# Patient Record
Sex: Female | Born: 1940
Health system: Southern US, Community
[De-identification: ages and names within clinical notes are randomized; demographics above are authoritative.]

## PROBLEM LIST (undated history)

## (undated) DIAGNOSIS — E785 Hyperlipidemia, unspecified: Secondary | ICD-10-CM

## (undated) DIAGNOSIS — Z8619 Personal history of other infectious and parasitic diseases: Secondary | ICD-10-CM

## (undated) DIAGNOSIS — M8580 Other specified disorders of bone density and structure, unspecified site: Secondary | ICD-10-CM

## (undated) DIAGNOSIS — D649 Anemia, unspecified: Secondary | ICD-10-CM

## (undated) DIAGNOSIS — M1990 Unspecified osteoarthritis, unspecified site: Secondary | ICD-10-CM

## (undated) DIAGNOSIS — C801 Malignant (primary) neoplasm, unspecified: Secondary | ICD-10-CM

## (undated) DIAGNOSIS — I10 Essential (primary) hypertension: Secondary | ICD-10-CM

## (undated) DIAGNOSIS — F32A Depression, unspecified: Secondary | ICD-10-CM

## (undated) DIAGNOSIS — F329 Major depressive disorder, single episode, unspecified: Secondary | ICD-10-CM

## (undated) DIAGNOSIS — Z972 Presence of dental prosthetic device (complete) (partial): Secondary | ICD-10-CM

## (undated) DIAGNOSIS — Z8541 Personal history of malignant neoplasm of cervix uteri: Secondary | ICD-10-CM

## (undated) HISTORY — PX: OTHER SURGICAL HISTORY: SHX169

## (undated) HISTORY — DX: Other specified disorders of bone density and structure, unspecified site: M85.80

## (undated) HISTORY — PX: ROTATOR CUFF REPAIR: SHX139

## (undated) HISTORY — PX: COLONOSCOPY: SHX174

## (undated) HISTORY — PX: REPLACEMENT TOTAL KNEE: SUR1224

## (undated) HISTORY — DX: Essential (primary) hypertension: I10

## (undated) HISTORY — DX: Unspecified osteoarthritis, unspecified site: M19.90

## (undated) HISTORY — PX: OOPHORECTOMY: SHX86

## (undated) HISTORY — DX: Personal history of malignant neoplasm of cervix uteri: Z85.41

## (undated) HISTORY — DX: Major depressive disorder, single episode, unspecified: F32.9

## (undated) HISTORY — DX: Personal history of other infectious and parasitic diseases: Z86.19

## (undated) HISTORY — DX: Hyperlipidemia, unspecified: E78.5

## (undated) HISTORY — DX: Depression, unspecified: F32.A

---

## 1975-07-04 HISTORY — PX: APPENDECTOMY: SHX54

## 1975-07-04 HISTORY — PX: ABDOMINAL HYSTERECTOMY: SHX81

## 2001-03-05 ENCOUNTER — Other Ambulatory Visit: Admission: RE | Admit: 2001-03-05 | Discharge: 2001-03-05 | Payer: Self-pay | Admitting: Family Medicine

## 2005-09-27 ENCOUNTER — Ambulatory Visit: Payer: Self-pay | Admitting: Family Medicine

## 2006-10-01 ENCOUNTER — Ambulatory Visit: Payer: Self-pay | Admitting: Family Medicine

## 2007-05-21 ENCOUNTER — Ambulatory Visit: Payer: Self-pay | Admitting: Rheumatology

## 2007-10-29 ENCOUNTER — Ambulatory Visit: Payer: Self-pay | Admitting: Family Medicine

## 2008-10-29 DIAGNOSIS — E663 Overweight: Secondary | ICD-10-CM | POA: Insufficient documentation

## 2008-11-24 ENCOUNTER — Ambulatory Visit: Payer: Self-pay | Admitting: Family Medicine

## 2008-11-24 DIAGNOSIS — M810 Age-related osteoporosis without current pathological fracture: Secondary | ICD-10-CM | POA: Insufficient documentation

## 2008-12-01 ENCOUNTER — Ambulatory Visit: Payer: Self-pay | Admitting: Family Medicine

## 2009-12-15 ENCOUNTER — Ambulatory Visit: Payer: Self-pay | Admitting: Family Medicine

## 2011-01-24 ENCOUNTER — Ambulatory Visit: Payer: Self-pay | Admitting: Family Medicine

## 2011-01-24 LAB — HM DEXA SCAN: HM DEXA SCAN: NORMAL

## 2012-01-29 ENCOUNTER — Ambulatory Visit: Payer: Self-pay | Admitting: Family Medicine

## 2013-02-04 ENCOUNTER — Ambulatory Visit: Payer: Self-pay | Admitting: Family Medicine

## 2013-11-06 DIAGNOSIS — Z9889 Other specified postprocedural states: Secondary | ICD-10-CM | POA: Insufficient documentation

## 2013-11-06 DIAGNOSIS — M1990 Unspecified osteoarthritis, unspecified site: Secondary | ICD-10-CM | POA: Insufficient documentation

## 2014-03-12 LAB — LIPID PANEL
CHOLESTEROL: 188 mg/dL (ref 0–200)
HDL: 57 mg/dL (ref 35–70)
LDL CALC: 102 mg/dL
TRIGLYCERIDES: 143 mg/dL (ref 40–160)

## 2014-03-12 LAB — BASIC METABOLIC PANEL
BUN: 8 mg/dL (ref 4–21)
Creatinine: 0.8 mg/dL (ref 0.5–1.1)
Glucose: 95 mg/dL
Potassium: 4.2 mmol/L (ref 3.4–5.3)
Sodium: 145 mmol/L (ref 137–147)

## 2014-04-06 ENCOUNTER — Ambulatory Visit: Payer: Self-pay | Admitting: Unknown Physician Specialty

## 2014-04-06 LAB — HM COLONOSCOPY

## 2014-04-14 ENCOUNTER — Ambulatory Visit: Payer: Self-pay | Admitting: Family Medicine

## 2014-04-14 LAB — HM MAMMOGRAPHY

## 2014-04-28 ENCOUNTER — Ambulatory Visit: Payer: Self-pay | Admitting: Family Medicine

## 2014-08-26 DIAGNOSIS — J069 Acute upper respiratory infection, unspecified: Secondary | ICD-10-CM | POA: Diagnosis not present

## 2014-11-05 DIAGNOSIS — M150 Primary generalized (osteo)arthritis: Secondary | ICD-10-CM | POA: Diagnosis not present

## 2014-11-05 DIAGNOSIS — G8929 Other chronic pain: Secondary | ICD-10-CM | POA: Diagnosis not present

## 2014-11-05 DIAGNOSIS — M25561 Pain in right knee: Secondary | ICD-10-CM | POA: Diagnosis not present

## 2014-12-17 DIAGNOSIS — L0201 Cutaneous abscess of face: Secondary | ICD-10-CM | POA: Diagnosis not present

## 2014-12-23 DIAGNOSIS — M25562 Pain in left knee: Secondary | ICD-10-CM | POA: Diagnosis not present

## 2014-12-23 DIAGNOSIS — M150 Primary generalized (osteo)arthritis: Secondary | ICD-10-CM | POA: Diagnosis not present

## 2014-12-23 DIAGNOSIS — G8929 Other chronic pain: Secondary | ICD-10-CM | POA: Diagnosis not present

## 2014-12-29 DIAGNOSIS — F32A Depression, unspecified: Secondary | ICD-10-CM | POA: Insufficient documentation

## 2014-12-29 DIAGNOSIS — Z8541 Personal history of malignant neoplasm of cervix uteri: Secondary | ICD-10-CM | POA: Insufficient documentation

## 2014-12-29 DIAGNOSIS — R5383 Other fatigue: Secondary | ICD-10-CM | POA: Insufficient documentation

## 2014-12-29 DIAGNOSIS — F329 Major depressive disorder, single episode, unspecified: Secondary | ICD-10-CM | POA: Insufficient documentation

## 2014-12-29 DIAGNOSIS — E785 Hyperlipidemia, unspecified: Secondary | ICD-10-CM | POA: Insufficient documentation

## 2014-12-29 DIAGNOSIS — N611 Abscess of the breast and nipple: Secondary | ICD-10-CM | POA: Insufficient documentation

## 2014-12-29 HISTORY — DX: Personal history of malignant neoplasm of cervix uteri: Z85.41

## 2014-12-30 ENCOUNTER — Encounter: Payer: Self-pay | Admitting: Family Medicine

## 2014-12-30 ENCOUNTER — Ambulatory Visit (INDEPENDENT_AMBULATORY_CARE_PROVIDER_SITE_OTHER): Payer: Medicare Other | Admitting: Family Medicine

## 2014-12-30 VITALS — BP 146/80 | HR 82 | Temp 98.4°F | Resp 16 | Wt 217.0 lb

## 2014-12-30 DIAGNOSIS — F419 Anxiety disorder, unspecified: Secondary | ICD-10-CM

## 2014-12-30 DIAGNOSIS — G4700 Insomnia, unspecified: Secondary | ICD-10-CM | POA: Diagnosis not present

## 2014-12-30 DIAGNOSIS — F329 Major depressive disorder, single episode, unspecified: Secondary | ICD-10-CM | POA: Diagnosis not present

## 2014-12-30 DIAGNOSIS — F32A Depression, unspecified: Secondary | ICD-10-CM

## 2014-12-30 MED ORDER — TRAZODONE HCL 100 MG PO TABS: ORAL_TABLET | ORAL | Status: DC

## 2014-12-30 MED ORDER — SERTRALINE HCL 50 MG PO TABS: ORAL_TABLET | ORAL | Status: DC

## 2014-12-30 NOTE — Progress Notes (Signed)
Patient: Lynn Mccoy Female    DOB: 28-Jan-1941   74 y.o.   MRN: [id number] Visit Date: 12/30/2014  Today's Provider: [name], MD   Chief Complaint  Patient presents with  . Depression  . Anxiety   Subjective:    HPI  Depression, Follow-up  She  was last seen for this 4 months ago. Changes made at last visit include none.   She reports poor compliance with treatment. She is not having side effects. She stopped taking her Prozac several months. Helped with mood in the past, but stopped a few months ago because she said it was not working for her.  Current symptoms include: depressed mood, fatigue and insomnia She feels she is Unchanged since last visit.  She reports that she does not want to leave the house at all and will stay at home and do nothing and that is not like her. She reports that she has been this way since her sister died in 09-Jul-2023. Pt states " I just don't handle death well". She would like to try something different.   ------------------------------------------------------------------------   Insomnia She states she falls asleep readily at night, but wakes after just an hour or two an is unable to get back to sleep. Is not kept up by pain, shortness of breath, or heart racing, but can't seem to relax and clear her mind.     Previous Medications   ASPIRIN 81 MG CHEWABLE TABLET    Chew 1 tablet by mouth daily.   CALCIUM CARBONATE 1500 (600 CA) MG TABS    Take 1 tablet by mouth daily.   CHOLECALCIFEROL (VITAMIN D) 1000 UNITS TABLET    Take 1 tablet by mouth daily.   CYANOCOBALAMIN (VITAMIN B 12) 100 MCG LOZG    Take 1 tablet by mouth daily.   FLUTICASONE (FLONASE) 50 MCG/ACT NASAL SPRAY    Place 2 sprays into both nostrils daily.   LORATADINE (CLARITIN) 10 MG TABLET    Take 1 tablet by mouth daily as needed.    Review of Systems  Constitutional: Positive for fatigue.  HENT: Negative.   Eyes: Negative.   Respiratory: Negative.     Cardiovascular: Negative.   Gastrointestinal: Negative.   Endocrine: Negative.   Genitourinary: Negative.   Musculoskeletal: Positive for arthralgias.  Skin: Negative.   Allergic/Immunologic: Negative.   Neurological: Negative.   Hematological: Negative.   Psychiatric/Behavioral: Positive for sleep disturbance and dysphoric mood. The patient is nervous/anxious.        History  Substance Use Topics  . Smoking status: Never Smoker   . Smokeless tobacco: Not on file  . Alcohol Use: 0.6 oz/week    1 Standard drinks or equivalent per week     Comment: occasional use   Objective:   BP 146/80 mmHg  Pulse 82  Temp(Src) 98.4 F (36.9 C) (Oral)  Resp 16  Wt 217 lb (98.431 kg)  Physical Exam   General Appearance:    Alert, cooperative, no distress  Eyes:    PERRL, conjunctiva/corneas clear, EOM's intact       Lungs:     Clear to auscultation bilaterally, respirations unlabored  Heart:    Regular rate and rhythm  Neurologic:   Awake, alert, oriented x 3. No apparent focal neurological           defect.           Assessment & Plan:     1. Depression Inadequate response to  fluoxetine. Failed lexapro in the past.  - sertraline (ZOLOFT) 50 MG tablet; 1/2 tablet every day for 6 days, then increase to 1 tablet daily  Dispense: 30 tablet; Refill: 1  2. Insomnia  - traZODone (DESYREL) 100 MG tablet; 1/2 or 1 tablet 1 hour before bedtime  Dispense: 30 tablet; Refill: 1  3. Acute anxiety  - sertraline (ZOLOFT) 50 MG tablet; 1/2 tablet every day for 6 days, then increase to 1 tablet daily  Dispense: 30 tablet; Refill: 1   Follow up: Return in about 1 month (around 01/29/2015).      [name], MD  [facility]

## 2015-01-22 ENCOUNTER — Encounter: Payer: Self-pay | Admitting: Family Medicine

## 2015-01-22 ENCOUNTER — Ambulatory Visit (INDEPENDENT_AMBULATORY_CARE_PROVIDER_SITE_OTHER): Payer: Medicare Other | Admitting: Family Medicine

## 2015-01-22 VITALS — BP 130/70 | HR 68 | Temp 97.9°F | Resp 18 | Wt 210.0 lb

## 2015-01-22 DIAGNOSIS — F329 Major depressive disorder, single episode, unspecified: Secondary | ICD-10-CM | POA: Diagnosis not present

## 2015-01-22 DIAGNOSIS — J309 Allergic rhinitis, unspecified: Secondary | ICD-10-CM | POA: Insufficient documentation

## 2015-01-22 DIAGNOSIS — G4700 Insomnia, unspecified: Secondary | ICD-10-CM | POA: Diagnosis not present

## 2015-01-22 DIAGNOSIS — F32A Depression, unspecified: Secondary | ICD-10-CM

## 2015-01-22 DIAGNOSIS — F419 Anxiety disorder, unspecified: Secondary | ICD-10-CM

## 2015-01-22 MED ORDER — MONTELUKAST SODIUM 10 MG PO TABS: 10.0000 mg | ORAL_TABLET | Freq: Every day | ORAL | Status: DC

## 2015-01-22 MED ORDER — TRAZODONE HCL 100 MG PO TABS: ORAL_TABLET | ORAL | Status: DC

## 2015-01-22 MED ORDER — SERTRALINE HCL 50 MG PO TABS: 50.0000 mg | ORAL_TABLET | Freq: Every day | ORAL | Status: DC

## 2015-01-22 NOTE — Progress Notes (Signed)
Patient: Lynn Mccoy Female    DOB: 17-Jun-1941   74 y.o.   MRN: [id number] Visit Date: 01/22/2015  Today's Provider: [name], MD   Chief Complaint  Patient presents with  . Depression    follow up  . Anxiety    follow up  . Insomnia    follow up   Subjective:    Anxiety The problem has been gradually improving. Symptoms include dry mouth, insomnia, malaise, nervous/anxious behavior and restlessness (fidgity). Patient reports no chest pain, compulsions, confusion, decreased concentration, dizziness, excessive worry, feeling of choking, hyperventilation, irritability, nausea, palpitations, panic, shortness of breath or suicidal ideas. Depressed mood: improved since last visit. The quality of sleep is good.      Depression Follow up She was prescribed sertraline at her last visit a month ago for depression and anxiety. She states her mood has been much better, is less anxious. She is still somewhat fatigued during the day, but improved.    Insomnia  She presents today for evaluation of insomnia. Onset was 1 months ago. Insomnia is getting better.  She does not have difficulty FALLING asleep. She does not have difficulty STAYING asleep. She does not wake frequently to urinate. She does not have urge to move legs when resting. She is not having pain when trying to sleep  She is having anxiety. She is having a lot of stress in her life. . She is having depression.   She is not taking OTC sleeping aid. . She is taking medications to help sleep. She is not drinking alcohol to help sleep. She is not using illicit drugs. Patient was started on Trazodone during the last office visit to help with Insomnia. Patient reports good compliance with treatment, good tolerance and good symptom control. Patient has been taking 1/2 tablet of Trazodone at bedtime.  --------------------------------------------------------------------   Allergies She states she  recently went on a cruise and ever since she has stayed stuffed up with watery eyes and nasal congestion. She has been using Flonase which did not control symptoms. She started taking generic of Claritin D which has helped, but she has been feeling 'fidgety.'     No Known Allergies Previous Medications   ASPIRIN 81 MG CHEWABLE TABLET    Chew 1 tablet by mouth daily.   CALCIUM CARBONATE 1500 (600 CA) MG TABS    Take 1 tablet by mouth daily.   CHOLECALCIFEROL (VITAMIN D) 1000 UNITS TABLET    Take 1 tablet by mouth daily.   CYANOCOBALAMIN (VITAMIN B 12) 100 MCG LOZG    Take 1 tablet by mouth daily.   FLUTICASONE (FLONASE) 50 MCG/ACT NASAL SPRAY    Place 2 sprays into both nostrils daily.   LORATADINE (CLARITIN) 10 MG TABLET    Take 1 tablet by mouth daily as needed.   SERTRALINE (ZOLOFT) 50 MG TABLET    1/2 tablet every day for 6 days, then increase to 1 tablet daily   TRAZODONE (DESYREL) 100 MG TABLET    1/2 or 1 tablet 1 hour before bedtime    Review of Systems  Constitutional: Positive for fatigue. Negative for fever, chills and irritability.  HENT: Positive for congestion, postnasal drip and sneezing. Negative for ear pain, sinus pressure, sore throat and trouble swallowing.   Respiratory: Negative for cough, chest tightness and shortness of breath.   Cardiovascular: Negative for chest pain and palpitations.  Gastrointestinal: Negative for nausea.  Neurological: Negative for dizziness.  Psychiatric/Behavioral: Negative for  suicidal ideas, confusion and decreased concentration. The patient is nervous/anxious and has insomnia.     History  Substance Use Topics  . Smoking status: Never Smoker   . Smokeless tobacco: Not on file  . Alcohol Use: 0.6 oz/week    1 Standard drinks or equivalent per week     Comment: occasional use   Objective:   BP 130/70 mmHg  Pulse 68  Temp(Src) 97.9 F (36.6 C) (Oral)  Resp 18  Wt 210 lb (95.255 kg)  Physical Exam  General Appearance:    Alert,  cooperative, no distress  HENT:   bilateral TM normal without fluid or infection, sinuses nontender and post nasal drip noted  Eyes:    PERRL, conjunctiva/corneas clear, EOM's intact       Lungs:     Clear to auscultation bilaterally, respirations unlabored  Heart:    Regular rate and rhythm  Neurologic:   Awake, alert, oriented x 3. No apparent focal neurological           defect.           Assessment & Plan:      1. Depression Much better since starting sertraline last month. Continue current medications.   - sertraline (ZOLOFT) 50 MG tablet; Take 1 tablet (50 mg total) by mouth daily.  Dispense: 30 tablet; Refill: 5  2. Insomnia Trazodone working well - traZODone (DESYREL) 100 MG tablet; 1/2 or 1 tablet 1 hour before bedtime  Dispense: 30 tablet; Refill: 3  3. Allergic rhinitis, unspecified allergic rhinitis type Counseled that OTC oral decongestants can cause anxiety, nervousness, and may cause BP and heart problems. Will try generic singulair instead.   4. Acute anxiety Doing well on sertraline.  - sertraline (ZOLOFT) 50 MG tablet; Take 1 tablet (50 mg total) by mouth daily.  Dispense: 30 tablet; Refill: 5  Return in about 4 months (around 05/25/2015) for Yearly Physical.       [name], MD  [facility]

## 2015-03-24 ENCOUNTER — Ambulatory Visit (INDEPENDENT_AMBULATORY_CARE_PROVIDER_SITE_OTHER): Payer: Medicare Other | Admitting: Family Medicine

## 2015-03-24 ENCOUNTER — Other Ambulatory Visit: Payer: Self-pay | Admitting: Family Medicine

## 2015-03-24 ENCOUNTER — Encounter: Payer: Self-pay | Admitting: Family Medicine

## 2015-03-24 VITALS — BP 140/62 | HR 76 | Temp 98.2°F | Resp 16 | Ht 65.5 in | Wt 211.0 lb

## 2015-03-24 DIAGNOSIS — Z0000 Encounter for general adult medical examination without abnormal findings: Secondary | ICD-10-CM

## 2015-03-24 DIAGNOSIS — Z13220 Encounter for screening for lipoid disorders: Secondary | ICD-10-CM

## 2015-03-24 DIAGNOSIS — F32A Depression, unspecified: Secondary | ICD-10-CM

## 2015-03-24 DIAGNOSIS — F329 Major depressive disorder, single episode, unspecified: Secondary | ICD-10-CM | POA: Diagnosis not present

## 2015-03-24 DIAGNOSIS — Z1231 Encounter for screening mammogram for malignant neoplasm of breast: Secondary | ICD-10-CM

## 2015-03-24 NOTE — Patient Instructions (Signed)
   Please contact your eyecare professional to schedule a routine eye exam  

## 2015-03-24 NOTE — Progress Notes (Signed)
Patient: Lynn Mccoy, Female    DOB: Nov 19, 1940, 74 y.o.   MRN: [id number] Visit Date: 03/24/2015  Today's Lynn Mccoy: Lynn Huh, MD   Chief Complaint  Patient presents with  . Annual Exam  . Hyperlipidemia    follow up  . Depression    follow up  . Insomnia    follow up   Subjective:    Annual wellness visit Lynn Mccoy is a 74 y.o. female. She feels fairly well. She reports exercising daily. She reports she is sleeping fairly well.  -----------------------------------------------------------  Lipid/Cholesterol, Follow-up:   Last seen for this1 years ago.  Management changes since that visit include  none. . Last Lipid Panel:    Component Value Date/Time   CHOL 188 03/12/2014   TRIG 143 03/12/2014   HDL 57 03/12/2014   LDLCALC 102 03/12/2014    Risk factors for vascular disease include hypercholesterolemia  She reports good compliance with treatment. She is not having side effects.  Current symptoms include none and have been stable. Weight trend: stable Prior visit with dietician: no Current diet: in general, an "unhealthy" diet Current exercise: daily  Wt Readings from Last 3 Encounters:  01/22/15 210 lb (95.255 kg)  12/30/14 217 lb (98.431 kg)  08/26/14 214 lb (97.07 kg)    ------------------------------------------------------------------- Osteopenia Follow up: Last office visit was 1 year ago. During that visit labs were ordered and were normal. Bone Density test was also ordered. Patient currently takes Vitamin D 1,000 units daily. Patient stopped taking Calcium because she read that it could cause Alzheimer's Disease.   Depression follow up: Last office visit was 2 months ago and no changes were made. Patient reports good compliance with treatment, good tolerance and good symptom control.   Review of Systems  Constitutional: Negative for fever, chills and fatigue.  HENT: Negative for congestion, ear pain, rhinorrhea,  sneezing and sore throat.   Eyes: Negative.  Negative for pain and redness.  Respiratory: Negative for cough, shortness of breath and wheezing.   Cardiovascular: Positive for leg swelling (left leg). Negative for chest pain.  Gastrointestinal: Negative for nausea, abdominal pain, diarrhea, constipation and blood in stool.  Endocrine: Negative for polydipsia and polyphagia.  Genitourinary: Negative.  Negative for dysuria, hematuria, flank pain, vaginal bleeding, vaginal discharge and pelvic pain.  Musculoskeletal: Positive for arthralgias. Negative for back pain, joint swelling and gait problem.  Skin: Negative for rash.  Neurological: Negative.  Negative for dizziness, tremors, seizures, weakness, light-headedness, numbness and headaches.  Hematological: Negative for adenopathy.  Psychiatric/Behavioral: Negative.  Negative for behavioral problems, confusion and dysphoric mood. The patient is not nervous/anxious and is not hyperactive.     Social History   Social History  . Marital Status: Widowed    Spouse Name: N/A  . Number of Children: 3  . Years of Education: 12   Occupational History  . Retired     former Haematologist   Social History Main Topics  . Smoking status: Never Smoker   . Smokeless tobacco: Not on file  . Alcohol Use: 0.6 oz/week    1 Standard drinks or equivalent per week     Comment: occasional use  . Drug Use: No  . Sexual Activity: Not on file   Other Topics Concern  . Not on file   Social History Narrative    Patient Active Problem List   Diagnosis Date Noted  . Allergic rhinitis 01/22/2015  . Depression 12/29/2014  . Fatigue 12/29/2014  .  Hyperlipidemia 12/29/2014  . Insomnia 12/06/2009  . Adjustment disorder with depressed mood 01/13/2009  . Osteopenia 11/24/2008  . Diverticulosis of colon without hemorrhage 10/29/2008  . Obesity 10/29/2008  . Osteoarthrosis 10/29/2008    Past Surgical History  Procedure Laterality Date  . Abdominal  hysterectomy  1977    due to cervical cancer stage 4  . Appendectomy  1977    Her family history includes Alzheimer's disease in her sister; COPD in her sister; Dementia in her sister; Heart attack in her brother; Leukemia in her mother; Lung cancer in her father; Rheum arthritis in her sister.    Previous Medications   ASPIRIN 81 MG CHEWABLE TABLET    Chew 1 tablet by mouth daily.   CALCIUM CARBONATE 1500 (600 CA) MG TABS    Take 1 tablet by mouth daily.   CHOLECALCIFEROL (VITAMIN D) 1000 UNITS TABLET    Take 1 tablet by mouth daily.   CYANOCOBALAMIN (VITAMIN B 12) 100 MCG LOZG    Take 1 tablet by mouth daily.   FLUTICASONE (FLONASE) 50 MCG/ACT NASAL SPRAY    Place 2 sprays into both nostrils daily.   LORATADINE (CLARITIN) 10 MG TABLET    Take 1 tablet by mouth daily as needed.   MONTELUKAST (SINGULAIR) 10 MG TABLET    Take 1 tablet (10 mg total) by mouth at bedtime.   SERTRALINE (ZOLOFT) 50 MG TABLET    Take 1 tablet (50 mg total) by mouth daily.   TRAZODONE (DESYREL) 100 MG TABLET    1/2 or 1 tablet 1 hour before bedtime    Patient Care Team: Birdie Sons, MD as PCP - General (Family Medicine) Leanor Kail, MD (Unknown Physician Specialty)     Objective:   Vitals: BP 140/62 mmHg  Pulse 76  Temp(Src) 98.2 F (36.8 C) (Oral)  Resp 16  Ht 5' 5.5" (1.664 m)  Wt 211 lb (95.709 kg)  BMI 34.57 kg/m2  Physical Exam   General Appearance:    Alert, cooperative, no distress, appears stated age  Head:    Normocephalic, without obvious abnormality, atraumatic  Eyes:    PERRL, conjunctiva/corneas clear, EOM's intact, fundi    benign, both eyes  Ears:    Normal TM's and external ear canals, both ears  Nose:   Nares normal, septum midline, mucosa normal, no drainage    or sinus tenderness  Throat:   Lips, mucosa, and tongue normal; teeth and gums normal  Neck:   Supple, symmetrical, trachea midline, no adenopathy;    thyroid:  no enlargement/tenderness/nodules; no carotid    bruit or JVD  Back:     Symmetric, no curvature, ROM normal, no CVA tenderness  Lungs:     Clear to auscultation bilaterally, respirations unlabored  Chest Wall:    No tenderness or deformity   Heart:    Regular rate and rhythm, S1 and S2 normal, no murmur, rub   or gallop  Breast Exam:    normal appearance, no masses or tenderness, deferred  Abdomen:     Soft, non-tender, bowel sounds active all four quadrants,    no masses, no organomegaly  Pelvic:    deferred  Extremities:   Extremities normal, atraumatic, no cyanosis or edema  Pulses:   2+ and symmetric all extremities  Skin:   Skin color, texture, turgor normal, no rashes or lesions  Lymph nodes:   Cervical, supraclavicular, and axillary nodes normal  Neurologic:   CNII-XII intact, normal strength, sensation and reflexes  throughout    Activities of Daily Living In your present state of health, do you have any difficulty performing the following activities: 03/24/2015  Hearing? N  Vision? N  Difficulty concentrating or making decisions? N  Walking or climbing stairs? Y  Dressing or bathing? N  Doing errands, shopping? N    Fall Risk Assessment Fall Risk  03/24/2015  Falls in the past year? No     Depression Screen PHQ 2/9 Scores 03/24/2015  PHQ - 2 Score 0  PHQ- 9 Score 3    Cognitive Testing - 6-CIT  Correct? Score   What year is it? yes 0 0 or 4  What month is it? yes 0 0 or 3  Memorize:    Pia Mau,  41,  [place],      What time is it? (within 1 hour) yes 0 0 or 3  Count backwards from 20 yes 0 0, 2, or 4  Name the months of the year yes 0 0, 2, or 4  Repeat name & address above no 8 0, 2, 4, 6, 8, or 10       TOTAL SCORE  8/28   Interpretation:  Abnormal- Mild Cognitive Impairment (Consider Referral)  Normal (0-7) Abnormal (8-28)     Audit-C Alcohol Use Screening  Question Answer Points  How often do you have alcoholic drink? 1 times monthly 1  How many drinks do you typically consume  in a day? 0 0  How oftey will you drink 6 or more in a total? never 0  Total Score:  0   A score of 3 or more in women, and 4 or more in men indicates increased risk for alcohol abuse, EXCEPT if all of the points are from question 1.   Assessment & Plan:     Annual exam  Reviewed patient's Family Medical History Reviewed and updated list of patient's medical providers Assessment of cognitive impairment was done Assessed patient's functional ability Established a written schedule for health screening [place] Completed and Reviewed  Exercise Activities and Dietary recommendations Goals    None      Immunization History  Administered Date(s) Administered  . Pneumococcal Conjugate-13 03/12/2014  . Pneumococcal Polysaccharide-23 03/22/2005  . Td 01/19/1997, 11/23/2008  . Zoster 10/28/2007    Health Maintenance  Topic Date Due  . INFLUENZA VACCINE  10/02/2015 (Originally 02/01/2015)  . TETANUS/TDAP  11/24/2018  . COLONOSCOPY  04/06/2024  . DEXA SCAN  Completed  . ZOSTAVAX  Completed  . PNA vac Low Risk Adult  Completed      Discussed health benefits of physical activity, and encouraged her to engage in regular exercise appropriate for her age and condition.    ------------------------------------------------------------------------------------------------------------  1. Annual physical exam Generally doing well. May have mild cognitive impairment. Will monitor periodically.   She declined flu vaccine  2. Lipid screening  - Lipid Panel With LDL/HDL Ratio  3. Depression Doing well on sertraline.  - Basic Metabolic Panel (7)

## 2015-03-25 LAB — LIPID PANEL WITH LDL/HDL RATIO
CHOLESTEROL TOTAL: 169 mg/dL (ref 100–199)
HDL: 56 mg/dL (ref >39)
LDL CALC: 94 mg/dL (ref 0–99)
LDl/HDL Ratio: 1.7 ratio units (ref 0.0–3.2)
Triglycerides: 95 mg/dL (ref 0–149)
VLDL Cholesterol Cal: 19 mg/dL (ref 5–40)

## 2015-03-25 LAB — BASIC METABOLIC PANEL (7)
BUN/Creatinine Ratio: 20 (ref 11–26)
BUN: 14 mg/dL (ref 8–27)
CHLORIDE: 102 mmol/L (ref 97–108)
CO2: 25 mmol/L (ref 18–29)
Creatinine, Ser: 0.71 mg/dL (ref 0.57–1.00)
GFR calc Af Amer: 96 mL/min/{1.73_m2} (ref >59)
GFR, EST NON AFRICAN AMERICAN: 84 mL/min/{1.73_m2} (ref >59)
Glucose: 90 mg/dL (ref 65–99)
Potassium: 4.4 mmol/L (ref 3.5–5.2)
Sodium: 143 mmol/L (ref 134–144)

## 2015-03-26 ENCOUNTER — Other Ambulatory Visit: Payer: Self-pay | Admitting: Family Medicine

## 2015-04-16 ENCOUNTER — Other Ambulatory Visit: Payer: Self-pay | Admitting: Family Medicine

## 2015-04-16 ENCOUNTER — Ambulatory Visit
Admission: RE | Admit: 2015-04-16 | Discharge: 2015-04-16 | Disposition: A | Discharge status: Home / Self Care | Payer: Medicare Other | Source: Ambulatory Visit | Attending: Family Medicine | Admitting: Family Medicine

## 2015-04-16 DIAGNOSIS — Z1231 Encounter for screening mammogram for malignant neoplasm of breast: Secondary | ICD-10-CM | POA: Insufficient documentation

## 2015-04-16 DIAGNOSIS — Z1239 Encounter for other screening for malignant neoplasm of breast: Secondary | ICD-10-CM

## 2015-04-16 HISTORY — DX: Malignant (primary) neoplasm, unspecified: C80.1

## 2015-04-29 ENCOUNTER — Ambulatory Visit (INDEPENDENT_AMBULATORY_CARE_PROVIDER_SITE_OTHER): Payer: Medicare Other | Admitting: Physician Assistant

## 2015-04-29 ENCOUNTER — Encounter: Payer: Self-pay | Admitting: Physician Assistant

## 2015-04-29 VITALS — BP 130/72 | HR 80 | Temp 98.4°F | Resp 16 | Wt 210.8 lb

## 2015-04-29 DIAGNOSIS — L02439 Carbuncle of limb, unspecified: Secondary | ICD-10-CM | POA: Diagnosis not present

## 2015-04-29 MED ORDER — CEPHALEXIN 500 MG PO CAPS: 500.0000 mg | ORAL_CAPSULE | Freq: Two times a day (BID) | ORAL | Status: DC

## 2015-04-29 NOTE — Patient Instructions (Signed)

## 2015-04-29 NOTE — Progress Notes (Signed)
Patient: Lynn Mccoy Female    DOB: 15-Feb-1941   74 y.o.   MRN: [id number] Visit Date: 04/29/2015  Today's Provider: Mar Daring, PA-C   Chief Complaint  Patient presents with  . Knot under arm   Subjective:    HPI  Patient Lynn Mccoy is a 74 year old here concern about a knot under the left arm. Patient noticed on Monday. Patient report that is hurting, hot to touch. No fever, no drainage from the cyst, no cold symptoms. Per patient noticed a knot onn her left breast on Tuesday of last week, the next day patient was in the shower and noticed pus coming out from the cyst on her breast.  He was evaluated and told that the area on the left breast was an infected hair follicle. It has been healing and doing well. She did not go on any antibiotics for it.    No Known Allergies Previous Medications   ASPIRIN 81 MG CHEWABLE TABLET    Chew 1 tablet by mouth daily.   CALCIUM CARBONATE 1500 (600 CA) MG TABS    Take 1 tablet by mouth daily.   CHOLECALCIFEROL (VITAMIN D) 1000 UNITS TABLET    Take 1 tablet by mouth daily.   CYANOCOBALAMIN (VITAMIN B 12) 100 MCG LOZG    Take 1 tablet by mouth daily.   FLUTICASONE (FLONASE) 50 MCG/ACT NASAL SPRAY    Place 2 sprays into both nostrils daily.   LORATADINE (CLARITIN) 10 MG TABLET    Take 1 tablet by mouth daily as needed.   MONTELUKAST (SINGULAIR) 10 MG TABLET    TAKE ONE TABLET BY MOUTH EVERY NIGHT AT BEDTIME   SERTRALINE (ZOLOFT) 50 MG TABLET    Take 1 tablet (50 mg total) by mouth daily.   TRAZODONE (DESYREL) 100 MG TABLET    1/2 or 1 tablet 1 hour before bedtime    Review of Systems  Constitutional: Negative for fever and chills.  HENT: Negative for congestion, postnasal drip and sinus pressure.   Respiratory: Negative for cough, chest tightness and wheezing.   Cardiovascular: Negative for chest pain, palpitations and leg swelling.  Gastrointestinal: Negative for nausea and vomiting.  Musculoskeletal: Negative  for myalgias, joint swelling, neck pain and neck stiffness.  Skin: Positive for color change (erythematous at site of L axilla bump).  Neurological: Negative for dizziness, light-headedness and headaches.  Hematological: Negative.     Social History  Substance Use Topics  . Smoking status: Never Smoker   . Smokeless tobacco: Not on file  . Alcohol Use: 0.6 oz/week    1 Standard drinks or equivalent per week     Comment: occasional use   Objective:   BP 130/72 mmHg  Pulse 80  Temp(Src) 98.4 F (36.9 C) (Oral)  Resp 16  Wt 210 lb 12.8 oz (95.618 kg)  Physical Exam  Constitutional: She appears well-developed and well-nourished. No distress.  Cardiovascular: Normal rate, regular rhythm and normal heart sounds.  Exam reveals no gallop and no friction rub.   No murmur heard. Pulmonary/Chest: Effort normal and breath sounds normal. No respiratory distress. She has no wheezes. She has no rales.    Abdominal: Soft. Bowel sounds are normal. She exhibits no distension. There is no tenderness.  Skin: Skin is warm and dry. She is not diaphoretic. There is erythema (left axilla).  Vitals reviewed.       Assessment & Plan:     1. Carbuncle of  axillary fold Advised her that she may put warm compresses over the area to help with discomfort. I will prescribe Keflex as below. She is to call the office if the area does improve by the time she is completing the antibiotic. If no improvement may require I&D. - cephALEXin (KEFLEX) 500 MG capsule; Take 1 capsule (500 mg total) by mouth 2 (two) times daily.  Dispense: 20 capsule; Refill: 0       Mar Daring, PA-C  [facility]

## 2015-08-10 ENCOUNTER — Other Ambulatory Visit: Payer: Self-pay | Admitting: Family Medicine

## 2015-10-21 ENCOUNTER — Other Ambulatory Visit: Payer: Self-pay | Admitting: Family Medicine

## 2015-11-01 ENCOUNTER — Ambulatory Visit (INDEPENDENT_AMBULATORY_CARE_PROVIDER_SITE_OTHER): Payer: Medicare Other | Admitting: Family Medicine

## 2015-11-01 ENCOUNTER — Encounter: Payer: Self-pay | Admitting: Family Medicine

## 2015-11-01 VITALS — BP 140/70 | HR 82 | Temp 98.3°F | Resp 16 | Ht 65.5 in | Wt 213.0 lb

## 2015-11-01 DIAGNOSIS — J309 Allergic rhinitis, unspecified: Secondary | ICD-10-CM | POA: Diagnosis not present

## 2015-11-01 DIAGNOSIS — H1013 Acute atopic conjunctivitis, bilateral: Secondary | ICD-10-CM | POA: Diagnosis not present

## 2015-11-01 MED ORDER — AZELASTINE HCL 0.05 % OP SOLN: 1.0000 [drp] | Freq: Two times a day (BID) | OPHTHALMIC | Status: DC | PRN

## 2015-11-01 MED ORDER — FLUTICASONE PROPIONATE 50 MCG/ACT NA SUSP: 2.0000 | Freq: Every day | NASAL | Status: DC

## 2015-11-01 NOTE — Progress Notes (Signed)
Patient: Lynn Mccoy Female    DOB: 04-Jun-1941   75 y.o.   MRN: [id number] Visit Date: 11/01/2015  Today's Provider: [name], MD   Chief Complaint  Patient presents with  . Eye Drainage   Subjective:     Sinus pressure and runny eyes since Saturday. symptoms include sinus pressure, runny nose and runny eyes. Has taken otc alka-seltzer cold with moderate relief.    Sinus Problem This is a new problem. The current episode started in the past 7 days (2 days). There has been no fever. The pain is mild. Associated symptoms include congestion, coughing, ear pain, sinus pressure and sneezing. Pertinent negatives include no chills, diaphoresis, headaches, hoarse voice, neck pain, shortness of breath, sore throat or swollen glands. Treatments tried: alka seltzer cold. The treatment provided moderate relief.      No Known Allergies Previous Medications   ASPIRIN 81 MG CHEWABLE TABLET    Chew 1 tablet by mouth daily.   CALCIUM CARBONATE 1500 (600 CA) MG TABS    Take 1 tablet by mouth daily.   CHOLECALCIFEROL (VITAMIN D) 1000 UNITS TABLET    Take 1 tablet by mouth daily.   CYANOCOBALAMIN (VITAMIN B 12) 100 MCG LOZG    Take 1 tablet by mouth daily.   FLUTICASONE (FLONASE) 50 MCG/ACT NASAL SPRAY    Place 2 sprays into both nostrils daily.   LORATADINE (CLARITIN) 10 MG TABLET    Take 1 tablet by mouth daily as needed.   MONTELUKAST (SINGULAIR) 10 MG TABLET    TAKE ONE TABLET BY MOUTH EVERY NIGHT AT BEDTIME   SERTRALINE (ZOLOFT) 50 MG TABLET    Take 1 tablet (50 mg total) by mouth daily.   TRAZODONE (DESYREL) 100 MG TABLET    TAKE ONE-HALF (1/2) TO ONE (1) TABLET BY MOUTH ONE HOUR BEFORE BEDTIME    Review of Systems  Constitutional: Negative for fever, chills, diaphoresis, appetite change and fatigue.  HENT: Positive for congestion, ear pain, postnasal drip, sinus pressure and sneezing. Negative for hoarse voice and sore throat.   Eyes: Positive for discharge.  Respiratory:  Positive for cough. Negative for chest tightness and shortness of breath.   Cardiovascular: Negative for chest pain and palpitations.  Gastrointestinal: Negative for nausea, vomiting and abdominal pain.  Musculoskeletal: Negative for neck pain.  Neurological: Negative for dizziness, weakness and headaches.    Social History  Substance Use Topics  . Smoking status: Never Smoker   . Smokeless tobacco: Not on file  . Alcohol Use: 0.6 oz/week    1 Standard drinks or equivalent per week     Comment: occasional use   Objective:   BP 140/70 mmHg  Pulse 82  Temp(Src) 98.3 F (36.8 C) (Oral)  Resp 16  Ht 5' 5.5" (1.664 m)  Wt 213 lb (96.616 kg)  BMI 34.89 kg/m2  SpO2 97%  Physical Exam  General Appearance:    Alert, cooperative, no distress  HENT:   bilateral TM normal without fluid or infection, neck without nodes, pharynx erythematous without exudate and nasal mucosa pale and congested  Eyes:    PERRL, conjunctiva pale. No discharge, EOM's intact       Lungs:     Clear to auscultation bilaterally, respirations unlabored  Heart:    Regular rate and rhythm  Neurologic:   Awake, alert, oriented x 3. No apparent focal neurological           defect.  Assessment & Plan:     1. Allergic rhinitis, unspecified allergic rhinitis type  - fluticasone (FLONASE) 50 MCG/ACT nasal spray; Place 2 sprays into both nostrils daily. For allergies  Dispense: 16 g; Refill: 2  2. Allergic conjunctivitis, bilateral  - azelastine (OPTIVAR) 0.05 % ophthalmic solution; Place 1 drop into both eyes 2 (two) times daily as needed.  Dispense: 6 mL; Refill: 2   Call if symptoms change or if not rapidly improving.          [name], MD  [facility]

## 2015-12-11 ENCOUNTER — Emergency Department: Payer: Medicare Other

## 2015-12-11 ENCOUNTER — Encounter: Payer: Self-pay | Admitting: Emergency Medicine

## 2015-12-11 ENCOUNTER — Emergency Department
Admission: EM | Admit: 2015-12-11 | Discharge: 2015-12-11 | Disposition: A | Discharge status: Home / Self Care | Payer: Medicare Other | Attending: Emergency Medicine | Admitting: Emergency Medicine

## 2015-12-11 DIAGNOSIS — S0992XA Unspecified injury of nose, initial encounter: Secondary | ICD-10-CM | POA: Diagnosis present

## 2015-12-11 DIAGNOSIS — S022XXA Fracture of nasal bones, initial encounter for closed fracture: Secondary | ICD-10-CM | POA: Diagnosis not present

## 2015-12-11 DIAGNOSIS — Z7982 Long term (current) use of aspirin: Secondary | ICD-10-CM | POA: Insufficient documentation

## 2015-12-11 DIAGNOSIS — M1990 Unspecified osteoarthritis, unspecified site: Secondary | ICD-10-CM | POA: Diagnosis not present

## 2015-12-11 DIAGNOSIS — W01190A Fall on same level from slipping, tripping and stumbling with subsequent striking against furniture, initial encounter: Secondary | ICD-10-CM | POA: Diagnosis not present

## 2015-12-11 DIAGNOSIS — S0121XA Laceration without foreign body of nose, initial encounter: Secondary | ICD-10-CM

## 2015-12-11 DIAGNOSIS — Y999 Unspecified external cause status: Secondary | ICD-10-CM | POA: Insufficient documentation

## 2015-12-11 DIAGNOSIS — F329 Major depressive disorder, single episode, unspecified: Secondary | ICD-10-CM | POA: Insufficient documentation

## 2015-12-11 DIAGNOSIS — Z8543 Personal history of malignant neoplasm of ovary: Secondary | ICD-10-CM | POA: Diagnosis not present

## 2015-12-11 DIAGNOSIS — Z8541 Personal history of malignant neoplasm of cervix uteri: Secondary | ICD-10-CM | POA: Diagnosis not present

## 2015-12-11 DIAGNOSIS — Y939 Activity, unspecified: Secondary | ICD-10-CM | POA: Diagnosis not present

## 2015-12-11 DIAGNOSIS — F4321 Adjustment disorder with depressed mood: Secondary | ICD-10-CM | POA: Insufficient documentation

## 2015-12-11 DIAGNOSIS — E785 Hyperlipidemia, unspecified: Secondary | ICD-10-CM | POA: Diagnosis not present

## 2015-12-11 DIAGNOSIS — Z79899 Other long term (current) drug therapy: Secondary | ICD-10-CM | POA: Diagnosis not present

## 2015-12-11 DIAGNOSIS — Y929 Unspecified place or not applicable: Secondary | ICD-10-CM | POA: Diagnosis not present

## 2015-12-11 MED ORDER — TETANUS-DIPHTH-ACELL PERTUSSIS 5-2.5-18.5 LF-MCG/0.5 IM SUSP
0.5000 mL | Freq: Once | INTRAMUSCULAR | Status: AC
  Administered 2015-12-11: 0.5 mL via INTRAMUSCULAR
  Filled 2015-12-11: qty 0.5

## 2015-12-11 MED ORDER — LIDOCAINE HCL (PF) 1 % IJ SOLN
INTRAMUSCULAR | Status: AC
  Administered 2015-12-11: 11:00:00
  Filled 2015-12-11: qty 5

## 2015-12-11 NOTE — ED Notes (Signed)
About 2 am heard dog and tried to get up and tripped and hit nose on coffee table. Denies LOC. Bandage over bridge of nose with abrasion showing at edge.

## 2015-12-11 NOTE — Discharge Instructions (Signed)
Laceration Care, Adult °A laceration is a cut that goes through all of the layers of the skin and into the tissue that is right under the skin. Some lacerations heal on their own. Others need to be closed with stitches (sutures), staples, skin adhesive strips, or skin glue. Proper laceration care minimizes the risk of infection and helps the laceration to heal better. °HOW TO CARE FOR YOUR LACERATION °If sutures or staples were used: °· Keep the wound clean and dry. °· If you were given a bandage (dressing), you should change it at least one time per day or as told by your health care provider. You should also change it if it becomes wet or dirty. °· Keep the wound completely dry for the first 24 hours or as told by your health care provider. After that time, you may shower or bathe. However, make sure that the wound is not soaked in water until after the sutures or staples have been removed. °· Clean the wound one time each day or as told by your health care provider: °· Wash the wound with soap and water. °· Rinse the wound with water to remove all soap. °· Pat the wound dry with a clean towel. Do not rub the wound. °· After cleaning the wound, apply a thin layer of antibiotic ointment as told by your health care provider. This will help to prevent infection and keep the dressing from sticking to the wound. °· Have the sutures or staples removed as told by your health care provider. °If skin adhesive strips were used: °· Keep the wound clean and dry. °· If you were given a bandage (dressing), you should change it at least one time per day or as told by your health care provider. You should also change it if it becomes dirty or wet. °· Do not get the skin adhesive strips wet. You may shower or bathe, but be careful to keep the wound dry. °· If the wound gets wet, pat it dry with a clean towel. Do not rub the wound. °· Skin adhesive strips fall off on their own. You may trim the strips as the wound heals. Do not  remove skin adhesive strips that are still stuck to the wound. They will fall off in time. °If skin glue was used: °· Try to keep the wound dry, but you may briefly wet it in the shower or bath. Do not soak the wound in water, such as by swimming. °· After you have showered or bathed, gently pat the wound dry with a clean towel. Do not rub the wound. °· Do not do any activities that will make you sweat heavily until the skin glue has fallen off on its own. °· Do not apply liquid, cream, or ointment medicine to the wound while the skin glue is in place. Using those may loosen the film before the wound has healed. °· If you were given a bandage (dressing), you should change it at least one time per day or as told by your health care provider. You should also change it if it becomes dirty or wet. °· If a dressing is placed over the wound, be careful not to apply tape directly over the skin glue. Doing that may cause the glue to be pulled off before the wound has healed. °· Do not pick at the glue. The skin glue usually remains in place for 5-10 days, then it falls off of the skin. °General Instructions °· Take over-the-counter and prescription   medicines only as told by your health care provider. °· If you were prescribed an antibiotic medicine or ointment, take or apply it as told by your doctor. Do not stop using it even if your condition improves. °· To help prevent scarring, make sure to cover your wound with sunscreen whenever you are outside after stitches are removed, after adhesive strips are removed, or when glue remains in place and the wound is healed. Make sure to wear a sunscreen of at least 30 SPF. °· Do not scratch or pick at the wound. °· Keep all follow-up visits as told by your health care provider. This is important. °· Check your wound every day for signs of infection. Watch for: °· Redness, swelling, or pain. °· Fluid, blood, or pus. °· Raise (elevate) the injured area above the level of your heart  while you are sitting or lying down, if possible. °SEEK MEDICAL CARE IF: °· You received a tetanus shot and you have swelling, severe pain, redness, or bleeding at the injection site. °· You have a fever. °· A wound that was closed breaks open. °· You notice a bad smell coming from your wound or your dressing. °· You notice something coming out of the wound, such as wood or glass. °· Your pain is not controlled with medicine. °· You have increased redness, swelling, or pain at the site of your wound. °· You have fluid, blood, or pus coming from your wound. °· You notice a change in the color of your skin near your wound. °· You need to change the dressing frequently due to fluid, blood, or pus draining from the wound. °· You develop a new rash. °· You develop numbness around the wound. °SEEK IMMEDIATE MEDICAL CARE IF: °· You develop severe swelling around the wound. °· Your pain suddenly increases and is severe. °· You develop painful lumps near the wound or on skin that is anywhere on your body. °· You have a red streak going away from your wound. °· The wound is on your hand or foot and you cannot properly move a finger or toe. °· The wound is on your hand or foot and you notice that your fingers or toes look pale or bluish. °  °This information is not intended to replace advice given to you by your health care provider. Make sure you discuss any questions you have with your health care provider. °  °Document Released: 06/19/2005 Document Revised: 11/03/2014 Document Reviewed: 06/15/2014 °Elsevier Interactive Patient Education ©2016 [employer] ° °Stitches, Staples, or Adhesive Wound Closure °Health care providers use stitches (sutures), staples, and certain glue (skin adhesives) to hold skin together while it heals (wound closure). You may need this treatment after you have surgery or if you cut your skin accidentally. These methods help your skin to heal more quickly and make it less likely that you will have  a scar. A wound may take several months to heal completely. °The type of wound you have determines when your wound gets closed. In most cases, the wound is closed as soon as possible (primary skin closure). Sometimes, closure is delayed so the wound can be cleaned and allowed to heal naturally. This reduces the chance of infection. Delayed closure may be needed if your wound: °· Is caused by a bite. °· Happened more than 6 hours ago. °· Involves loss of skin or the tissues under the skin. °· Has dirt or debris in it that cannot be removed. °· Is infected. °WHAT   ARE THE DIFFERENT KINDS OF WOUND CLOSURES? °There are many options for wound closure. The one that your health care provider uses depends on how deep and how large your wound is. °Adhesive Glue °To use this type of glue to close a wound, your health care provider holds the edges of the wound together and paints the glue on the surface of your skin. You may need more than one layer of glue. Then the wound may be covered with a light bandage (dressing). °This type of skin closure may be used for small wounds that are not deep (superficial). Using glue for wound closure is less painful than other methods. It does not require a medicine that numbs the area (local anesthetic). This method also leaves nothing to be removed. Adhesive glue is often used for children and on facial wounds. °Adhesive glue cannot be used for wounds that are deep, uneven, or bleeding. It is not used inside of a wound.  °Adhesive Strips °These strips are made of sticky (adhesive), porous paper. They are applied across your skin edges like a regular adhesive bandage. You leave them on until they fall off. °Adhesive strips may be used to close very superficial wounds. They may also be used along with sutures to improve the closure of your skin edges.  °Sutures °Sutures are the oldest method of wound closure. Sutures can be made from natural substances, such as silk, or from synthetic  materials, such as nylon and steel. They can be made from a material that your body can break down as your wound heals (absorbable), or they can be made from a material that needs to be removed from your skin (nonabsorbable). They come in many different strengths and sizes. °Your health care provider attaches the sutures to a steel needle on one end. Sutures can be passed through your skin, or through the tissues beneath your skin. Then they are tied and cut. Your skin edges may be closed in one continuous stitch or in separate stitches. °Sutures are strong and can be used for all kinds of wounds. Absorbable sutures may be used to close tissues under the skin. The disadvantage of sutures is that they may cause skin reactions that lead to infection. Nonabsorbable sutures need to be removed. °Staples °When surgical staples are used to close a wound, the edges of your skin on both sides of the wound are brought close together. A staple is placed across the wound, and an instrument secures the edges together. Staples are often used to close surgical cuts (incisions). °Staples are faster to use than sutures, and they cause less skin reaction. Staples need to be removed using a tool that bends the staples away from your skin. °HOW DO I CARE FOR MY WOUND CLOSURE? °· Take medicines only as directed by your health care provider. °· If you were prescribed an antibiotic medicine for your wound, finish it all even if you start to feel better. °· Use ointments or creams only as directed by your health care provider. °· Wash your hands with soap and water before and after touching your wound. °· Do not soak your wound in water. Do not take baths, swim, or use a hot tub until your health care provider approves. °· Ask your health care provider when you can start showering. Cover your wound if directed by your health care provider. °· Do not take out your own sutures or staples. °· Do not pick at your wound. Picking can cause an  infection. °·   Keep all follow-up visits as directed by your health care provider. This is important. HOW LONG WILL I HAVE MY WOUND CLOSURE?  Leave adhesive glue on your skin until the glue peels away.  Leave adhesive strips on your skin until the strips fall off.  Absorbable sutures will dissolve within several days.  Nonabsorbable sutures and staples must be removed. The location of the wound will determine how long they stay in. This can range from several days to a couple of weeks. WHEN SHOULD I SEEK HELP FOR MY WOUND CLOSURE? Contact your health care provider if:  You have a fever.  You have chills.  You have drainage, redness, swelling, or pain at your wound.  There is a bad smell coming from your wound.  The skin edges of your wound start to separate after your sutures have been removed.  Your wound becomes thick, raised, and darker in color after your sutures come out (scarring).   This information is not intended to replace advice given to you by your health care provider. Make sure you discuss any questions you have with your health care provider.   Document Released: 03/14/2001 Document Revised: 07/10/2014 Document Reviewed: 11/26/2013 Elsevier Interactive Patient Education 2016 [employer]  Nasal Fracture A nasal fracture is a break or crack in the bones or cartilage of the nose. Minor breaks do not require treatment. These breaks usually heal on their own after about one month. Serious breaks may require surgery. CAUSES This injury is usually caused by a blunt injury to the nose. This type of injury often occurs from:  Contact sports.  Car accidents.  Falls.  Getting punched. SYMPTOMS Symptoms of this injury include:  Pain.  Swelling of the nose.  Bleeding from the nose.  Bruising around the nose or eyes. This may include having black eyes.  Crooked appearance of the nose. DIAGNOSIS This injury may be diagnosed with a physical exam. The health  care provider will gently feel the nose for signs of broken bones. He or she will look inside the nostrils to make sure that there is not a blood-filled swelling on the dividing wall between the nostrils (septal hematoma). X-rays of the nose may not show a nasal fracture even when one is present. In some cases, X-rays or a CT scan may be done 1-5 days after the injury. Sometimes, the health care provider will want to wait until the swelling has gone down. TREATMENT Often, minor fractures that have caused no deformity do not require treatment. More serious fractures in which bones have moved out of position may require surgery, which will take place after the swelling is gone. Surgery will stabilize and align the fracture. In some cases, a health care provider may be able to reposition the bones without surgery. This may be done in the health care provider's office after medicine is given to numb the area (local anesthetic). HOME CARE INSTRUCTIONS  If directed, apply ice to the injured area:  Put ice in a plastic bag.  Place a towel between your skin and the bag.  Leave the ice on for 20 minutes, 2-3 times per day.  Take over-the-counter and prescription medicines only as told by your health care provider.  If your nose starts to bleed, sit in an upright position while you squeeze the soft parts of your nose against the dividing wall between your nostrils (septum) for 10 minutes.  Try to avoid blowing your nose.  Return to your normal activities as told by  your health care provider. Ask your health care provider what activities are safe for you.  Avoid contact sports for 3-4 weeks or as told by your health care provider.  Keep all follow-up visits as told by your health care provider. This is important. SEEK MEDICAL CARE IF:  Your pain increases or becomes severe.  You continue to have nosebleeds.  The shape of your nose does not return to normal within 5 days.  You have pus draining  out of your nose. SEEK IMMEDIATE MEDICAL CARE IF:  You have bleeding from your nose that does not stop after you pinch your nostrils closed for 20 minutes and keep ice on your nose.  You have clear fluid draining out of your nose.  You notice a grape-like swelling on the septum. This swelling is a collection of blood (hematoma) that must be drained to help prevent infection.  You have difficulty moving your eyes.  You have repeated vomiting.   This information is not intended to replace advice given to you by your health care provider. Make sure you discuss any questions you have with your health care provider.   Document Released: 06/16/2000 Document Revised: 03/10/2015 Document Reviewed: 07/27/2014 Elsevier Interactive Patient Education [employer].

## 2015-12-11 NOTE — ED Provider Notes (Signed)
[facility] Emergency Department Provider Note  ____________________________________________  Time seen: Approximately 10:23 AM  I have reviewed the triage vital signs and the nursing notes.   HISTORY  Chief Complaint Facial Injury    HPI Lynn Mccoy is a 75 y.o. female patient who fell this morning about 2:00 which will be 8 hours ago. She tripped and hit her nose on a coffee table. Denies any loss of consciousness. Denies any headache. Denies any visual disturbances. Does have a laceration to the bridge of her nose. Pain is minimal and she is currently a 5/10. Has not taken any medication over-the-counter but has used a Band-Aid to cover up the bleeding.   Past Medical History  Diagnosis Date  . History of mumps as a child   . Depression   . Hyperlipidemia   . Osteoarthrosis   . Osteopenia   . History of cervical cancer 12/29/2014    stage 4, s/p complete hysterectomy   . Cancer [facility])     ovarian ca    Patient Active Problem List   Diagnosis Date Noted  . Allergic rhinitis 01/22/2015  . Depression 12/29/2014  . Fatigue 12/29/2014  . Arthritis, degenerative 11/06/2013  . History of repair of rotator cuff 11/06/2013  . Insomnia 12/06/2009  . Adjustment disorder with depressed mood 01/13/2009  . Osteopenia 11/24/2008  . Diverticulosis of colon without hemorrhage 10/29/2008  . Obesity 10/29/2008  . Osteoarthrosis 10/29/2008    Past Surgical History  Procedure Laterality Date  . Abdominal hysterectomy  1977    due to cervical cancer stage 4  . Appendectomy  1977    Current Outpatient Rx  Name  Route  Sig  Dispense  Refill  . aspirin 81 MG chewable tablet   Oral   Chew 1 tablet by mouth daily.         Marland Kitchen azelastine (OPTIVAR) 0.05 % ophthalmic solution   Both Eyes   Place 1 drop into both eyes 2 (two) times daily as needed.   6 mL   2   . Calcium Carbonate 1500 (600 CA) MG TABS   Oral   Take 1 tablet by mouth daily.        . cholecalciferol (VITAMIN D) 1000 UNITS tablet   Oral   Take 1 tablet by mouth daily.         . Cyanocobalamin (VITAMIN B 12) 100 MCG LOZG   Oral   Take 1 tablet by mouth daily.         . fluticasone (FLONASE) 50 MCG/ACT nasal spray   Each Nare   Place 2 sprays into both nostrils daily. For allergies   16 g   2   . loratadine (CLARITIN) 10 MG tablet   Oral   Take 1 tablet by mouth daily as needed.         . montelukast (SINGULAIR) 10 MG tablet      TAKE ONE TABLET BY MOUTH EVERY NIGHT AT BEDTIME   30 tablet   12   . sertraline (ZOLOFT) 50 MG tablet   Oral   Take 1 tablet (50 mg total) by mouth daily.   30 tablet   3   . traZODone (DESYREL) 100 MG tablet      TAKE ONE-HALF (1/2) TO ONE (1) TABLET BY MOUTH ONE HOUR BEFORE BEDTIME   30 tablet   12     Allergies Review of patient's allergies indicates no known allergies.  Family History  Problem Relation Age  of Onset  . Leukemia Mother   . Lung cancer Father   . Dementia Sister   . Alzheimer's disease Sister   . Heart attack Brother   . Rheum arthritis Sister   . COPD Sister     Social History Social History  Substance Use Topics  . Smoking status: Never Smoker   . Smokeless tobacco: None  . Alcohol Use: 0.6 oz/week    1 Standard drinks or equivalent per week     Comment: occasional use    Review of Systems Constitutional: No fever/chills Eyes: No visual changes. ENT: No sore throat. Cardiovascular: Denies chest pain. Respiratory: Denies shortness of breath. Musculoskeletal: Negative for back pain. Skin: Positive for 1 cm laceration to the bridge of the nose. Neurological: Negative for headaches, focal weakness or numbness.  10-point ROS otherwise negative.  ____________________________________________   PHYSICAL EXAM:  VITAL SIGNS: ED Triage Vitals  Enc Vitals Group     BP 12/11/15 0956 156/65 mmHg     Pulse Rate 12/11/15 0956 79     Resp 12/11/15 0956 18     Temp 12/11/15  0956 97.7 F (36.5 C)     Temp Source 12/11/15 0956 Oral     SpO2 12/11/15 0956 94 %     Weight 12/11/15 0956 200 lb (90.719 kg)     Height 12/11/15 0956 5\' 7"  (1.702 m)     Head Cir --      Peak Flow --      Pain Score 12/11/15 0957 5     Pain Loc --      Pain Edu? --      Excl. in [place]? --     Constitutional: Alert and oriented. Well appearing and in no acute distress. Eyes: Conjunctivae are normal. PERRL. EOMI. Head: Atraumatic. Nose: No congestion/rhinnorhea.Horizontal laceration approximately 1 cm to the bridge of the nose daily controlled. Mouth/Throat: Mucous membranes are moist.  Oropharynx non-erythematous. Neck: No stridor.   Cardiovascular: Normal rate, regular rhythm. Grossly normal heart sounds.  Good peripheral circulation. Respiratory: Normal respiratory effort.  No retractions. Lungs CTAB. Gastrointestinal: Soft and nontender. No distention. No abdominal bruits. No CVA tenderness. Musculoskeletal: No lower extremity tenderness nor edema.  No joint effusions. Neurologic:  Normal speech and language. No gross focal neurologic deficits are appreciated. No gait instability. Skin:  Skin is warm, dry and intact. No rash noted. Psychiatric: Mood and affect are normal. Speech and behavior are normal.  ____________________________________________   LABS (all labs ordered are listed, but only abnormal results are displayed)  Labs Reviewed - No data to display ____________________________________________  EKG   ____________________________________________  RADIOLOGY  FINDINGS: No fluid in the paranasal sinuses. Minimally displaced right nasal bone fracture.  IMPRESSION: Right nasal bone fracture ____________________________________________   PROCEDURES  Procedure(s) performed: Yes  LACERATION REPAIR Performed by: Arlyss Repress Authorized by: Arlyss Repress Consent: Verbal consent obtained. Risks and benefits: risks, benefits and alternatives were  discussed Consent given by: patient Patient identity confirmed: provided demographic data Prepped and Draped in normal sterile fashion Wound explored  Laceration Location: Superior aspect or bridge of the nose.  Laceration Length: 1 cm  No Foreign Bodies seen or palpated  Anesthesia: local infiltration  Local anesthetic: lidocaine 1 % without epinephrine  Anesthetic total: 3 ml  Irrigation method: syringe Amount of cleaning: standard  Skin closure: 4-0 Ethilon  Number of sutures: 4   Technique: Simple interrupted   Patient tolerance: Patient tolerated the procedure well with no immediate  complications.  Critical Care performed: No  ____________________________________________   INITIAL IMPRESSION / ASSESSMENT AND PLAN / ED COURSE  Pertinent labs & imaging results that were available during my care of the patient were reviewed by me and considered in my medical decision making (see chart for details).  Status post fall with nasal laceration and right nasal bone fracture.. Wound closed as noted above. Patient to follow up PCP suture removal in 7-10 days. Tetanus updated with this time. ____________________________________________   FINAL CLINICAL IMPRESSION(S) / ED DIAGNOSES  Final diagnoses:  Laceration of nose without complication, initial encounter  Nasal fracture, closed, initial encounter     This chart was dictated using voice recognition software/Dragon. Despite best efforts to proofread, errors can occur which can change the meaning. Any change was purely unintentional.   Arlyss Repress, PA-C 12/11/15 1124  Harvest Dark, MD 12/11/15 1453

## 2015-12-17 ENCOUNTER — Ambulatory Visit: Payer: Self-pay | Admitting: Family Medicine

## 2015-12-20 ENCOUNTER — Ambulatory Visit (INDEPENDENT_AMBULATORY_CARE_PROVIDER_SITE_OTHER): Payer: Medicare Other | Admitting: Family Medicine

## 2015-12-20 ENCOUNTER — Encounter: Payer: Self-pay | Admitting: Family Medicine

## 2015-12-20 VITALS — BP 140/80 | HR 77 | Temp 98.0°F | Resp 16 | Ht 65.5 in | Wt 209.0 lb

## 2015-12-20 DIAGNOSIS — S0121XA Laceration without foreign body of nose, initial encounter: Secondary | ICD-10-CM | POA: Diagnosis not present

## 2015-12-20 NOTE — Progress Notes (Signed)
       Patient: Lynn Mccoy Female    DOB: [birth year]   75 y.o.   MRN: [id number] Visit Date: 12/20/2015  Today's Provider: [name], MD   Chief Complaint  Patient presents with  . Suture / Staple Removal   Subjective:    HPI  Patient is in office to have stitches removed from her nose. Patient fell 12/11/2015,sustaining a laceration to the bridge of her nose and minimally displaced right nasal bone fracture.She tripped and hit her nose on a coffee table. Treatment at ER included 4 sutures.    No Known Allergies Current Meds  Medication Sig  . aspirin 81 MG chewable tablet Chew 1 tablet by mouth daily.  Marland Kitchen azelastine (OPTIVAR) 0.05 % ophthalmic solution Place 1 drop into both eyes 2 (two) times daily as needed.  . Calcium Carbonate 1500 (600 CA) MG TABS Take 1 tablet by mouth daily.  . cholecalciferol (VITAMIN D) 1000 UNITS tablet Take 1 tablet by mouth daily.  . Cyanocobalamin (VITAMIN B 12) 100 MCG LOZG Take 1 tablet by mouth daily.  . fluticasone (FLONASE) 50 MCG/ACT nasal spray Place 2 sprays into both nostrils daily. For allergies  . loratadine (CLARITIN) 10 MG tablet Take 1 tablet by mouth daily as needed.  . montelukast (SINGULAIR) 10 MG tablet TAKE ONE TABLET BY MOUTH EVERY NIGHT AT BEDTIME  . sertraline (ZOLOFT) 50 MG tablet Take 1 tablet (50 mg total) by mouth daily.  . traZODone (DESYREL) 100 MG tablet TAKE ONE-HALF (1/2) TO ONE (1) TABLET BY MOUTH ONE HOUR BEFORE BEDTIME    Review of Systems  Constitutional: Negative for fever, chills, appetite change and fatigue.  Respiratory: Negative for chest tightness and shortness of breath.   Cardiovascular: Negative for chest pain and palpitations.  Gastrointestinal: Negative for nausea, vomiting and abdominal pain.  Neurological: Negative for dizziness and weakness.    Social History  Substance Use Topics  . Smoking status: Never Smoker   . Smokeless tobacco: Not on file  . Alcohol Use: 0.6 oz/week    1  Standard drinks or equivalent per week     Comment: occasional use   Objective:   BP 140/80 mmHg  Pulse 77  Temp(Src) 98 F (36.7 C) (Oral)  Resp 16  Ht 5' 5.5" (1.664 m)  Wt 209 lb (94.802 kg)  BMI 34.24 kg/m2  SpO2 95%  Physical Exam  Skin. 4 sutures in place encrusted in scab over bridge of nose. No drainage. No erythema.     Assessment & Plan:     1. Laceration of nose, initial encounter 9 days s/p suture placement in ER. Removed all four sutures without complications. Advised to keep wound clean, dry and avoid rubbing or scratching.        [name], MD  [facility]

## 2016-02-12 ENCOUNTER — Emergency Department
Admission: EM | Admit: 2016-02-12 | Discharge: 2016-02-12 | Disposition: A | Discharge status: Home / Self Care | Payer: Medicare Other | Attending: Emergency Medicine | Admitting: Emergency Medicine

## 2016-02-12 ENCOUNTER — Encounter: Payer: Self-pay | Admitting: Emergency Medicine

## 2016-02-12 DIAGNOSIS — S81811A Laceration without foreign body, right lower leg, initial encounter: Secondary | ICD-10-CM | POA: Diagnosis not present

## 2016-02-12 DIAGNOSIS — Z8543 Personal history of malignant neoplasm of ovary: Secondary | ICD-10-CM | POA: Insufficient documentation

## 2016-02-12 DIAGNOSIS — W268XXA Contact with other sharp object(s), not elsewhere classified, initial encounter: Secondary | ICD-10-CM | POA: Insufficient documentation

## 2016-02-12 DIAGNOSIS — Y999 Unspecified external cause status: Secondary | ICD-10-CM | POA: Insufficient documentation

## 2016-02-12 DIAGNOSIS — Z8541 Personal history of malignant neoplasm of cervix uteri: Secondary | ICD-10-CM | POA: Insufficient documentation

## 2016-02-12 DIAGNOSIS — IMO0002 Reserved for concepts with insufficient information to code with codable children: Secondary | ICD-10-CM

## 2016-02-12 DIAGNOSIS — S8991XA Unspecified injury of right lower leg, initial encounter: Secondary | ICD-10-CM | POA: Diagnosis present

## 2016-02-12 DIAGNOSIS — Y929 Unspecified place or not applicable: Secondary | ICD-10-CM | POA: Insufficient documentation

## 2016-02-12 DIAGNOSIS — Y9389 Activity, other specified: Secondary | ICD-10-CM | POA: Diagnosis not present

## 2016-02-12 MED ORDER — SILVER NITRATE-POT NITRATE 75-25 % EX MISC
CUTANEOUS | Status: AC
  Filled 2016-02-12: qty 1

## 2016-02-12 NOTE — ED Provider Notes (Signed)
[facility] Emergency Department Provider Note  Time seen: 10:16 AM  I have reviewed the triage vital signs and the nursing notes.   HISTORY  Chief Complaint Laceration    HPI Lynn Mccoy is a 75 y.o. female With a past , who presents the emergency department for a right leg bleeding. According to the patient she was shaving her legs, she looked down and noticed profuse bleeding from her right leg. States she did not realize that she had cut herself.States she attempted to hold pressure but the leg continued bleeding so she came to the emergency department.patient denies any blood thinner use. Denies any near syncope or lightheadedness.denies any other bleeding.  Past Medical History:  Diagnosis Date  . Cancer ([place])    ovarian ca  . Depression   . History of cervical cancer 12/29/2014   stage 4, s/p complete hysterectomy   . History of mumps as a child   . Hyperlipidemia   . Osteoarthrosis   . Osteopenia     Patient Active Problem List   Diagnosis Date Noted  . Allergic rhinitis 01/22/2015  . Depression 12/29/2014  . Fatigue 12/29/2014  . Arthritis, degenerative 11/06/2013  . History of repair of rotator cuff 11/06/2013  . Insomnia 12/06/2009  . Adjustment disorder with depressed mood 01/13/2009  . Osteopenia 11/24/2008  . Diverticulosis of colon without hemorrhage 10/29/2008  . Obesity 10/29/2008  . Osteoarthrosis 10/29/2008    Past Surgical History:  Procedure Laterality Date  . ABDOMINAL HYSTERECTOMY  1977   due to cervical cancer stage 4  . APPENDECTOMY  1977    Prior to Admission medications   Medication Sig Start Date End Date Taking? Authorizing Provider  aspirin 81 MG chewable tablet Chew 1 tablet by mouth daily.    Historical Provider, MD  azelastine (OPTIVAR) 0.05 % ophthalmic solution Place 1 drop into both eyes 2 (two) times daily as needed. 11/01/15   Birdie Sons, MD  Calcium Carbonate 1500 (600 CA) MG TABS Take 1  tablet by mouth daily.    Historical Provider, MD  cholecalciferol (VITAMIN D) 1000 UNITS tablet Take 1 tablet by mouth daily.    Historical Provider, MD  Cyanocobalamin (VITAMIN B 12) 100 MCG LOZG Take 1 tablet by mouth daily.    Historical Provider, MD  fluticasone (FLONASE) 50 MCG/ACT nasal spray Place 2 sprays into both nostrils daily. For allergies 11/01/15   Birdie Sons, MD  loratadine (CLARITIN) 10 MG tablet Take 1 tablet by mouth daily as needed. 10/29/09   Historical Provider, MD  montelukast (SINGULAIR) 10 MG tablet TAKE ONE TABLET BY MOUTH EVERY NIGHT AT BEDTIME 03/26/15   Birdie Sons, MD  sertraline (ZOLOFT) 50 MG tablet Take 1 tablet (50 mg total) by mouth daily. 10/21/15   Birdie Sons, MD  traZODone (DESYREL) 100 MG tablet TAKE ONE-HALF (1/2) TO ONE (1) TABLET BY MOUTH ONE HOUR BEFORE BEDTIME 08/10/15   Birdie Sons, MD    No Known Allergies  Family History  Problem Relation Age of Onset  . Leukemia Mother   . Lung cancer Father   . Dementia Sister   . Alzheimer's disease Sister   . Heart attack Brother   . Rheum arthritis Sister   . COPD Sister     Social History Social History  Substance Use Topics  . Smoking status: Never Smoker  . Smokeless tobacco: Not on file  . Alcohol use 0.6 oz/week    1 Standard drinks  or equivalent per week     Comment: occasional use    Review of Systems Constitutional: Negative for fever. Cardiovascular: Negative for chest pain. Respiratory: Negative for shortness of breath. Gastrointestinal: Negative for abdominal pain Skin: laceration to right leg. 10-point ROS otherwise negative.  ____________________________________________   PHYSICAL EXAM:  VITAL SIGNS: ED Triage Vitals  Enc Vitals Group     BP 02/12/16 1005 (!) 150/68     Pulse Rate 02/12/16 1005 76     Resp 02/12/16 1005 17     Temp 02/12/16 1005 98.3 F (36.8 C)     Temp Source 02/12/16 1005 Oral     SpO2 02/12/16 1005 96 %     Weight 02/12/16 1003 200  lb (90.7 kg)     Height 02/12/16 1003 5\' 7"  (1.702 m)     Head Circumference --      Peak Flow --      Pain Score --      Pain Loc --      Pain Edu? --      Excl. in [place]? --     Constitutional: Alert and oriented. Well appearing and in no distress. Eyes: Normal exam ENT   Head: Normocephalic and atraumatic.   Mouth/Throat: Mucous membranes are moist. Cardiovascular: Normal rate, regular rhythm. No murmur Respiratory: Normal respiratory effort without tachypnea nor retractions. Breath sounds are clear Gastrointestinal: Soft and nontender. No distention.   Musculoskeletal: Nontender with normal range of motion in all extremities. Patient has a very small less than half centimeter laceration to the right lower extremity however it does appear to be bleeding briskly. Likely cut varicose Vein. Neurologic:  Normal speech and language. No gross focal neurologic deficits Psychiatric: Mood and affect are normal. Speech and behavior are normal.   ____________________________________________   INITIAL IMPRESSION / ASSESSMENT AND PLAN / ED COURSE  Pertinent labs & imaging results that were available during my care of the patient were reviewed by me and considered in my medical decision making (see chart for details).  Patient with very small laceration to distal right lower extremity however has a brisk bleed, likely cut a varicose vein. We will attempt cautery with silver nitrate and Dermabond.  patient denies any other bleeding. Overall appears very well.  Leg remains hemostatic after silver nitrate cauterization and Dermabond applied. I have covered with a 2 x 2 dressing and a Tegaderm. Instructed the patient to leave the dressing in place for 2 days and avoid getting it wet. Patient is agreeable. Will return for any significant bleeding.  ____________________________________________   FINAL CLINICAL IMPRESSION(S) / ED DIAGNOSES  laceration    Harvest Dark, MD 02/12/16  1049

## 2016-02-12 NOTE — ED Notes (Signed)
Arterial Bleed noted to right medial ankle. MD at bedside

## 2016-02-12 NOTE — ED Triage Notes (Signed)
Pt cut right leg shaving. Possibly hit artery, blood shooting out when pressure released

## 2016-03-31 ENCOUNTER — Other Ambulatory Visit: Payer: Self-pay | Admitting: Family Medicine

## 2016-04-03 ENCOUNTER — Ambulatory Visit (INDEPENDENT_AMBULATORY_CARE_PROVIDER_SITE_OTHER): Payer: Medicare Other | Admitting: Family Medicine

## 2016-04-03 ENCOUNTER — Encounter: Payer: Self-pay | Admitting: Family Medicine

## 2016-04-03 VITALS — BP 124/70 | HR 68 | Temp 97.6°F | Resp 16 | Ht 65.0 in | Wt 213.0 lb

## 2016-04-03 DIAGNOSIS — G4700 Insomnia, unspecified: Secondary | ICD-10-CM | POA: Diagnosis not present

## 2016-04-03 DIAGNOSIS — Z0000 Encounter for general adult medical examination without abnormal findings: Secondary | ICD-10-CM | POA: Diagnosis not present

## 2016-04-03 DIAGNOSIS — F4321 Adjustment disorder with depressed mood: Secondary | ICD-10-CM

## 2016-04-03 MED ORDER — SERTRALINE HCL 100 MG PO TABS: 50.0000 mg | ORAL_TABLET | Freq: Every day | ORAL | 3 refills | Status: DC

## 2016-04-03 MED ORDER — SERTRALINE HCL 100 MG PO TABS: 100.0000 mg | ORAL_TABLET | Freq: Every day | ORAL | 3 refills | Status: DC

## 2016-04-03 NOTE — Patient Instructions (Signed)
Take 1 1/2 Zoloft 50 mg for 2-3 days before starting the Zoloft 100 mg.

## 2016-04-03 NOTE — Progress Notes (Signed)
Patient: Lynn Mccoy, Female    DOB: Mar 27, 1941, 75 y.o.   MRN: [id number] Visit Date: 04/03/2016  Today's Provider: [name], MD   Chief Complaint  Patient presents with  . Annual Exam  . Depression   Subjective:    Annual physical Lynn Mccoy is a 75 y.o. female. She feels well. She reports exercising about 4 days a week, uses bicycle or "wonderbar". She reports she is sleeping fairly well.  ----------------------------------------------------------- Last Colonoscopy- 04/06/2014- Diverticulosis; internal hemorrhoids. Last BMD- 04/28/2014- osteopenia Last mammogram- 04/16/2015- BI-RADS 1 Vaccines except Flu UTD, and refuses Flu vaccine today.   Depression, Follow-up  She  was last seen for this 03/24/2015. Changes made at last visit include none; was doing well on Sertraline.   She reports fair compliance with treatment. Is not taking daily. She is not having side effects.  She reports good tolerance of treatment. Current symptoms include: depressed mood, fatigue and insomnia She feels she is Unchanged since last visit. States Zoloft does not seem to be as effective as it was.  Depression screen PHQ 2/9 04/03/2016  Decreased Interest 1  Down, Depressed, Hopeless 2  PHQ - 2 Score 3  Altered sleeping 1  Tired, decreased energy 1  Change in appetite 0  Feeling bad or failure about yourself  1  Trouble concentrating 0  Moving slowly or fidgety/restless 1  Suicidal thoughts 0  PHQ-9 Score 7  Difficult doing work/chores Not difficult at all    ------------------------------------------------------------------------     Review of Systems  Constitutional: Positive for fatigue. Negative for activity change, appetite change, chills, diaphoresis, fever and unexpected weight change.  HENT: Positive for drooling and sneezing. Negative for congestion, dental problem, ear discharge, ear pain, facial swelling, hearing loss, mouth sores,  nosebleeds, postnasal drip, rhinorrhea, sinus pressure, sore throat, tinnitus, trouble swallowing and voice change.   Eyes: Positive for photophobia and itching. Negative for pain, discharge, redness and visual disturbance.  Respiratory: Positive for shortness of breath. Negative for apnea, cough, choking, chest tightness, wheezing and stridor.   Cardiovascular: Negative.   Gastrointestinal: Negative.   Endocrine: Negative.   Genitourinary: Negative.   Musculoskeletal: Negative.   Skin: Negative.   Allergic/Immunologic: Negative.   Neurological: Positive for dizziness and numbness (hands). Negative for tremors, seizures, syncope, facial asymmetry, speech difficulty, weakness, light-headedness and headaches.  Hematological: Negative.   Psychiatric/Behavioral: Positive for dysphoric mood and sleep disturbance. Negative for agitation, behavioral problems, confusion, decreased concentration, hallucinations, self-injury and suicidal ideas. The patient is not nervous/anxious and is not hyperactive.     Social History   Social History  . Marital status: Widowed    Spouse name: N/A  . Number of children: 3  . Years of education: 12   Occupational History  . Retired     former Haematologist   Social History Main Topics  . Smoking status: Never Smoker  . Smokeless tobacco: Never Used  . Alcohol use 0.6 oz/week    1 Standard drinks or equivalent per week     Comment: occasional use  . Drug use: No  . Sexual activity: Not Currently   Other Topics Concern  . Not on file   Social History Narrative  . No narrative on file    Past Medical History:  Diagnosis Date  . Cancer ([place])    ovarian ca  . Depression   . History of cervical cancer 12/29/2014   stage 4, s/p complete hysterectomy   . History  of mumps as a child   . Hyperlipidemia   . Osteoarthrosis   . Osteopenia      Patient Active Problem List   Diagnosis Date Noted  . Allergic rhinitis 01/22/2015  . Depression  12/29/2014  . Fatigue 12/29/2014  . Arthritis, degenerative 11/06/2013  . History of repair of rotator cuff 11/06/2013  . Insomnia 12/06/2009  . Adjustment disorder with depressed mood 01/13/2009  . Osteopenia 11/24/2008  . Diverticulosis of colon without hemorrhage 10/29/2008  . Obesity 10/29/2008  . Osteoarthrosis 10/29/2008    Past Surgical History:  Procedure Laterality Date  . ABDOMINAL HYSTERECTOMY  1977   due to cervical cancer stage 4  . APPENDECTOMY  1977    Her family history includes Alzheimer's disease in her sister; COPD in her sister; Dementia in her sister; Heart attack in her brother; Leukemia in her mother; Lung cancer in her father; Rheum arthritis in her sister.    Current Meds  Medication Sig  . aspirin 81 MG chewable tablet Chew 1 tablet by mouth daily.  . cholecalciferol (VITAMIN D) 1000 UNITS tablet Take 1 tablet by mouth daily.  . Cyanocobalamin (VITAMIN B 12) 100 MCG LOZG Take 1 tablet by mouth daily.  Marland Kitchen loratadine (CLARITIN) 10 MG tablet Take 1 tablet by mouth daily as needed.  . Misc Natural Products (OSTEO BI-FLEX ADV DOUBLE ST) CAPS Take by mouth.  . montelukast (SINGULAIR) 10 MG tablet TAKE ONE TABLET BY MOUTH EVERY NIGHT AT BEDTIME  . naproxen sodium (ANAPROX) 220 MG tablet Take 220 mg by mouth 2 (two) times daily with a meal.  . sertraline (ZOLOFT) 50 MG tablet Take 1 tablet (50 mg total) by mouth daily.  . traZODone (DESYREL) 100 MG tablet TAKE ONE-HALF (1/2) TO ONE (1) TABLET BY MOUTH ONE HOUR BEFORE BEDTIME    Patient Care Team: Birdie Sons, MD as PCP - General (Family Medicine) Leanor Kail, MD (Unknown Physician Specialty)    Objective:   Vitals: BP 124/70 (BP Location: Right Arm, Patient Position: Sitting, Cuff Size: Large)   Pulse 68   Temp 97.6 F (36.4 C) (Oral)   Resp 16   Ht 5\' 5"  (1.651 m)   Wt 213 lb (96.6 kg)   BMI 35.45 kg/m   Physical Exam  Constitutional: She is oriented to person, place, and time. She appears  well-developed and well-nourished.  HENT:  Head: Normocephalic and atraumatic.  Right Ear: Tympanic membrane, external ear and ear canal normal.  Left Ear: Tympanic membrane, external ear and ear canal normal.  Nose: Nose normal.  Mouth/Throat: Uvula is midline, oropharynx is clear and moist and mucous membranes are normal.  Eyes: Conjunctivae, EOM and lids are normal. Pupils are equal, round, and reactive to light.  Neck: Trachea normal and normal range of motion. Neck supple. Carotid bruit is not present. No thyroid mass and no thyromegaly present.  Cardiovascular: Normal rate, regular rhythm and normal heart sounds.   No carotid bruit  Pulmonary/Chest: Effort normal and breath sounds normal.  Abdominal: Soft. Normal appearance and bowel sounds are normal. There is no hepatosplenomegaly. There is no tenderness.  Genitourinary: No breast swelling, tenderness or discharge.  Musculoskeletal: Normal range of motion.  Lymphadenopathy:    She has no cervical adenopathy.    She has no axillary adenopathy.  Neurological: She is alert and oriented to person, place, and time. She has normal strength. No cranial nerve deficit.  Skin: Skin is warm, dry and intact.  Psychiatric: She has a normal  mood and affect. Her speech is normal and behavior is normal. Judgment and thought content normal. Cognition and memory are normal.    Activities of Daily Living In your present state of health, do you have any difficulty performing the following activities: 04/03/2016  Hearing? N  Vision? Y  Difficulty concentrating or making decisions? N  Walking or climbing stairs? Y  Dressing or bathing? N  Doing errands, shopping? N  Some recent data might be hidden    Fall Risk Assessment Fall Risk  04/03/2016 03/24/2015  Falls in the past year? No No     Depression Screen PHQ 2/9 Scores 04/03/2016 03/24/2015  PHQ - 2 Score 3 0  PHQ- 9 Score 7 3    Cognitive Testing - 6-CIT  Correct? Score   What year is  it? yes 0 0 or 4  What month is it? yes 0 0 or 3  Memorize:    Pia Mau,  41,  [address],  [place],      What time is it? (within 1 hour) yes 0 0 or 3  Count backwards from 20 yes 0 0, 2, or 4  Name the months of the year yes 0 0, 2, or 4  Repeat name & address above no 2 0, 2, 4, 6, 8, or 10       TOTAL SCORE  2/28   Interpretation:  Normal  Normal (0-7) Abnormal (8-28)       Assessment & Plan:     Annual physical  Reviewed patient's Family Medical History Reviewed and updated list of patient's medical providers Assessment of cognitive impairment was done Assessed patient's functional ability Established a written schedule for health screening [place] Completed and Reviewed  Exercise Activities and Dietary recommendations Goals    None      Immunization History  Administered Date(s) Administered  . Pneumococcal Conjugate-13 03/12/2014  . Pneumococcal Polysaccharide-23 03/22/2005  . Td 01/19/1997, 11/23/2008  . Tdap 12/11/2015  . Zoster 10/28/2007    Health Maintenance  Topic Date Due  . INFLUENZA VACCINE  02/01/2016  . TETANUS/TDAP  12/10/2025  . DEXA SCAN  Completed  . ZOSTAVAX  Completed  . PNA vac Low Risk Adult  Completed      Discussed health benefits of physical activity, and encouraged her to engage in regular exercise appropriate for her age and condition.    1. Annual physical exam  - Comprehensive metabolic panel  2. Adjustment disorder with depressed mood  - sertraline (ZOLOFT) 100 MG tablet; Take 1 tablet (100 mg total) by mouth daily.  Dispense: 30 tablet; Refill: 3  3. Insomnia, unspecified type    The entirety of the information documented in the History of Present Illness, Review of Systems and Physical Exam were personally obtained by me. Portions of this information were initially documented by Renaldo Fiddler, CMA and reviewed by me for thoroughness and accuracy.    [name], MD  [facility]

## 2016-04-04 ENCOUNTER — Telehealth: Payer: Self-pay

## 2016-04-04 LAB — COMPREHENSIVE METABOLIC PANEL
ALK PHOS: 80 IU/L (ref 39–117)
ALT: 18 IU/L (ref 0–32)
AST: 22 IU/L (ref 0–40)
Albumin/Globulin Ratio: 1.8 (ref 1.2–2.2)
Albumin: 4.2 g/dL (ref 3.5–4.8)
BILIRUBIN TOTAL: 0.2 mg/dL (ref 0.0–1.2)
BUN/Creatinine Ratio: 22 (ref 12–28)
BUN: 16 mg/dL (ref 8–27)
CALCIUM: 9.4 mg/dL (ref 8.7–10.3)
CHLORIDE: 106 mmol/L (ref 96–106)
CO2: 25 mmol/L (ref 18–29)
Creatinine, Ser: 0.74 mg/dL (ref 0.57–1.00)
GFR calc Af Amer: 91 mL/min/{1.73_m2} (ref >59)
GFR, EST NON AFRICAN AMERICAN: 79 mL/min/{1.73_m2} (ref >59)
Globulin, Total: 2.4 g/dL (ref 1.5–4.5)
Glucose: 92 mg/dL (ref 65–99)
POTASSIUM: 4.5 mmol/L (ref 3.5–5.2)
Sodium: 146 mmol/L — ABNORMAL HIGH (ref 134–144)
Total Protein: 6.6 g/dL (ref 6.0–8.5)

## 2016-04-04 NOTE — Telephone Encounter (Signed)
Pt advised.   Thanks,   -Laura  

## 2016-04-04 NOTE — Telephone Encounter (Signed)
-----   Message from Birdie Sons, MD sent at 04/04/2016  8:01 AM EDT ----- Labs normal Continue current medications.  Follow up yearly

## 2016-04-27 ENCOUNTER — Other Ambulatory Visit: Payer: Self-pay | Admitting: Family Medicine

## 2016-04-27 DIAGNOSIS — Z1231 Encounter for screening mammogram for malignant neoplasm of breast: Secondary | ICD-10-CM

## 2016-05-08 ENCOUNTER — Ambulatory Visit
Admission: RE | Admit: 2016-05-08 | Discharge: 2016-05-08 | Disposition: A | Discharge status: Home / Self Care | Payer: Medicare Other | Source: Ambulatory Visit | Attending: Family Medicine | Admitting: Family Medicine

## 2016-05-08 DIAGNOSIS — Z1231 Encounter for screening mammogram for malignant neoplasm of breast: Secondary | ICD-10-CM | POA: Diagnosis not present

## 2016-05-31 DIAGNOSIS — M25561 Pain in right knee: Secondary | ICD-10-CM | POA: Diagnosis not present

## 2016-05-31 DIAGNOSIS — M1711 Unilateral primary osteoarthritis, right knee: Secondary | ICD-10-CM | POA: Diagnosis not present

## 2016-05-31 DIAGNOSIS — G8929 Other chronic pain: Secondary | ICD-10-CM | POA: Diagnosis not present

## 2016-07-04 DIAGNOSIS — M1711 Unilateral primary osteoarthritis, right knee: Secondary | ICD-10-CM | POA: Diagnosis not present

## 2016-07-05 ENCOUNTER — Other Ambulatory Visit: Payer: Self-pay | Admitting: Unknown Physician Specialty

## 2016-07-05 DIAGNOSIS — M1711 Unilateral primary osteoarthritis, right knee: Secondary | ICD-10-CM

## 2016-07-07 ENCOUNTER — Ambulatory Visit: Payer: Medicare Other | Admitting: Family Medicine

## 2016-07-18 ENCOUNTER — Ambulatory Visit
Admission: RE | Admit: 2016-07-18 | Discharge: 2016-07-18 | Disposition: A | Discharge status: Home / Self Care | Payer: Medicare Other | Source: Ambulatory Visit | Attending: Unknown Physician Specialty | Admitting: Unknown Physician Specialty

## 2016-07-18 DIAGNOSIS — M1711 Unilateral primary osteoarthritis, right knee: Secondary | ICD-10-CM | POA: Insufficient documentation

## 2016-07-18 DIAGNOSIS — M25561 Pain in right knee: Secondary | ICD-10-CM | POA: Diagnosis not present

## 2016-07-18 DIAGNOSIS — M7121 Synovial cyst of popliteal space [Baker], right knee: Secondary | ICD-10-CM | POA: Insufficient documentation

## 2016-08-01 ENCOUNTER — Ambulatory Visit (INDEPENDENT_AMBULATORY_CARE_PROVIDER_SITE_OTHER): Payer: Medicare Other | Admitting: Family Medicine

## 2016-08-01 ENCOUNTER — Encounter: Payer: Self-pay | Admitting: Family Medicine

## 2016-08-01 ENCOUNTER — Telehealth: Payer: Self-pay | Admitting: *Deleted

## 2016-08-01 VITALS — BP 136/72 | HR 72 | Temp 98.3°F | Resp 16 | Ht 67.0 in | Wt 208.0 lb

## 2016-08-01 DIAGNOSIS — M1990 Unspecified osteoarthritis, unspecified site: Secondary | ICD-10-CM

## 2016-08-01 DIAGNOSIS — Z9889 Other specified postprocedural states: Secondary | ICD-10-CM

## 2016-08-01 DIAGNOSIS — Z01818 Encounter for other preprocedural examination: Secondary | ICD-10-CM | POA: Diagnosis not present

## 2016-08-01 NOTE — Progress Notes (Signed)
Patient: Lynn Mccoy Female    DOB: Jul 29, 1940   76 y.o.   MRN: [id number] Visit Date: 08/01/2016  Today's Provider: [name], MD   Chief Complaint  Patient presents with  . Pre-op Exam   Subjective:    HPI  Patient presents today for a surgical clearance. She reports that she is having a total knee replacement on her right knee on 08/10/2016 by Dr. Jefm Bryant. Patient denies any adverse reactions to anestheia. She currently takes an aspirin 81mg  daily. Her last surgery was right rotator cuff repair by Dr. Sabra Heck about 10 years ago. She states surgery went well with no complications or anesthesia difficulties. She has no dyspnea, chest pain, dizziness, or palpitations. She has not known history of cardiac or pulmonary disease.   No Known Allergies   Current Outpatient Prescriptions:  .  aspirin 81 MG chewable tablet, Chew 1 tablet by mouth daily., Disp: , Rfl:  .  cholecalciferol (VITAMIN D) 1000 UNITS tablet, Take 1 tablet by mouth daily., Disp: , Rfl:  .  Cyanocobalamin (VITAMIN B 12) 100 MCG LOZG, Take 1 tablet by mouth daily., Disp: , Rfl:  .  loratadine (CLARITIN) 10 MG tablet, Take 1 tablet by mouth daily as needed., Disp: , Rfl:  .  Misc Natural Products (OSTEO BI-FLEX ADV DOUBLE ST) CAPS, Take by mouth., Disp: , Rfl:  .  montelukast (SINGULAIR) 10 MG tablet, TAKE ONE TABLET BY MOUTH EVERY NIGHT AT BEDTIME, Disp: 30 tablet, Rfl: 11 .  naproxen sodium (ANAPROX) 220 MG tablet, Take 220 mg by mouth 2 (two) times daily with a meal., Disp: , Rfl:  .  sertraline (ZOLOFT) 100 MG tablet, Take 1 tablet (100 mg total) by mouth daily., Disp: 30 tablet, Rfl: 3 .  traZODone (DESYREL) 100 MG tablet, TAKE ONE-HALF (1/2) TO ONE (1) TABLET BY MOUTH ONE HOUR BEFORE BEDTIME, Disp: 30 tablet, Rfl: 12  Review of Systems  Constitutional: Negative.   Respiratory: Negative.   Cardiovascular: Negative.   Musculoskeletal: Positive for arthralgias.  Neurological: Negative.      Social History  Substance Use Topics  . Smoking status: Never Smoker  . Smokeless tobacco: Never Used  . Alcohol use 0.6 oz/week    1 Standard drinks or equivalent per week     Comment: occasional use   Objective:   BP 136/72 (BP Location: Right Arm, Patient Position: Sitting, Cuff Size: Normal)   Pulse 72   Temp 98.3 F (36.8 C)   Resp 16   Ht 5\' 7"  (1.702 m)   Wt 208 lb (94.3 kg)   BMI 32.58 kg/m   Physical Exam   General Appearance:    Alert, cooperative, no distress, appears stated age  Head:    Normocephalic, without obvious abnormality, atraumatic  Eyes:    PERRL, conjunctiva/corneas clear, EOM's intact, fundi    benign, both eyes  Ears:    Normal TM's and external ear canals, both ears  Nose:   Nares normal, septum midline, mucosa normal, no drainage    or sinus tenderness  Throat:   Lips, mucosa, and tongue normal; teeth and gums normal  Neck:   Supple, symmetrical, trachea midline, no adenopathy;    thyroid:  no enlargement/tenderness/nodules; no carotid   bruit or JVD  Back:     Symmetric, no curvature, ROM normal, no CVA tenderness  Lungs:     Clear to auscultation bilaterally, respirations unlabored  Chest Wall:    No tenderness or deformity  Heart:    Regular rate and rhythm, S1 and S2 normal, no murmur, rub   or gallop  Abdomen:     Soft, non-tender, bowel sounds active all four quadrants,    no masses, no organomegaly  Extremities:   Extremities normal, atraumatic, no cyanosis or edema  Pulses:   2+ and symmetric all extremities  Skin:   Skin color, texture, turgor normal, no rashes or lesions  Lymph nodes:   Cervical, supraclavicular, and axillary nodes normal  Neurologic:   CNII-XII intact, normal strength, sensation and reflexes    throughout   EKG: Normal Sinus Rhythm    Assessment & Plan:     1. Pre-op examination  - EKG 12-Lead  2. Osteoarthritis, unspecified osteoarthritis type, unspecified site   3. History of repair of rotator  cuff  She had normal CMP in October. No pre-op labs ordered today. Advised patient that we could order an additional labs if her surgeon feels them to be necessary.   Patient has no absolute or relative contraindications to planned surgery and anesthesia including general anesthesia. He is at low risk for cardiac and pulmonary complications. Anticipates pre-op labs being done at upcoming appointment. If labs normal then no additional medical precautions recommended.     The entirety of the information documented in the History of Present Illness, Review of Systems and Physical Exam were personally obtained by me. Portions of this information were initially documented by Wilburt Finlay, CMA and reviewed by me for thoroughness and accuracy.    [name], MD  [facility]

## 2016-08-01 NOTE — Telephone Encounter (Signed)
Pt returned call. Pt is scheduled for surgical clearance appt today @ 4 pm. Thanks TNP

## 2016-08-01 NOTE — Telephone Encounter (Signed)
Patient needs to schedule a pre-op office visit. LMOVM for pt to return call, to schedule appt.

## 2016-08-03 ENCOUNTER — Other Ambulatory Visit (INDEPENDENT_AMBULATORY_CARE_PROVIDER_SITE_OTHER): Payer: Medicare Other

## 2016-08-03 ENCOUNTER — Other Ambulatory Visit (INDEPENDENT_AMBULATORY_CARE_PROVIDER_SITE_OTHER): Payer: Self-pay | Admitting: Unknown Physician Specialty

## 2016-08-03 DIAGNOSIS — R0989 Other specified symptoms and signs involving the circulatory and respiratory systems: Secondary | ICD-10-CM

## 2016-08-10 ENCOUNTER — Telehealth: Payer: Self-pay | Admitting: Family Medicine

## 2016-08-10 DIAGNOSIS — G4700 Insomnia, unspecified: Secondary | ICD-10-CM | POA: Diagnosis not present

## 2016-08-10 DIAGNOSIS — G8918 Other acute postprocedural pain: Secondary | ICD-10-CM | POA: Diagnosis not present

## 2016-08-10 DIAGNOSIS — Z4789 Encounter for other orthopedic aftercare: Secondary | ICD-10-CM | POA: Diagnosis not present

## 2016-08-10 DIAGNOSIS — Z96651 Presence of right artificial knee joint: Secondary | ICD-10-CM | POA: Diagnosis not present

## 2016-08-10 DIAGNOSIS — J309 Allergic rhinitis, unspecified: Secondary | ICD-10-CM | POA: Diagnosis not present

## 2016-08-10 DIAGNOSIS — G4730 Sleep apnea, unspecified: Secondary | ICD-10-CM | POA: Diagnosis not present

## 2016-08-10 DIAGNOSIS — M25561 Pain in right knee: Secondary | ICD-10-CM | POA: Diagnosis not present

## 2016-08-10 DIAGNOSIS — Z7952 Long term (current) use of systemic steroids: Secondary | ICD-10-CM | POA: Diagnosis not present

## 2016-08-10 DIAGNOSIS — I10 Essential (primary) hypertension: Secondary | ICD-10-CM | POA: Diagnosis not present

## 2016-08-10 DIAGNOSIS — M1711 Unilateral primary osteoarthritis, right knee: Secondary | ICD-10-CM | POA: Diagnosis not present

## 2016-08-10 DIAGNOSIS — Z7982 Long term (current) use of aspirin: Secondary | ICD-10-CM | POA: Diagnosis not present

## 2016-08-10 NOTE — Telephone Encounter (Signed)
Called Pt to schedule AWV with [facility] - knb °

## 2016-08-13 DIAGNOSIS — Z79891 Long term (current) use of opiate analgesic: Secondary | ICD-10-CM | POA: Diagnosis not present

## 2016-08-13 DIAGNOSIS — Z7901 Long term (current) use of anticoagulants: Secondary | ICD-10-CM | POA: Diagnosis not present

## 2016-08-13 DIAGNOSIS — Z96651 Presence of right artificial knee joint: Secondary | ICD-10-CM | POA: Diagnosis not present

## 2016-08-13 DIAGNOSIS — Z471 Aftercare following joint replacement surgery: Secondary | ICD-10-CM | POA: Diagnosis not present

## 2016-08-13 DIAGNOSIS — M1712 Unilateral primary osteoarthritis, left knee: Secondary | ICD-10-CM | POA: Diagnosis not present

## 2016-08-14 DIAGNOSIS — M25561 Pain in right knee: Secondary | ICD-10-CM | POA: Diagnosis not present

## 2016-08-14 DIAGNOSIS — G8929 Other chronic pain: Secondary | ICD-10-CM | POA: Diagnosis not present

## 2016-08-15 DIAGNOSIS — Z471 Aftercare following joint replacement surgery: Secondary | ICD-10-CM | POA: Diagnosis not present

## 2016-08-15 DIAGNOSIS — Z7901 Long term (current) use of anticoagulants: Secondary | ICD-10-CM | POA: Diagnosis not present

## 2016-08-15 DIAGNOSIS — Z79891 Long term (current) use of opiate analgesic: Secondary | ICD-10-CM | POA: Diagnosis not present

## 2016-08-15 DIAGNOSIS — Z96651 Presence of right artificial knee joint: Secondary | ICD-10-CM | POA: Diagnosis not present

## 2016-08-15 DIAGNOSIS — M1712 Unilateral primary osteoarthritis, left knee: Secondary | ICD-10-CM | POA: Diagnosis not present

## 2016-08-17 DIAGNOSIS — Z471 Aftercare following joint replacement surgery: Secondary | ICD-10-CM | POA: Diagnosis not present

## 2016-08-17 DIAGNOSIS — Z96651 Presence of right artificial knee joint: Secondary | ICD-10-CM | POA: Diagnosis not present

## 2016-08-17 DIAGNOSIS — Z79891 Long term (current) use of opiate analgesic: Secondary | ICD-10-CM | POA: Diagnosis not present

## 2016-08-17 DIAGNOSIS — Z7901 Long term (current) use of anticoagulants: Secondary | ICD-10-CM | POA: Diagnosis not present

## 2016-08-17 DIAGNOSIS — M1712 Unilateral primary osteoarthritis, left knee: Secondary | ICD-10-CM | POA: Diagnosis not present

## 2016-08-21 DIAGNOSIS — Z471 Aftercare following joint replacement surgery: Secondary | ICD-10-CM | POA: Diagnosis not present

## 2016-08-21 DIAGNOSIS — Z79891 Long term (current) use of opiate analgesic: Secondary | ICD-10-CM | POA: Diagnosis not present

## 2016-08-21 DIAGNOSIS — Z96651 Presence of right artificial knee joint: Secondary | ICD-10-CM | POA: Diagnosis not present

## 2016-08-21 DIAGNOSIS — Z7901 Long term (current) use of anticoagulants: Secondary | ICD-10-CM | POA: Diagnosis not present

## 2016-08-21 DIAGNOSIS — M1712 Unilateral primary osteoarthritis, left knee: Secondary | ICD-10-CM | POA: Diagnosis not present

## 2016-08-23 ENCOUNTER — Other Ambulatory Visit: Payer: Self-pay | Admitting: Family Medicine

## 2016-08-23 DIAGNOSIS — F4321 Adjustment disorder with depressed mood: Secondary | ICD-10-CM

## 2016-08-23 DIAGNOSIS — Z471 Aftercare following joint replacement surgery: Secondary | ICD-10-CM | POA: Diagnosis not present

## 2016-08-23 DIAGNOSIS — Z7901 Long term (current) use of anticoagulants: Secondary | ICD-10-CM | POA: Diagnosis not present

## 2016-08-23 DIAGNOSIS — Z79891 Long term (current) use of opiate analgesic: Secondary | ICD-10-CM | POA: Diagnosis not present

## 2016-08-23 DIAGNOSIS — Z96651 Presence of right artificial knee joint: Secondary | ICD-10-CM | POA: Diagnosis not present

## 2016-08-23 DIAGNOSIS — M1712 Unilateral primary osteoarthritis, left knee: Secondary | ICD-10-CM | POA: Diagnosis not present

## 2016-08-25 DIAGNOSIS — Z471 Aftercare following joint replacement surgery: Secondary | ICD-10-CM | POA: Diagnosis not present

## 2016-08-25 DIAGNOSIS — Z96651 Presence of right artificial knee joint: Secondary | ICD-10-CM | POA: Diagnosis not present

## 2016-08-25 DIAGNOSIS — Z79891 Long term (current) use of opiate analgesic: Secondary | ICD-10-CM | POA: Diagnosis not present

## 2016-08-25 DIAGNOSIS — M1712 Unilateral primary osteoarthritis, left knee: Secondary | ICD-10-CM | POA: Diagnosis not present

## 2016-08-25 DIAGNOSIS — Z7901 Long term (current) use of anticoagulants: Secondary | ICD-10-CM | POA: Diagnosis not present

## 2016-08-30 ENCOUNTER — Other Ambulatory Visit: Payer: Self-pay | Admitting: Family Medicine

## 2016-08-30 DIAGNOSIS — M25661 Stiffness of right knee, not elsewhere classified: Secondary | ICD-10-CM | POA: Diagnosis not present

## 2016-08-30 DIAGNOSIS — Z96651 Presence of right artificial knee joint: Secondary | ICD-10-CM | POA: Diagnosis not present

## 2016-08-30 DIAGNOSIS — Z4789 Encounter for other orthopedic aftercare: Secondary | ICD-10-CM | POA: Diagnosis not present

## 2016-09-01 DIAGNOSIS — Z4789 Encounter for other orthopedic aftercare: Secondary | ICD-10-CM | POA: Diagnosis not present

## 2016-09-01 DIAGNOSIS — Z96651 Presence of right artificial knee joint: Secondary | ICD-10-CM | POA: Diagnosis not present

## 2016-09-01 DIAGNOSIS — M25661 Stiffness of right knee, not elsewhere classified: Secondary | ICD-10-CM | POA: Diagnosis not present

## 2016-09-11 DIAGNOSIS — M25661 Stiffness of right knee, not elsewhere classified: Secondary | ICD-10-CM | POA: Diagnosis not present

## 2016-09-11 DIAGNOSIS — Z4789 Encounter for other orthopedic aftercare: Secondary | ICD-10-CM | POA: Diagnosis not present

## 2016-09-11 DIAGNOSIS — Z96651 Presence of right artificial knee joint: Secondary | ICD-10-CM | POA: Diagnosis not present

## 2016-09-13 DIAGNOSIS — Z4789 Encounter for other orthopedic aftercare: Secondary | ICD-10-CM | POA: Diagnosis not present

## 2016-09-13 DIAGNOSIS — Z96651 Presence of right artificial knee joint: Secondary | ICD-10-CM | POA: Diagnosis not present

## 2016-09-13 DIAGNOSIS — M25661 Stiffness of right knee, not elsewhere classified: Secondary | ICD-10-CM | POA: Diagnosis not present

## 2016-09-18 DIAGNOSIS — Z96651 Presence of right artificial knee joint: Secondary | ICD-10-CM | POA: Diagnosis not present

## 2016-09-18 DIAGNOSIS — Z4789 Encounter for other orthopedic aftercare: Secondary | ICD-10-CM | POA: Diagnosis not present

## 2016-09-18 DIAGNOSIS — M25661 Stiffness of right knee, not elsewhere classified: Secondary | ICD-10-CM | POA: Diagnosis not present

## 2016-09-21 DIAGNOSIS — Z96651 Presence of right artificial knee joint: Secondary | ICD-10-CM | POA: Diagnosis not present

## 2016-09-21 DIAGNOSIS — Z4789 Encounter for other orthopedic aftercare: Secondary | ICD-10-CM | POA: Diagnosis not present

## 2016-09-21 DIAGNOSIS — M25661 Stiffness of right knee, not elsewhere classified: Secondary | ICD-10-CM | POA: Diagnosis not present

## 2016-09-25 DIAGNOSIS — Z96651 Presence of right artificial knee joint: Secondary | ICD-10-CM | POA: Diagnosis not present

## 2016-09-25 DIAGNOSIS — M25661 Stiffness of right knee, not elsewhere classified: Secondary | ICD-10-CM | POA: Diagnosis not present

## 2016-09-25 DIAGNOSIS — Z4789 Encounter for other orthopedic aftercare: Secondary | ICD-10-CM | POA: Diagnosis not present

## 2016-09-28 DIAGNOSIS — Z4789 Encounter for other orthopedic aftercare: Secondary | ICD-10-CM | POA: Diagnosis not present

## 2016-09-28 DIAGNOSIS — Z96651 Presence of right artificial knee joint: Secondary | ICD-10-CM | POA: Diagnosis not present

## 2016-09-28 DIAGNOSIS — M25661 Stiffness of right knee, not elsewhere classified: Secondary | ICD-10-CM | POA: Diagnosis not present

## 2016-10-03 DIAGNOSIS — Z96651 Presence of right artificial knee joint: Secondary | ICD-10-CM | POA: Diagnosis not present

## 2016-10-03 DIAGNOSIS — M25661 Stiffness of right knee, not elsewhere classified: Secondary | ICD-10-CM | POA: Diagnosis not present

## 2016-10-03 DIAGNOSIS — Z4789 Encounter for other orthopedic aftercare: Secondary | ICD-10-CM | POA: Diagnosis not present

## 2016-10-05 DIAGNOSIS — Z4789 Encounter for other orthopedic aftercare: Secondary | ICD-10-CM | POA: Diagnosis not present

## 2016-10-05 DIAGNOSIS — M25661 Stiffness of right knee, not elsewhere classified: Secondary | ICD-10-CM | POA: Diagnosis not present

## 2016-10-05 DIAGNOSIS — Z96651 Presence of right artificial knee joint: Secondary | ICD-10-CM | POA: Diagnosis not present

## 2016-10-09 DIAGNOSIS — Z96651 Presence of right artificial knee joint: Secondary | ICD-10-CM | POA: Diagnosis not present

## 2016-10-09 DIAGNOSIS — Z4789 Encounter for other orthopedic aftercare: Secondary | ICD-10-CM | POA: Diagnosis not present

## 2016-10-09 DIAGNOSIS — M25661 Stiffness of right knee, not elsewhere classified: Secondary | ICD-10-CM | POA: Diagnosis not present

## 2016-10-12 DIAGNOSIS — Z4789 Encounter for other orthopedic aftercare: Secondary | ICD-10-CM | POA: Diagnosis not present

## 2016-10-12 DIAGNOSIS — Z96651 Presence of right artificial knee joint: Secondary | ICD-10-CM | POA: Diagnosis not present

## 2016-10-12 DIAGNOSIS — M25661 Stiffness of right knee, not elsewhere classified: Secondary | ICD-10-CM | POA: Diagnosis not present

## 2016-10-17 DIAGNOSIS — M25661 Stiffness of right knee, not elsewhere classified: Secondary | ICD-10-CM | POA: Diagnosis not present

## 2016-10-17 DIAGNOSIS — Z4789 Encounter for other orthopedic aftercare: Secondary | ICD-10-CM | POA: Diagnosis not present

## 2016-10-17 DIAGNOSIS — Z96651 Presence of right artificial knee joint: Secondary | ICD-10-CM | POA: Diagnosis not present

## 2016-10-19 DIAGNOSIS — Z96651 Presence of right artificial knee joint: Secondary | ICD-10-CM | POA: Diagnosis not present

## 2016-10-19 DIAGNOSIS — M25661 Stiffness of right knee, not elsewhere classified: Secondary | ICD-10-CM | POA: Diagnosis not present

## 2016-10-19 DIAGNOSIS — Z4789 Encounter for other orthopedic aftercare: Secondary | ICD-10-CM | POA: Diagnosis not present

## 2016-10-23 DIAGNOSIS — M25661 Stiffness of right knee, not elsewhere classified: Secondary | ICD-10-CM | POA: Diagnosis not present

## 2016-10-23 DIAGNOSIS — Z4789 Encounter for other orthopedic aftercare: Secondary | ICD-10-CM | POA: Diagnosis not present

## 2016-10-23 DIAGNOSIS — Z96651 Presence of right artificial knee joint: Secondary | ICD-10-CM | POA: Diagnosis not present

## 2016-12-12 DIAGNOSIS — Z96651 Presence of right artificial knee joint: Secondary | ICD-10-CM | POA: Diagnosis not present

## 2016-12-15 DIAGNOSIS — G8929 Other chronic pain: Secondary | ICD-10-CM | POA: Diagnosis not present

## 2016-12-15 DIAGNOSIS — M25562 Pain in left knee: Secondary | ICD-10-CM | POA: Diagnosis not present

## 2017-02-21 ENCOUNTER — Other Ambulatory Visit: Payer: Self-pay | Admitting: Family Medicine

## 2017-02-21 DIAGNOSIS — F4321 Adjustment disorder with depressed mood: Secondary | ICD-10-CM

## 2017-04-03 ENCOUNTER — Other Ambulatory Visit: Payer: Self-pay | Admitting: Family Medicine

## 2017-04-03 DIAGNOSIS — Z1231 Encounter for screening mammogram for malignant neoplasm of breast: Secondary | ICD-10-CM

## 2017-04-04 ENCOUNTER — Ambulatory Visit (INDEPENDENT_AMBULATORY_CARE_PROVIDER_SITE_OTHER): Payer: Medicare Other

## 2017-04-04 VITALS — BP 142/76 | HR 80 | Temp 98.7°F | Resp 12 | Ht 67.0 in | Wt 198.6 lb

## 2017-04-04 DIAGNOSIS — Z0000 Encounter for general adult medical examination without abnormal findings: Secondary | ICD-10-CM | POA: Diagnosis not present

## 2017-04-04 NOTE — Progress Notes (Signed)
Subjective:   Lynn Mccoy is a 76 y.o. female who presents for Medicare Annual (Subsequent) preventive examination.  Review of Systems:  N/A Cardiac Risk Factors include: advanced age (>73men, >82 women);dyslipidemia;sedentary lifestyle;obesity (BMI >30kg/m2)     Objective:     Vitals: BP (!) 142/76 (BP Location: Right Arm, Patient Position: Sitting, Cuff Size: Normal)   Pulse 80   Temp 98.7 F (37.1 C) (Oral)   Resp 12   Ht 5\' 7"  (1.702 m)   Wt 198 lb 9.6 oz (90.1 kg)   BMI 31.11 kg/m   Body mass index is 31.11 kg/m.   Tobacco History  Smoking Status  . Never Smoker  Smokeless Tobacco  . Never Used     Counseling given: Not Answered   Past Medical History:  Diagnosis Date  . Cancer ([employer])    ovarian ca  . Depression   . History of cervical cancer 12/29/2014   stage 4, s/p complete hysterectomy   . History of mumps as a child   . Hyperlipidemia   . Osteoarthrosis   . Osteopenia    Past Surgical History:  Procedure Laterality Date  . ABDOMINAL HYSTERECTOMY  1977   due to cervical cancer stage 4  . APPENDECTOMY  1977  . arthritis Bilateral   . REPLACEMENT TOTAL KNEE Right   . ROTATOR CUFF REPAIR Right about 2006   Dr. Sabra Heck   Family History  Problem Relation Age of Onset  . Leukemia Mother   . Lung cancer Father   . Dementia Sister   . Alzheimer's disease Sister   . Heart attack Brother   . Rheum arthritis Sister   . COPD Sister   . Breast cancer Neg Hx    History  Sexual Activity  . Sexual activity: Not Currently    Outpatient Encounter Prescriptions as of 04/04/2017  Medication Sig  . aspirin 81 MG chewable tablet Chew 1 tablet by mouth daily.  . cholecalciferol (VITAMIN D) 1000 UNITS tablet Take 1 tablet by mouth daily.  . Cyanocobalamin (VITAMIN B 12) 100 MCG LOZG Take 1 tablet by mouth daily.  Marland Kitchen loratadine (CLARITIN) 10 MG tablet Take 1 tablet by mouth daily.   . montelukast (SINGULAIR) 10 MG tablet TAKE ONE TABLET BY MOUTH  EVERY NIGHT AT BEDTIME  . naproxen sodium (ANAPROX) 220 MG tablet Take 220 mg by mouth 2 (two) times daily with a meal.  . sertraline (ZOLOFT) 100 MG tablet TAKE ONE TABLET BY MOUTH EVERY DAY  . traZODone (DESYREL) 100 MG tablet TAKE 1/2 TO 1 TABLET BY MOUTH ONE HOUR BEFORE BEDTIME  . Misc Natural Products (OSTEO BI-FLEX ADV DOUBLE ST) CAPS Take by mouth.   No facility-administered encounter medications on file as of 04/04/2017.     Activities of Daily Living In your present state of health, do you have any difficulty performing the following activities: 04/04/2017  Hearing? N  Vision? N  Difficulty concentrating or making decisions? N  Walking or climbing stairs? Y  Comment Right knee reapir and arthritis  Dressing or bathing? N  Doing errands, shopping? N  Preparing Food and eating ? N  Using the Toilet? N  In the past six months, have you accidently leaked urine? N  Do you have problems with loss of bowel control? N  Managing your Medications? N  Managing your Finances? N  Housekeeping or managing your Housekeeping? N  Some recent data might be hidden    Patient Care Team: Birdie Sons, MD  as PCP - General (Family Medicine) Leanor Kail, MD (Unknown Physician Specialty) Emmaline Kluver., MD (Rheumatology)    Assessment:     Exercise Activities and Dietary recommendations Current Exercise Habits: Home exercise routine, Type of exercise: Other - see comments (stationary bike), Time (Minutes): 15, Frequency (Times/Week): 7, Weekly Exercise (Minutes/Week): 105, Intensity: Mild  Goals    . Increase water intake          Recommend drinking 6-8 glasses of water per day. Due to foul odor with voiding, would also recommend adding Cranberry juice as well.      Fall Risk Fall Risk  04/04/2017 04/03/2016 03/24/2015  Falls in the past year? Yes No No  Number falls in past yr: 1 - -  Injury with Fall? No - -  Risk for fall due to : Impaired balance/gait;Impaired  vision;Medication side effect - -  Risk for fall due to: Comment Right knee repair and arthritis, eyeglasses - -  Follow up Education provided;Falls prevention discussed - -   Depression Screen PHQ 2/9 Scores 04/04/2017 04/03/2016 03/24/2015  PHQ - 2 Score 1 3 0  PHQ- 9 Score - 7 3     Cognitive Function     6CIT Screen 04/04/2017  What Year? 0 points  What month? 0 points  What time? 0 points  Count back from 20 0 points  Months in reverse 0 points  Repeat phrase 8 points  Total Score 8    Immunization History  Administered Date(s) Administered  . Pneumococcal Conjugate-13 03/12/2014  . Pneumococcal Polysaccharide-23 03/22/2005  . Td 01/19/1997, 11/23/2008  . Tdap 12/11/2015  . Zoster 10/28/2007   Screening Tests Health Maintenance  Topic Date Due  . INFLUENZA VACCINE  05/03/2018 (Originally 01/31/2017)  . TETANUS/TDAP  12/10/2025  . DEXA SCAN  Completed  . PNA vac Low Risk Adult  Completed      Plan:    I have personally reviewed and addressed the Medicare Annual Wellness questionnaire and have noted the following in the patient's chart:  A. Medical and social history B. Use of alcohol, tobacco or illicit drugs  C. Current medications and supplements D. Functional ability and status E.  Nutritional status F.  Physical activity G. Advance directives H. List of other physicians I.  Hospitalizations, surgeries, and ER visits in previous 12 months J.  [place] such as hearing and vision if needed, cognitive and depression L. Referrals and appointments - none  In addition, I have reviewed and discussed with patient certain preventive protocols, quality metrics, and best practice recommendations. A written personalized care plan for preventive services as well as general preventive health recommendations were provided to patient.  See attached scanned questionnaire for additional information.   Signed,  Aleatha Borer, LPN Nurse Health Advisor   MD  Recommendations: None

## 2017-04-04 NOTE — Patient Instructions (Signed)
Lynn Mccoy , Thank you for taking time to come for your Medicare Wellness Visit. I appreciate your ongoing commitment to your health goals. Please review the following plan we discussed and let me know if I can assist you in the future.   Screening recommendations/referrals: Colonoscopy: completed 04/06/14 Mammogram: completed 05/08/16 Bone Density: completed 04/28/14 Recommended yearly ophthalmology/optometry visit for glaucoma screening and checkup Recommended yearly dental visit for hygiene and checkup  Vaccinations: Influenza vaccine: declined Pneumococcal vaccine: completed series Tdap vaccine: up to date Shingles vaccine: up to date   Advanced directives: Please bring a copy of your POA (Power of Attorney) and/or Living Will to your next appointment.   Conditions/risks identified: Recommend drinking 6-8 glasses of water per day. Due to foul odor with voiding, would also recommend adding Cranberry juice as well.; Fall rsik prevention  Next appointment: Follow up with Dr. Caryn Section on April 11, 2017 @ 9:00am. Follow up annual wellness exam in one year.   Preventive Care 77 Years and Older, Female Preventive care refers to lifestyle choices and visits with your health care provider that can promote health and wellness. What does preventive care include?  A yearly physical exam. This is also called an annual well check.  Dental exams once or twice a year.  Routine eye exams. Ask your health care provider how often you should have your eyes checked.  Personal lifestyle choices, including:  Daily care of your teeth and gums.  Regular physical activity.  Eating a healthy diet.  Avoiding tobacco and drug use.  Limiting alcohol use.  Practicing safe sex.  Taking low-dose aspirin every day.  Taking vitamin and mineral supplements as recommended by your health care provider. What happens during an annual well check? The services and screenings done by your health care  provider during your annual well check will depend on your age, overall health, lifestyle risk factors, and family history of disease. Counseling  Your health care provider may ask you questions about your:  Alcohol use.  Tobacco use.  Drug use.  Emotional well-being.  Home and relationship well-being.  Sexual activity.  Eating habits.  History of falls.  Memory and ability to understand (cognition).  Work and work Statistician.  Reproductive health. Screening  You may have the following tests or measurements:  Height, weight, and BMI.  Blood pressure.  Lipid and cholesterol levels. These may be checked every 5 years, or more frequently if you are over 64 years old.  Skin check.  Lung cancer screening. You may have this screening every year starting at age 77 if you have a 30-pack-year history of smoking and currently smoke or have quit within the past 15 years.  Fecal occult blood test (FOBT) of the stool. You may have this test every year starting at age 77.  Flexible sigmoidoscopy or colonoscopy. You may have a sigmoidoscopy every 5 years or a colonoscopy every 10 years starting at age 77.  Hepatitis C blood test.  Hepatitis B blood test.  Sexually transmitted disease (STD) testing.  Diabetes screening. This is done by checking your blood sugar (glucose) after you have not eaten for a while (fasting). You may have this done every 1-3 years.  Bone density scan. This is done to screen for osteoporosis. You may have this done starting at age 77.  Mammogram. This may be done every 1-2 years. Talk to your health care provider about how often you should have regular mammograms. Talk with your health care provider about your test  results, treatment options, and if necessary, the need for more tests. Vaccines  Your health care provider may recommend certain vaccines, such as:  Influenza vaccine. This is recommended every year.  Tetanus, diphtheria, and acellular  pertussis (Tdap, Td) vaccine. You may need a Td booster every 10 years.  Zoster vaccine. You may need this after age 77.  Pneumococcal 13-valent conjugate (PCV13) vaccine. One dose is recommended after age 77.  Pneumococcal polysaccharide (PPSV23) vaccine. One dose is recommended after age 77. Talk to your health care provider about which screenings and vaccines you need and how often you need them. This information is not intended to replace advice given to you by your health care provider. Make sure you discuss any questions you have with your health care provider. Document Released: 07/16/2015 Document Revised: 03/08/2016 Document Reviewed: 04/20/2015 Elsevier Interactive Patient Education  2017 [employer] Prevention in the Home Falls can cause injuries. They can happen to people of all ages. There are many things you can do to make your home safe and to help prevent falls. What can I do on the outside of my home?  Regularly fix the edges of walkways and driveways and fix any cracks.  Remove anything that might make you trip as you walk through a door, such as a raised step or threshold.  Trim any bushes or trees on the path to your home.  Use bright outdoor lighting.  Clear any walking paths of anything that might make someone trip, such as rocks or tools.  Regularly check to see if handrails are loose or broken. Make sure that both sides of any steps have handrails.  Any raised decks and porches should have guardrails on the edges.  Have any leaves, snow, or ice cleared regularly.  Use sand or salt on walking paths during winter.  Clean up any spills in your garage right away. This includes oil or grease spills. What can I do in the bathroom?  Use night lights.  Install grab bars by the toilet and in the tub and shower. Do not use towel bars as grab bars.  Use non-skid mats or decals in the tub or shower.  If you need to sit down in the shower, use a plastic,  non-slip stool.  Keep the floor dry. Clean up any water that spills on the floor as soon as it happens.  Remove soap buildup in the tub or shower regularly.  Attach bath mats securely with double-sided non-slip rug tape.  Do not have throw rugs and other things on the floor that can make you trip. What can I do in the bedroom?  Use night lights.  Make sure that you have a light by your bed that is easy to reach.  Do not use any sheets or blankets that are too big for your bed. They should not hang down onto the floor.  Have a firm chair that has side arms. You can use this for support while you get dressed.  Do not have throw rugs and other things on the floor that can make you trip. What can I do in the kitchen?  Clean up any spills right away.  Avoid walking on wet floors.  Keep items that you use a lot in easy-to-reach places.  If you need to reach something above you, use a strong step stool that has a grab bar.  Keep electrical cords out of the way.  Do not use floor polish or wax that makes  floors slippery. If you must use wax, use non-skid floor wax.  Do not have throw rugs and other things on the floor that can make you trip. What can I do with my stairs?  Do not leave any items on the stairs.  Make sure that there are handrails on both sides of the stairs and use them. Fix handrails that are broken or loose. Make sure that handrails are as long as the stairways.  Check any carpeting to make sure that it is firmly attached to the stairs. Fix any carpet that is loose or worn.  Avoid having throw rugs at the top or bottom of the stairs. If you do have throw rugs, attach them to the floor with carpet tape.  Make sure that you have a light switch at the top of the stairs and the bottom of the stairs. If you do not have them, ask someone to add them for you. What else can I do to help prevent falls?  Wear shoes that:  Do not have high heels.  Have rubber  bottoms.  Are comfortable and fit you well.  Are closed at the toe. Do not wear sandals.  If you use a stepladder:  Make sure that it is fully opened. Do not climb a closed stepladder.  Make sure that both sides of the stepladder are locked into place.  Ask someone to hold it for you, if possible.  Clearly mark and make sure that you can see:  Any grab bars or handrails.  First and last steps.  Where the edge of each step is.  Use tools that help you move around (mobility aids) if they are needed. These include:  Canes.  Walkers.  Scooters.  Crutches.  Turn on the lights when you go into a dark area. Replace any light bulbs as soon as they burn out.  Set up your furniture so you have a clear path. Avoid moving your furniture around.  If any of your floors are uneven, fix them.  If there are any pets around you, be aware of where they are.  Review your medicines with your doctor. Some medicines can make you feel dizzy. This can increase your chance of falling. Ask your doctor what other things that you can do to help prevent falls. This information is not intended to replace advice given to you by your health care provider. Make sure you discuss any questions you have with your health care provider. Document Released: 04/15/2009 Document Revised: 11/25/2015 Document Reviewed: 07/24/2014 Elsevier Interactive Patient Education  2017 [employer].

## 2017-04-11 ENCOUNTER — Ambulatory Visit (INDEPENDENT_AMBULATORY_CARE_PROVIDER_SITE_OTHER): Payer: Medicare Other | Admitting: Family Medicine

## 2017-04-11 ENCOUNTER — Encounter: Payer: Self-pay | Admitting: Family Medicine

## 2017-04-11 VITALS — BP 148/70 | HR 72 | Temp 98.0°F | Resp 16 | Ht 65.75 in | Wt 199.0 lb

## 2017-04-11 DIAGNOSIS — Z6831 Body mass index (BMI) 31.0-31.9, adult: Secondary | ICD-10-CM | POA: Diagnosis not present

## 2017-04-11 DIAGNOSIS — E2839 Other primary ovarian failure: Secondary | ICD-10-CM

## 2017-04-11 DIAGNOSIS — G4700 Insomnia, unspecified: Secondary | ICD-10-CM | POA: Diagnosis not present

## 2017-04-11 DIAGNOSIS — Z23 Encounter for immunization: Secondary | ICD-10-CM

## 2017-04-11 DIAGNOSIS — E669 Obesity, unspecified: Secondary | ICD-10-CM

## 2017-04-11 DIAGNOSIS — J301 Allergic rhinitis due to pollen: Secondary | ICD-10-CM | POA: Diagnosis not present

## 2017-04-11 DIAGNOSIS — M8580 Other specified disorders of bone density and structure, unspecified site: Secondary | ICD-10-CM

## 2017-04-11 DIAGNOSIS — F329 Major depressive disorder, single episode, unspecified: Secondary | ICD-10-CM | POA: Diagnosis not present

## 2017-04-11 DIAGNOSIS — Z0000 Encounter for general adult medical examination without abnormal findings: Secondary | ICD-10-CM | POA: Diagnosis not present

## 2017-04-11 DIAGNOSIS — F32A Depression, unspecified: Secondary | ICD-10-CM

## 2017-04-11 LAB — COMPLETE METABOLIC PANEL WITH GFR
AG RATIO: 1.5 (calc) (ref 1.0–2.5)
ALBUMIN MSPROF: 4 g/dL (ref 3.6–5.1)
ALT: 11 U/L (ref 6–29)
AST: 15 U/L (ref 10–35)
Alkaline phosphatase (APISO): 66 U/L (ref 33–130)
BILIRUBIN TOTAL: 0.4 mg/dL (ref 0.2–1.2)
BUN: 16 mg/dL (ref 7–25)
CO2: 27 mmol/L (ref 20–32)
Calcium: 9.4 mg/dL (ref 8.6–10.4)
Chloride: 108 mmol/L (ref 98–110)
Creat: 0.69 mg/dL (ref 0.60–0.93)
GFR, EST AFRICAN AMERICAN: 97 mL/min/{1.73_m2} (ref >=60)
GFR, EST NON AFRICAN AMERICAN: 84 mL/min/{1.73_m2} (ref >=60)
GLOBULIN: 2.7 g/dL (ref 1.9–3.7)
Glucose, Bld: 98 mg/dL (ref 65–99)
POTASSIUM: 4.2 mmol/L (ref 3.5–5.3)
SODIUM: 143 mmol/L (ref 135–146)
TOTAL PROTEIN: 6.7 g/dL (ref 6.1–8.1)

## 2017-04-11 NOTE — Progress Notes (Signed)
Patient: Lynn Mccoy, Female    DOB: 17-Dec-1940, 76 y.o.   MRN: [id number] Visit Date: 04/11/2017  Today's Provider: [name], MD   Chief Complaint  Patient presents with  . Annual Exam  . Insomnia   Subjective:    Annual physical exam Lynn Mccoy is a 76 y.o. female who presents today for health maintenance and complete physical. She feels fairly well. She reports exercising daily. She reports she is sleeping fairly well.  ----------------------------------------------------------------- Follow up of Adjustment disorder with Depressed mood:  Patient was last seen for this problem 1 year ago. Changes made includes increasing Zoloft to 100mg  daily. Patient reports good compliance with treatment, good tolerance and good symptom control.   Follow up of Insomnia:  Patient was last seem for this problem 1 year ago and no changes were made. Patient reports this condition is stable. She reports she still has to get up every 2 hours due to nocturia. She reports she takes Trazodone every other night.   Review of Systems  Constitutional: Negative for chills, fatigue and fever.  HENT: Positive for drooling and mouth sores. Negative for congestion, ear pain, rhinorrhea, sneezing and sore throat.   Eyes: Positive for photophobia. Negative for pain and redness.  Respiratory: Negative for cough, shortness of breath and wheezing.   Cardiovascular: Positive for leg swelling. Negative for chest pain.  Gastrointestinal: Positive for abdominal distention. Negative for abdominal pain, blood in stool, constipation, diarrhea and nausea.  Endocrine: Negative for polydipsia and polyphagia.  Genitourinary: Negative.  Negative for dysuria, flank pain, hematuria, pelvic pain, vaginal bleeding and vaginal discharge.  Musculoskeletal: Positive for arthralgias and joint swelling. Negative for back pain and gait problem.  Skin: Negative for rash.  Neurological: Positive for  light-headedness. Negative for dizziness, tremors, seizures, weakness, numbness and headaches.  Hematological: Negative for adenopathy.  Psychiatric/Behavioral: Negative.  Negative for behavioral problems, confusion and dysphoric mood. The patient is not nervous/anxious and is not hyperactive.     Social History      She  reports that she has never smoked. She has never used smokeless tobacco. She reports that she drinks about 1.2 oz of alcohol per week . She reports that she does not use drugs.       Social History   Social History  . Marital status: Widowed    Spouse name: N/A  . Number of children: 3  . Years of education: 12   Occupational History  . Retired     former Haematologist   Social History Main Topics  . Smoking status: Never Smoker  . Smokeless tobacco: Never Used  . Alcohol use 1.2 oz/week    1 Standard drinks or equivalent, 1 Glasses of wine per week     Comment: occasional use  . Drug use: No  . Sexual activity: Not Currently   Other Topics Concern  . None   Social History Narrative  . None    Past Medical History:  Diagnosis Date  . Cancer ([place])    ovarian ca  . Depression   . History of cervical cancer 12/29/2014   stage 4, s/p complete hysterectomy   . History of mumps as a child   . Hyperlipidemia   . Osteoarthrosis   . Osteopenia      Patient Active Problem List   Diagnosis Date Noted  . Allergic rhinitis 01/22/2015  . Depression 12/29/2014  . Fatigue 12/29/2014  . Arthritis, degenerative 11/06/2013  . History  of repair of rotator cuff 11/06/2013  . Insomnia 12/06/2009  . Adjustment disorder with depressed mood 01/13/2009  . Osteopenia 11/24/2008  . Diverticulosis of colon without hemorrhage 10/29/2008  . Obesity 10/29/2008  . Osteoarthrosis 10/29/2008    Past Surgical History:  Procedure Laterality Date  . ABDOMINAL HYSTERECTOMY  1977   due to cervical cancer stage 4  . APPENDECTOMY  1977  . arthritis Bilateral   .  REPLACEMENT TOTAL KNEE Right   . ROTATOR CUFF REPAIR Right about 2006   Dr. Sabra Heck    Family History        Family Status  Relation Status  . Mother Deceased       Cause of Death: Leukemia  . Father Deceased       Cause of Death: Lung Cancer  . Sister Deceased  . Brother Deceased       Cause of death: MI  . Sister Alive  . Neg Hx (Not Specified)        Her family history includes Alzheimer's disease in her sister; COPD in her sister; Dementia in her sister; Heart attack in her brother; Leukemia in her mother; Lung cancer in her father; Rheum arthritis in her sister.     No Known Allergies   Current Outpatient Prescriptions:  .  aspirin 81 MG chewable tablet, Chew 1 tablet by mouth daily., Disp: , Rfl:  .  cholecalciferol (VITAMIN D) 1000 UNITS tablet, Take 1 tablet by mouth daily., Disp: , Rfl:  .  Cyanocobalamin (VITAMIN B 12) 100 MCG LOZG, Take 1 tablet by mouth daily., Disp: , Rfl:  .  loratadine (CLARITIN) 10 MG tablet, Take 1 tablet by mouth daily. , Disp: , Rfl:  .  montelukast (SINGULAIR) 10 MG tablet, TAKE ONE TABLET BY MOUTH EVERY NIGHT AT BEDTIME, Disp: 30 tablet, Rfl: 11 .  naproxen sodium (ANAPROX) 220 MG tablet, Take 220 mg by mouth 2 (two) times daily with a meal., Disp: , Rfl:  .  sertraline (ZOLOFT) 100 MG tablet, TAKE ONE TABLET BY MOUTH EVERY DAY, Disp: 30 tablet, Rfl: 2 .  traZODone (DESYREL) 100 MG tablet, TAKE 1/2 TO 1 TABLET BY MOUTH ONE HOUR BEFORE BEDTIME, Disp: 30 tablet, Rfl: 12   Patient Care Team: Birdie Sons, MD as PCP - General (Family Medicine) Leanor Kail, MD (Unknown Physician Specialty) Emmaline Kluver., MD (Rheumatology)      Objective:   Vitals: BP (!) 148/70 (BP Location: Right Arm, Cuff Size: Large)   Pulse 72   Temp 98 F (36.7 C) (Oral)   Resp 16   Ht 5' 5.75" (1.67 m)   Wt 199 lb (90.3 kg)   SpO2 96% Comment: room air  BMI 32.36 kg/m    Vitals:   04/11/17 0908 04/11/17 0915  BP: (!) 150/70 (!) 148/70    Pulse: 72   Resp: 16   Temp: 98 F (36.7 C)   TempSrc: Oral   SpO2: 96%   Weight: 199 lb (90.3 kg)   Height: 5' 5.75" (1.67 m)      Physical Exam   General Appearance:    Alert, cooperative, no distress, appears stated age  Head:    Normocephalic, without obvious abnormality, atraumatic  Eyes:    PERRL, conjunctiva/corneas clear, EOM's intact, fundi    benign, both eyes  Ears:    Normal TM's and external ear canals, both ears  Nose:   Nares normal, septum midline, mucosa normal, no drainage    or sinus  tenderness  Throat:   Lips, mucosa, and tongue normal; teeth and gums normal  Neck:   Supple, symmetrical, trachea midline, no adenopathy;    thyroid:  no enlargement/tenderness/nodules; no carotid   bruit or JVD  Back:     Symmetric, no curvature, ROM normal, no CVA tenderness  Lungs:     Clear to auscultation bilaterally, respirations unlabored  Chest Wall:    No tenderness or deformity   Heart:    Regular rate and rhythm, S1 and S2 normal, no murmur, rub   or gallop  Breast Exam:    normal appearance, no masses or tenderness  Abdomen:     Soft, non-tender, bowel sounds active all four quadrants,    no masses, no organomegaly  Pelvic:    deferred  Extremities:   Extremities normal, atraumatic, no cyanosis or edema  Pulses:   2+ and symmetric all extremities  Skin:   Skin color, texture, turgor normal, no rashes or lesions  Lymph nodes:   Cervical, supraclavicular, and axillary nodes normal  Neurologic:   CNII-XII intact, normal strength, sensation and reflexes    throughout    Depression Screen PHQ 2/9 Scores 04/04/2017 04/03/2016 03/24/2015  PHQ - 2 Score 1 3 0  PHQ- 9 Score - 7 3      Assessment & Plan:     Routine Health Maintenance and Physical Exam  Exercise Activities and Dietary recommendations Goals    . Increase water intake          Recommend drinking 6-8 glasses of water per day. Due to foul odor with voiding, would also recommend adding Cranberry  juice as well.       Immunization History  Administered Date(s) Administered  . Pneumococcal Conjugate-13 03/12/2014  . Pneumococcal Polysaccharide-23 03/22/2005  . Td 01/19/1997, 11/23/2008  . Tdap 12/11/2015  . Zoster 10/28/2007    Health Maintenance  Topic Date Due  . INFLUENZA VACCINE  05/03/2018 (Originally 01/31/2017)  . TETANUS/TDAP  12/10/2025  . DEXA SCAN  Completed  . PNA vac Low Risk Adult  Completed     Discussed health benefits of physical activity, and encouraged her to engage in regular exercise appropriate for her age and condition.    --------------------------------------------------------------------  1. Annual physical exam  - Comprehensive Metabolic Panel (CMET)  2. Allergic rhinitis due to pollen, unspecified seasonality Doing well with montelukast.   3. Osteopenia, unspecified location Due for BMD  4. Depression, unspecified depression type Doing well with sertraline.   5. Insomnia, unspecified type Doing well with prn trazodone.   6. Need for influenza vaccination  - Flu vaccine HIGH DOSE PF (Fluzone High dose)  7. Estrogen deficiency  - DG Bone Density; Future  8. Class 1 obesity without serious comorbidity with body mass index (BMI) of 31.0 to 31.9 in adult, unspecified obesity type Encouraged prudent diet and regular exercise.  - Comprehensive Metabolic Panel (CMET)   [name], MD  [facility]

## 2017-04-11 NOTE — Patient Instructions (Signed)
   Please contact your eyecare professional to schedule a routine eye exam  

## 2017-04-18 ENCOUNTER — Other Ambulatory Visit: Payer: Self-pay | Admitting: Family Medicine

## 2017-05-09 ENCOUNTER — Ambulatory Visit
Admission: RE | Admit: 2017-05-09 | Discharge: 2017-05-09 | Disposition: A | Discharge status: Home / Self Care | Payer: Medicare Other | Source: Ambulatory Visit | Attending: Family Medicine | Admitting: Family Medicine

## 2017-05-09 DIAGNOSIS — Z1231 Encounter for screening mammogram for malignant neoplasm of breast: Secondary | ICD-10-CM

## 2017-05-22 ENCOUNTER — Ambulatory Visit
Admission: RE | Admit: 2017-05-22 | Discharge: 2017-05-22 | Disposition: A | Discharge status: Home / Self Care | Payer: Medicare Other | Source: Ambulatory Visit | Attending: Family Medicine | Admitting: Family Medicine

## 2017-05-22 ENCOUNTER — Telehealth: Payer: Self-pay

## 2017-05-22 DIAGNOSIS — M8588 Other specified disorders of bone density and structure, other site: Secondary | ICD-10-CM | POA: Diagnosis not present

## 2017-05-22 DIAGNOSIS — E2839 Other primary ovarian failure: Secondary | ICD-10-CM

## 2017-05-22 DIAGNOSIS — M85852 Other specified disorders of bone density and structure, left thigh: Secondary | ICD-10-CM | POA: Diagnosis not present

## 2017-05-22 NOTE — Telephone Encounter (Signed)
-----   Message from Birdie Sons, MD sent at 05/22/2017 11:42 AM EST ----- Normal BMD. Recheck 3-5 years.

## 2017-05-22 NOTE — Telephone Encounter (Signed)
Patient advised as below.  

## 2017-06-07 ENCOUNTER — Other Ambulatory Visit: Payer: Self-pay | Admitting: Family Medicine

## 2017-06-07 DIAGNOSIS — F4321 Adjustment disorder with depressed mood: Secondary | ICD-10-CM

## 2017-06-07 NOTE — Telephone Encounter (Signed)
Pharmacy requesting refills. Thanks!  

## 2017-06-28 DIAGNOSIS — Z96651 Presence of right artificial knee joint: Secondary | ICD-10-CM | POA: Diagnosis not present

## 2017-07-02 DIAGNOSIS — M25562 Pain in left knee: Secondary | ICD-10-CM | POA: Diagnosis not present

## 2017-07-02 DIAGNOSIS — G8929 Other chronic pain: Secondary | ICD-10-CM | POA: Diagnosis not present

## 2017-07-02 DIAGNOSIS — M1711 Unilateral primary osteoarthritis, right knee: Secondary | ICD-10-CM | POA: Diagnosis not present

## 2017-07-02 DIAGNOSIS — M25511 Pain in right shoulder: Secondary | ICD-10-CM | POA: Diagnosis not present

## 2017-09-07 ENCOUNTER — Other Ambulatory Visit: Payer: Self-pay | Admitting: Family Medicine

## 2017-10-02 DIAGNOSIS — M1711 Unilateral primary osteoarthritis, right knee: Secondary | ICD-10-CM | POA: Diagnosis not present

## 2017-10-02 DIAGNOSIS — M65331 Trigger finger, right middle finger: Secondary | ICD-10-CM | POA: Diagnosis not present

## 2017-12-27 DIAGNOSIS — M8580 Other specified disorders of bone density and structure, unspecified site: Secondary | ICD-10-CM | POA: Diagnosis not present

## 2017-12-27 DIAGNOSIS — Z96651 Presence of right artificial knee joint: Secondary | ICD-10-CM | POA: Diagnosis not present

## 2018-02-19 DIAGNOSIS — H2589 Other age-related cataract: Secondary | ICD-10-CM | POA: Diagnosis not present

## 2018-02-27 DIAGNOSIS — H2589 Other age-related cataract: Secondary | ICD-10-CM | POA: Diagnosis not present

## 2018-02-27 NOTE — Discharge Instructions (Signed)

## 2018-02-28 ENCOUNTER — Other Ambulatory Visit: Payer: Self-pay

## 2018-02-28 ENCOUNTER — Encounter: Payer: Self-pay | Admitting: *Deleted

## 2018-03-06 ENCOUNTER — Encounter
Admission: RE | Disposition: A | Discharge status: Home / Self Care | Payer: Self-pay | Source: Ambulatory Visit | Attending: Ophthalmology

## 2018-03-06 ENCOUNTER — Ambulatory Visit: Payer: Medicare Other | Admitting: Anesthesiology

## 2018-03-06 ENCOUNTER — Ambulatory Visit
Admission: RE | Admit: 2018-03-06 | Discharge: 2018-03-06 | Disposition: A | Discharge status: Home / Self Care | Payer: Medicare Other | Source: Ambulatory Visit | Attending: Ophthalmology | Admitting: Ophthalmology

## 2018-03-06 DIAGNOSIS — H2589 Other age-related cataract: Secondary | ICD-10-CM | POA: Diagnosis not present

## 2018-03-06 DIAGNOSIS — Z96659 Presence of unspecified artificial knee joint: Secondary | ICD-10-CM | POA: Diagnosis not present

## 2018-03-06 DIAGNOSIS — H5703 Miosis: Secondary | ICD-10-CM | POA: Insufficient documentation

## 2018-03-06 DIAGNOSIS — H2512 Age-related nuclear cataract, left eye: Secondary | ICD-10-CM | POA: Insufficient documentation

## 2018-03-06 DIAGNOSIS — H25812 Combined forms of age-related cataract, left eye: Secondary | ICD-10-CM | POA: Diagnosis not present

## 2018-03-06 HISTORY — PX: CATARACT EXTRACTION W/PHACO: SHX586

## 2018-03-06 HISTORY — DX: Presence of dental prosthetic device (complete) (partial): Z97.2

## 2018-03-06 SURGERY — PHACOEMULSIFICATION, CATARACT, WITH IOL INSERTION
Anesthesia: Monitor Anesthesia Care | Site: Eye | Laterality: Left | Wound class: Clean

## 2018-03-06 MED ORDER — EPINEPHRINE PF 1 MG/ML IJ SOLN
INTRAOCULAR | Status: DC | PRN
  Administered 2018-03-06: 70 mL via OPHTHALMIC

## 2018-03-06 MED ORDER — LIDOCAINE HCL (PF) 2 % IJ SOLN
INTRAOCULAR | Status: DC | PRN
  Administered 2018-03-06: 1 mL

## 2018-03-06 MED ORDER — SODIUM HYALURONATE 23 MG/ML IO SOLN
INTRAOCULAR | Status: DC | PRN
  Administered 2018-03-06: 0.6 mL via INTRAOCULAR

## 2018-03-06 MED ORDER — ACETAMINOPHEN 325 MG PO TABS: 325.0000 mg | ORAL_TABLET | ORAL | Status: DC | PRN

## 2018-03-06 MED ORDER — BRIMONIDINE TARTRATE-TIMOLOL 0.2-0.5 % OP SOLN
OPHTHALMIC | Status: DC | PRN
  Administered 2018-03-06: 1 [drp] via OPHTHALMIC

## 2018-03-06 MED ORDER — MOXIFLOXACIN HCL 0.5 % OP SOLN
1.0000 [drp] | OPHTHALMIC | Status: DC | PRN
  Administered 2018-03-06 (×3): 1 [drp] via OPHTHALMIC

## 2018-03-06 MED ORDER — CEFUROXIME OPHTHALMIC INJECTION 1 MG/0.1 ML
INJECTION | OPHTHALMIC | Status: DC | PRN
  Administered 2018-03-06: 0.1 mL via OPHTHALMIC

## 2018-03-06 MED ORDER — NA HYALUR & NA CHOND-NA HYALUR 0.4-0.35 ML IO KIT
PACK | INTRAOCULAR | Status: DC | PRN
  Administered 2018-03-06: 1 mL via INTRAOCULAR

## 2018-03-06 MED ORDER — ACETAMINOPHEN 160 MG/5ML PO SOLN: 325.0000 mg | ORAL | Status: DC | PRN

## 2018-03-06 MED ORDER — ARMC OPHTHALMIC DILATING DROPS
1.0000 "application " | OPHTHALMIC | Status: DC | PRN
  Administered 2018-03-06 (×3): 1 via OPHTHALMIC

## 2018-03-06 MED ORDER — MIDAZOLAM HCL 2 MG/2ML IJ SOLN
INTRAMUSCULAR | Status: DC | PRN
  Administered 2018-03-06: 2 mg via INTRAVENOUS

## 2018-03-06 MED ORDER — TRYPAN BLUE 0.06 % OP SOLN
OPHTHALMIC | Status: DC | PRN
  Administered 2018-03-06: 0.5 mL via INTRAOCULAR

## 2018-03-06 MED ORDER — FENTANYL CITRATE (PF) 100 MCG/2ML IJ SOLN
INTRAMUSCULAR | Status: DC | PRN
  Administered 2018-03-06: 50 ug via INTRAVENOUS

## 2018-03-06 SURGICAL SUPPLY — 27 items
CANNULA ANT/CHMB 27G (MISCELLANEOUS) ×1 IMPLANT
CANNULA ANT/CHMB 27GA (MISCELLANEOUS) ×3 IMPLANT
CARTRIDGE ABBOTT (MISCELLANEOUS) IMPLANT
GLOVE SURG LX 7.5 STRW (GLOVE) ×2
GLOVE SURG LX STRL 7.5 STRW (GLOVE) ×1 IMPLANT
GLOVE SURG TRIUMPH 8.0 PF LTX (GLOVE) ×3 IMPLANT
GOWN STRL REUS W/ TWL LRG LVL3 (GOWN DISPOSABLE) ×2 IMPLANT
GOWN STRL REUS W/TWL LRG LVL3 (GOWN DISPOSABLE) ×6
LENS IOL TECNIS ITEC 22.0 (Intraocular Lens) ×2 IMPLANT
MARKER SKIN DUAL TIP RULER LAB (MISCELLANEOUS) ×3 IMPLANT
NDL FILTER BLUNT 18X1 1/2 (NEEDLE) ×1 IMPLANT
NDL RETROBULBAR .5 NSTRL (NEEDLE) IMPLANT
NEEDLE FILTER BLUNT 18X 1/2SAF (NEEDLE) ×2
NEEDLE FILTER BLUNT 18X1 1/2 (NEEDLE) ×1 IMPLANT
PACK CATARACT BRASINGTON (MISCELLANEOUS) ×3 IMPLANT
PACK EYE AFTER SURG (MISCELLANEOUS) ×3 IMPLANT
PACK OPTHALMIC (MISCELLANEOUS) ×3 IMPLANT
RING MALYGIN 7.0 (MISCELLANEOUS) ×2 IMPLANT
SUT ETHILON 10-0 CS-B-6CS-B-6 (SUTURE)
SUT VICRYL  9 0 (SUTURE)
SUT VICRYL 9 0 (SUTURE) IMPLANT
SUTURE EHLN 10-0 CS-B-6CS-B-6 (SUTURE) IMPLANT
SYR 3ML LL SCALE MARK (SYRINGE) ×3 IMPLANT
SYR 5ML LL (SYRINGE) ×3 IMPLANT
SYR TB 1ML LUER SLIP (SYRINGE) ×3 IMPLANT
WATER STERILE IRR 500ML POUR (IV SOLUTION) ×3 IMPLANT
WIPE NON LINTING 3.25X3.25 (MISCELLANEOUS) ×3 IMPLANT

## 2018-03-06 NOTE — Anesthesia Preprocedure Evaluation (Addendum)
Anesthesia Evaluation  Patient identified by MRN, date of birth, ID band Patient awake    Reviewed: Allergy & Precautions, H&P , NPO status , Patient's Chart, lab work & pertinent test results, reviewed documented beta blocker date and time   Airway Mallampati: II  TM Distance: >3 FB Neck ROM: full    Dental  (+) Upper Dentures, Lower Dentures   Pulmonary neg pulmonary ROS,    Pulmonary exam normal breath sounds clear to auscultation       Cardiovascular Exercise Tolerance: Good negative cardio ROS   Rhythm:regular Rate:Normal     Neuro/Psych Depression negative neurological ROS     GI/Hepatic negative GI ROS, Neg liver ROS,   Endo/Other  negative endocrine ROS  Renal/GU negative Renal ROS  negative genitourinary   Musculoskeletal   Abdominal   Peds  Hematology negative hematology ROS (+)   Anesthesia Other Findings   Reproductive/Obstetrics negative OB ROS                             Anesthesia Physical Anesthesia Plan  ASA: II  Anesthesia Plan: MAC   Post-op Pain Management:    Induction:   PONV Risk Score and Plan:   Airway Management Planned:   Additional Equipment:   Intra-op Plan:   Post-operative Plan:   Informed Consent: I have reviewed the patients History and Physical, chart, labs and discussed the procedure including the risks, benefits and alternatives for the proposed anesthesia with the patient or authorized representative who has indicated his/her understanding and acceptance.   Dental Advisory Given  Plan Discussed with: CRNA  Anesthesia Plan Comments:        Anesthesia Quick Evaluation

## 2018-03-06 NOTE — Anesthesia Postprocedure Evaluation (Signed)
Anesthesia Post Note  Patient: Lynn Mccoy  Procedure(s) Performed: CATARACT EXTRACTION PHACO AND INTRAOCULAR LENS PLACEMENT (IOC) COMPLICATED LEFT (Left Eye)  Patient location during evaluation: PACU Anesthesia Type: MAC Level of consciousness: awake and alert Pain management: pain level controlled Vital Signs Assessment: post-procedure vital signs reviewed and stable Respiratory status: spontaneous breathing, nonlabored ventilation, respiratory function stable and patient connected to nasal cannula oxygen Cardiovascular status: stable and blood pressure returned to baseline Postop Assessment: no apparent nausea or vomiting Anesthetic complications: no    Alisa Graff

## 2018-03-06 NOTE — Anesthesia Procedure Notes (Signed)
Procedure Name: MAC Date/Time: 03/06/2018 11:07 AM Performed by: Janna Arch, CRNA Pre-anesthesia Checklist: Patient identified, Emergency Drugs available, Suction available and Patient being monitored Patient Re-evaluated:Patient Re-evaluated prior to induction Oxygen Delivery Method: Nasal cannula

## 2018-03-06 NOTE — Transfer of Care (Signed)
Immediate Anesthesia Transfer of Care Note  Patient: Lynn Mccoy  Procedure(s) Performed: CATARACT EXTRACTION PHACO AND INTRAOCULAR LENS PLACEMENT (IOC) COMPLICATED LEFT (Left Eye)  Patient Location: PACU  Anesthesia Type: MAC  Level of Consciousness: awake, alert  and patient cooperative  Airway and Oxygen Therapy: Patient Spontanous Breathing and Patient connected to supplemental oxygen  Post-op Assessment: Post-op Vital signs reviewed, Patient's Cardiovascular Status Stable, Respiratory Function Stable, Patent Airway and No signs of Nausea or vomiting  Post-op Vital Signs: Reviewed and stable  Complications: No apparent anesthesia complications

## 2018-03-06 NOTE — Op Note (Signed)
OPERATIVE NOTE  Lynn Mccoy [id number] 03/06/2018  PREOPERATIVE DIAGNOSIS:   Mature Nuclear sclerotic cataract left eye with miotic pupil      H25.89   POSTOPERATIVE DIAGNOSIS:  Mature Nuclear sclerotic cataract left eye with miotic pupil.     PROCEDURE:  Phacoemulsification with posterior chamber intraocular lens implantation of the left eye which required pupil stretching with the Malyugin pupil expansion device. Vision blue dye was used to stain the lens capsule.   LENS:   Implant Name Type Inv. Item Serial No. Manufacturer Lot No. LRB No. Used  LENS IOL DIOP 22.0 - [id number] Intraocular Lens LENS IOL DIOP 22.0 [id number]  Left 1        ULTRASOUND TIME: 21 % of 1 minutes, 10 seconds.  CDE 15.2   SURGEON:  Wyonia Hough, MD   ANESTHESIA: Topical with tetracaine drops and 2% Xylocaine jelly, augmented with 1% preservative-free intracameral lidocaine.   COMPLICATIONS:  None.   DESCRIPTION OF PROCEDURE:  The patient was identified in the holding room and transported to the operating room and placed in the supine position under the operating microscope.  The left eye was identified as the operative eye and it was prepped and draped in the usual sterile ophthalmic fashion.   A 1 millimeter clear-corneal paracentesis was made at the 1:30 position.  The anterior chamber was filled with Viscoat viscoelastic.  0.5 ml of preservative-free 1% lidocaine was injected into the anterior chamber.  A 2.4 millimeter keratome was used to make a near-clear corneal incision at the 10:30 position.  A Malyugin pupil expander was then placed through the main incision and into the anterior chamber of the eye.  The edge of the iris was secured on the lip of the pupil expander and it was released, thereby expanding the pupil to approximately 7 millimeters for completion of the cataract surgery.  Additional Healon 5 viscoelastic was placed in the anterior chamber. Vision blue dye was used to stain  the anterior capsule.  It was washed out with balanced salt.  Additional Healon 5 was placed into the anterior chamber.   A cystotome and capsulorrhexis forceps were used to make a curvilinear capsulorrhexis.   Balanced salt solution was used to hydrodissect and hydrodelineate the lens nucleus.   Phacoemulsification was used in stop and chop fashion to remove the lens, nucleus and epinucleus.  The remaining cortex was aspirated using the irrigation aspiration handpiece.  Additional Provisc was placed into the eye to distend the capsular bag for lens placement.  A lens was then injected into the capsular bag.  The pupil expanding ring was removed using a Kuglen hook and insertion device. The remaining viscoelastic was aspirated from the capsular bag and the anterior chamber.  The anterior chamber was filled with balanced salt solution to inflate to a physiologic pressure.   Wounds were hydrated with balanced salt solution.  The anterior chamber was inflated to a physiologic pressure with balanced salt solution.  No wound leaks were noted. Cefuroxime 0.1 ml of a 10mg /ml solution was injected into the anterior chamber for a dose of 1 mg of intracameral antibiotic at the completion of the case.   Timolol and Brimonidine drops were applied to the eye.  The patient was taken to the recovery room in stable condition without complications of anesthesia or surgery.  Favor Hackler 03/06/2018, 11:27 AM

## 2018-03-06 NOTE — H&P (Signed)
The History and Physical notes are on paper, have been signed, and are to be scanned. The patient remains stable and unchanged from the H&P.   Previous H&P reviewed, patient examined, and there are no changes.  Lynn Mccoy 03/06/2018 10:07 AM

## 2018-03-07 ENCOUNTER — Encounter: Payer: Self-pay | Admitting: Ophthalmology

## 2018-04-08 ENCOUNTER — Ambulatory Visit (INDEPENDENT_AMBULATORY_CARE_PROVIDER_SITE_OTHER): Payer: Medicare Other

## 2018-04-08 VITALS — BP 152/56 | HR 55 | Temp 98.2°F | Ht 67.0 in | Wt 203.6 lb

## 2018-04-08 DIAGNOSIS — Z0000 Encounter for general adult medical examination without abnormal findings: Secondary | ICD-10-CM

## 2018-04-08 DIAGNOSIS — Z23 Encounter for immunization: Secondary | ICD-10-CM | POA: Diagnosis not present

## 2018-04-08 NOTE — Patient Instructions (Addendum)
Lynn Mccoy , Thank you for taking time to come for your Medicare Wellness Visit. I appreciate your ongoing commitment to your health goals. Please review the following plan we discussed and let me know if I can assist you in the future.   Screening recommendations/referrals: Colonoscopy: Up to date Mammogram: Up to date Bone Density: Up to date Recommended yearly ophthalmology/optometry visit for glaucoma screening and checkup Recommended yearly dental visit for hygiene and checkup  Vaccinations: Influenza vaccine: Up to date Pneumococcal vaccine: Up to date Tdap vaccine: Up to date Shingles vaccine: Pt declines today.     Advanced directives: Please bring a copy of your POA (Power of Attorney) and/or Living Will to your next appointment.   Conditions/risks identified: Recommend to walk 3x per week (30 min per time).  Next appointment: 04/17/18 @ 9 AM with Dr Caryn Section. Pt declined scheduling the AWV for 2020.    Preventive Care 64 Years and Older, Female Preventive care refers to lifestyle choices and visits with your health care provider that can promote health and wellness. What does preventive care include?  A yearly physical exam. This is also called an annual well check.  Dental exams once or twice a year.  Routine eye exams. Ask your health care provider how often you should have your eyes checked.  Personal lifestyle choices, including:  Daily care of your teeth and gums.  Regular physical activity.  Eating a healthy diet.  Avoiding tobacco and drug use.  Limiting alcohol use.  Practicing safe sex.  Taking low-dose aspirin every day.  Taking vitamin and mineral supplements as recommended by your health care provider. What happens during an annual well check? The services and screenings done by your health care provider during your annual well check will depend on your age, overall health, lifestyle risk factors, and family history of disease. Counseling  Your  health care provider may ask you questions about your:  Alcohol use.  Tobacco use.  Drug use.  Emotional well-being.  Home and relationship well-being.  Sexual activity.  Eating habits.  History of falls.  Memory and ability to understand (cognition).  Work and work Statistician.  Reproductive health. Screening  You may have the following tests or measurements:  Height, weight, and BMI.  Blood pressure.  Lipid and cholesterol levels. These may be checked every 5 years, or more frequently if you are over 64 years old.  Skin check.  Lung cancer screening. You may have this screening every year starting at age 76 if you have a 30-pack-year history of smoking and currently smoke or have quit within the past 15 years.  Fecal occult blood test (FOBT) of the stool. You may have this test every year starting at age 73.  Flexible sigmoidoscopy or colonoscopy. You may have a sigmoidoscopy every 5 years or a colonoscopy every 10 years starting at age 22.  Hepatitis C blood test.  Hepatitis B blood test.  Sexually transmitted disease (STD) testing.  Diabetes screening. This is done by checking your blood sugar (glucose) after you have not eaten for a while (fasting). You may have this done every 1-3 years.  Bone density scan. This is done to screen for osteoporosis. You may have this done starting at age 43.  Mammogram. This may be done every 1-2 years. Talk to your health care provider about how often you should have regular mammograms. Talk with your health care provider about your test results, treatment options, and if necessary, the need for more tests.  Vaccines  Your health care provider may recommend certain vaccines, such as:  Influenza vaccine. This is recommended every year.  Tetanus, diphtheria, and acellular pertussis (Tdap, Td) vaccine. You may need a Td booster every 10 years.  Zoster vaccine. You may need this after age 77.  Pneumococcal 13-valent  conjugate (PCV13) vaccine. One dose is recommended after age 75.  Pneumococcal polysaccharide (PPSV23) vaccine. One dose is recommended after age 28. Talk to your health care provider about which screenings and vaccines you need and how often you need them. This information is not intended to replace advice given to you by your health care provider. Make sure you discuss any questions you have with your health care provider. Document Released: 07/16/2015 Document Revised: 03/08/2016 Document Reviewed: 04/20/2015 Elsevier Interactive Patient Education  2017 [place] Prevention in the Home Falls can cause injuries. They can happen to people of all ages. There are many things you can do to make your home safe and to help prevent falls. What can I do on the outside of my home?  Regularly fix the edges of walkways and driveways and fix any cracks.  Remove anything that might make you trip as you walk through a door, such as a raised step or threshold.  Trim any bushes or trees on the path to your home.  Use bright outdoor lighting.  Clear any walking paths of anything that might make someone trip, such as rocks or tools.  Regularly check to see if handrails are loose or broken. Make sure that both sides of any steps have handrails.  Any raised decks and porches should have guardrails on the edges.  Have any leaves, snow, or ice cleared regularly.  Use sand or salt on walking paths during winter.  Clean up any spills in your garage right away. This includes oil or grease spills. What can I do in the bathroom?  Use night lights.  Install grab bars by the toilet and in the tub and shower. Do not use towel bars as grab bars.  Use non-skid mats or decals in the tub or shower.  If you need to sit down in the shower, use a plastic, non-slip stool.  Keep the floor dry. Clean up any water that spills on the floor as soon as it happens.  Remove soap buildup in the tub or  shower regularly.  Attach bath mats securely with double-sided non-slip rug tape.  Do not have throw rugs and other things on the floor that can make you trip. What can I do in the bedroom?  Use night lights.  Make sure that you have a light by your bed that is easy to reach.  Do not use any sheets or blankets that are too big for your bed. They should not hang down onto the floor.  Have a firm chair that has side arms. You can use this for support while you get dressed.  Do not have throw rugs and other things on the floor that can make you trip. What can I do in the kitchen?  Clean up any spills right away.  Avoid walking on wet floors.  Keep items that you use a lot in easy-to-reach places.  If you need to reach something above you, use a strong step stool that has a grab bar.  Keep electrical cords out of the way.  Do not use floor polish or wax that makes floors slippery. If you must use wax, use non-skid floor wax.  Do not have throw rugs and other things on the floor that can make you trip. What can I do with my stairs?  Do not leave any items on the stairs.  Make sure that there are handrails on both sides of the stairs and use them. Fix handrails that are broken or loose. Make sure that handrails are as long as the stairways.  Check any carpeting to make sure that it is firmly attached to the stairs. Fix any carpet that is loose or worn.  Avoid having throw rugs at the top or bottom of the stairs. If you do have throw rugs, attach them to the floor with carpet tape.  Make sure that you have a light switch at the top of the stairs and the bottom of the stairs. If you do not have them, ask someone to add them for you. What else can I do to help prevent falls?  Wear shoes that:  Do not have high heels.  Have rubber bottoms.  Are comfortable and fit you well.  Are closed at the toe. Do not wear sandals.  If you use a stepladder:  Make sure that it is fully  opened. Do not climb a closed stepladder.  Make sure that both sides of the stepladder are locked into place.  Ask someone to hold it for you, if possible.  Clearly mark and make sure that you can see:  Any grab bars or handrails.  First and last steps.  Where the edge of each step is.  Use tools that help you move around (mobility aids) if they are needed. These include:  Canes.  Walkers.  Scooters.  Crutches.  Turn on the lights when you go into a dark area. Replace any light bulbs as soon as they burn out.  Set up your furniture so you have a clear path. Avoid moving your furniture around.  If any of your floors are uneven, fix them.  If there are any pets around you, be aware of where they are.  Review your medicines with your doctor. Some medicines can make you feel dizzy. This can increase your chance of falling. Ask your doctor what other things that you can do to help prevent falls. This information is not intended to replace advice given to you by your health care provider. Make sure you discuss any questions you have with your health care provider. Document Released: 04/15/2009 Document Revised: 11/25/2015 Document Reviewed: 07/24/2014 Elsevier Interactive Patient Education  2017 [employer].

## 2018-04-08 NOTE — Progress Notes (Signed)
Subjective:   Lynn Mccoy is a 77 y.o. female who presents for Medicare Annual (Subsequent) preventive examination.  Review of Systems:  N/A  Cardiac Risk Factors include: advanced age (>52men, >2 women);obesity (BMI >30kg/m2);dyslipidemia     Objective:     Vitals: BP (!) 152/56 (BP Location: Right Arm)   Pulse (!) 55   Temp 98.2 F (36.8 C) (Oral)   Ht 5\' 7"  (1.702 m)   Wt 203 lb 9.6 oz (92.4 kg)   BMI 31.89 kg/m   Body mass index is 31.89 kg/m.  Advanced Directives 04/08/2018 03/06/2018 04/04/2017 04/03/2016 12/11/2015 04/29/2015  Does Patient Have a Medical Advance Directive? Yes Yes Yes Yes No Yes  Type of Paramedic of [place];Living will [place];Living will Living will;Healthcare Power of Attorney Living will;Healthcare Power of Attorney - Living will;Healthcare Power of Attorney  Does patient want to make changes to medical advance directive? - No - Patient declined - - - -  Copy of [place] in Chart? No - copy requested No - copy requested No - copy requested - - -  Would patient like information on creating a medical advance directive? - - - - No - patient declined information -    Tobacco Social History   Tobacco Use  Smoking Status Never Smoker  Smokeless Tobacco Never Used     Counseling given: Not Answered   Clinical Intake:  Pre-visit preparation completed: Yes  Pain : No/denies pain Pain Score: 0-No pain     Nutritional Status: BMI > 30  Obese Nutritional Risks: None Diabetes: No  How often do you need to have someone help you when you read instructions, pamphlets, or other written materials from your doctor or pharmacy?: 1 - Never  Interpreter Needed?: No  Information entered by :: [facility], LPN  Past Medical History:  Diagnosis Date  . Cancer ([place])    ovarian ca  . Depression   . History of cervical cancer 12/29/2014   stage 4, s/p complete hysterectomy   . History  of mumps as a child   . Hyperlipidemia   . Osteoarthrosis    knees  . Osteopenia   . Wears dentures    full upper and lower   Past Surgical History:  Procedure Laterality Date  . ABDOMINAL HYSTERECTOMY  1977   due to cervical cancer stage 4  . APPENDECTOMY  1977  . arthritis Bilateral   . CATARACT EXTRACTION W/PHACO Left 03/06/2018   Procedure: CATARACT EXTRACTION PHACO AND INTRAOCULAR LENS PLACEMENT ([place]) COMPLICATED LEFT;  Surgeon: Leandrew Koyanagi, MD;  Location: [facility];  Service: Ophthalmology;  Laterality: Left;  [place]  . REPLACEMENT TOTAL KNEE Right   . ROTATOR CUFF REPAIR Right about 2006   Dr. Sabra Heck   Family History  Problem Relation Age of Onset  . Leukemia Mother   . Lung cancer Father   . Dementia Sister   . Alzheimer's disease Sister   . Heart attack Brother   . Rheum arthritis Sister   . COPD Sister   . Breast cancer Neg Hx    Social History   Socioeconomic History  . Marital status: Widowed    Spouse name: Not on file  . Number of children: 3  . Years of education: 8  . Highest education level: High school graduate  Occupational History  . Occupation: Retired    Comment: former Microbiologist  .  Financial resource strain: Not hard at all  . Food insecurity:    Worry: Never true    Inability: Never true  . Transportation needs:    Medical: No    Non-medical: No  Tobacco Use  . Smoking status: Never Smoker  . Smokeless tobacco: Never Used  Substance and Sexual Activity  . Alcohol use: Yes    Alcohol/week: 0.0 - 1.0 standard drinks  . Drug use: No  . Sexual activity: Not Currently  Lifestyle  . Physical activity:    Days per week: Not on file    Minutes per session: Not on file  . Stress: Only a little  Relationships  . Social connections:    Talks on phone: Not on file    Gets together: Not on file    Attends religious service: Not on file    Active member of club  or organization: Not on file    Attends meetings of clubs or organizations: Not on file    Relationship status: Not on file  Other Topics Concern  . Not on file  Social History Narrative  . Not on file    Outpatient Encounter Medications as of 04/08/2018  Medication Sig  . aspirin 81 MG chewable tablet Chew 1 tablet by mouth daily.  . cholecalciferol (VITAMIN D) 1000 UNITS tablet Take 1 tablet by mouth daily.  . Cyanocobalamin (VITAMIN B 12) 100 MCG LOZG Take 1 tablet by mouth daily.  Marland Kitchen loratadine (CLARITIN) 10 MG tablet Take 1 tablet by mouth daily.   . montelukast (SINGULAIR) 10 MG tablet TAKE ONE TABLET BY MOUTH EVERY NIGHT AT BEDTIME  . naproxen sodium (ANAPROX) 220 MG tablet Take 220 mg by mouth 2 (two) times daily with a meal.  . NONFORMULARY OR COMPOUNDED ITEM SeroVital- hgh 4 tablets daily  . sertraline (ZOLOFT) 100 MG tablet TAKE ONE TABLET BY MOUTH EVERY DAY (Patient taking differently: Take 50 mg by mouth every other day. )  . traZODone (DESYREL) 100 MG tablet TAKE ONE-HALF TO ONE TABLET ONE HOUR BEFORE BEDTIME.  . Brimonidine Tartrate (LUMIFY) 0.025 % SOLN Apply to eye daily.   No facility-administered encounter medications on file as of 04/08/2018.     Activities of Daily Living In your present state of health, do you have any difficulty performing the following activities: 04/08/2018  Hearing? N  Vision? N  Difficulty concentrating or making decisions? N  Walking or climbing stairs? N  Dressing or bathing? N  Doing errands, shopping? N  Preparing Food and eating ? N  Using the Toilet? N  In the past six months, have you accidently leaked urine? N  Do you have problems with loss of bowel control? N  Managing your Medications? N  Managing your Finances? N  Housekeeping or managing your Housekeeping? N  Some recent data might be hidden    Patient Care Team: Birdie Sons, MD as PCP - General (Family Medicine) Leanor Kail, MD (Unknown Physician  Specialty) Emmaline Kluver., MD (Rheumatology)    Assessment:   This is a routine wellness examination for Lynn Mccoy.  Exercise Activities and Dietary recommendations Current Exercise Habits: The patient does not participate in regular exercise at present, Exercise limited by: None identified  Goals    . Exercise 3x per week (30 min per time)     Recommend to walk 3x per week (30 min per time).    . Increase water intake     Recommend drinking 6-8 glasses of water per  day. Due to foul odor with voiding, would also recommend adding Cranberry juice as well.       Fall Risk Fall Risk  04/08/2018 04/04/2017 04/03/2016 03/24/2015  Falls in the past year? No Yes No No  Number falls in past yr: - 1 - -  Injury with Fall? - No - -  Risk for fall due to : - Impaired balance/gait;Impaired vision;Medication side effect - -  Risk for fall due to: Comment - Right knee repair and arthritis, eyeglasses - -  Follow up - Education provided;Falls prevention discussed - -   FALL RISK PREVENTION PERTAINING TO THE HOME:  Any stairs in or around the home WITH handrails? No  Home free of loose throw rugs in walkways, pet beds, electrical cords, etc? Yes  Adequate lighting in your home to reduce risk of falls? Yes   ASSISTIVE DEVICES UTILIZED TO PREVENT FALLS:  Life alert? Yes  Use of a cane, walker or w/c? No  Grab bars in the bathroom? No  Shower chair or bench in shower? No  Elevated toilet seat or a handicapped toilet? Yes   DME ORDERS:  DME order needed?  No   TIMED UP AND GO:  Was the test performed? No .   Depression Screen PHQ 2/9 Scores 04/08/2018 04/04/2017 04/03/2016 03/24/2015  PHQ - 2 Score 1 1 3  0  PHQ- 9 Score - - 7 3     Cognitive Function: Pt declined screening today.      6CIT Screen 04/04/2017  What Year? 0 points  What month? 0 points  What time? 0 points  Count back from 20 0 points  Months in reverse 0 points  Repeat phrase 8 points  Total Score 8     Immunization History  Administered Date(s) Administered  . Influenza, High Dose Seasonal PF 04/11/2017, 04/08/2018  . Pneumococcal Conjugate-13 03/12/2014  . Pneumococcal Polysaccharide-23 03/22/2005  . Td 01/19/1997, 11/23/2008  . Tdap 12/11/2015  . Zoster 10/28/2007    Qualifies for Shingles Vaccine?Yes . Due for Shingrix. Education has been provided regarding the importance of this vaccine. Pt has been advised to call insurance company to determine out of pocket expense. Advised may also receive vaccine at local pharmacy or Health Dept. Verbalized acceptance and understanding.  Tdap: Up to date  Flu Vaccine: Due for Flu vaccine. Does the patient want to receive this vaccine today?  Yes .   Pneumococcal Vaccine: Up to date   Screening Tests Health Maintenance  Topic Date Due  . TETANUS/TDAP  12/10/2025  . INFLUENZA VACCINE  Completed  . DEXA SCAN  Completed  . PNA vac Low Risk Adult  Completed    Cancer Screenings:  Colorectal Screening: Completed 04/06/14. Repeat every 20 years.  Mammogram: Completed 05/09/17. Repeat every year; No longer required.   Bone Density: Completed 05/22/17.   Lung Cancer Screening: (Low Dose CT Chest recommended if Age 40-80 years, 30 pack-year currently smoking OR have quit w/in 15years.) does not qualify.   Additional Screening:  Hepatitis C Screening: does not qualify.  Vision Screening: Recommended annual ophthalmology exams for early detection of glaucoma and other disorders of the eye.  Dental Screening: Recommended annual dental exams for proper oral hygiene  Community Resource Referral:  CRR required this visit?  No       Plan:  I have personally reviewed and addressed the Medicare Annual Wellness questionnaire and have noted the following in the patient's chart:  A. Medical and social history B. Use of  alcohol, tobacco or illicit drugs  C. Current medications and supplements D. Functional ability and status E.   Nutritional status F.  Physical activity G. Advance directives H. List of other physicians I.  Hospitalizations, surgeries, and ER visits in previous 12 months J.  [place] such as hearing and vision if needed, cognitive and depression L. Referrals and appointments - none  In addition, I have reviewed and discussed with patient certain preventive protocols, quality metrics, and best practice recommendations. A written personalized care plan for preventive services as well as general preventive health recommendations were provided to patient.  See attached scanned questionnaire for additional information.   Signed,  Fabio Neighbors, LPN Nurse Health Advisor   Nurse Recommendations: Both BP readings were elevated today. Pt declined any abnormal s/s. Advised pt to check BP 1-2xs a day, record readings and bring into next apt on 04/17/18 to review with PCP. Pt stated understanding.

## 2018-04-17 ENCOUNTER — Encounter: Payer: Self-pay | Admitting: Family Medicine

## 2018-04-17 ENCOUNTER — Ambulatory Visit (INDEPENDENT_AMBULATORY_CARE_PROVIDER_SITE_OTHER): Payer: Medicare Other | Admitting: Family Medicine

## 2018-04-17 VITALS — BP 158/68 | HR 56 | Temp 98.3°F | Resp 16 | Wt 203.0 lb

## 2018-04-17 DIAGNOSIS — F329 Major depressive disorder, single episode, unspecified: Secondary | ICD-10-CM

## 2018-04-17 DIAGNOSIS — Z6831 Body mass index (BMI) 31.0-31.9, adult: Secondary | ICD-10-CM

## 2018-04-17 DIAGNOSIS — I10 Essential (primary) hypertension: Secondary | ICD-10-CM | POA: Diagnosis not present

## 2018-04-17 DIAGNOSIS — E669 Obesity, unspecified: Secondary | ICD-10-CM | POA: Diagnosis not present

## 2018-04-17 DIAGNOSIS — F32A Depression, unspecified: Secondary | ICD-10-CM

## 2018-04-17 DIAGNOSIS — M1990 Unspecified osteoarthritis, unspecified site: Secondary | ICD-10-CM

## 2018-04-17 DIAGNOSIS — Z0000 Encounter for general adult medical examination without abnormal findings: Secondary | ICD-10-CM

## 2018-04-17 DIAGNOSIS — G4700 Insomnia, unspecified: Secondary | ICD-10-CM

## 2018-04-17 MED ORDER — HYDROCHLOROTHIAZIDE 25 MG PO TABS: 25.0000 mg | ORAL_TABLET | Freq: Every day | ORAL | 3 refills | Status: DC

## 2018-04-17 NOTE — Progress Notes (Signed)
Patient: Lynn Mccoy, Female    DOB: 1941/03/26, 77 y.o.   MRN: [id number] Visit Date: 04/17/2018  Today's Provider: [name], MD   Chief Complaint  Patient presents with  . Annual Exam   Subjective:     Complete Physical Lynn Mccoy is a 77 y.o. female. She feels well. She reports exercising regulary. She reports she is sleeping not well, especially the last two nights. She has been a little depressed since her sister passed away a few months ago. However she feels current dose of sertraline is working well and wishes to continue unchanged.   BP Readings from Last 5 Encounters:  04/17/18 (!) 158/68  04/08/18 (!) 152/56  03/06/18 135/60  04/11/17 (!) 148/70  04/04/17 (!) 142/76   Depression screen PHQ 2/9 04/08/2018 04/04/2017 04/03/2016  Decreased Interest 0 1 1  Down, Depressed, Hopeless 1 0 2  PHQ - 2 Score 1 1 3   Altered sleeping - - 1  Tired, decreased energy - - 1  Change in appetite - - 0  Feeling bad or failure about yourself  - - 1  Trouble concentrating - - 0  Moving slowly or fidgety/restless - - 1  Suicidal thoughts - - 0  PHQ-9 Score - - 7  Difficult doing work/chores - - Not difficult at all    -----------------------------------------------------------   Review of Systems  Constitutional: Negative.   HENT: Negative.   Eyes: Positive for photophobia. Negative for pain, discharge, redness, itching and visual disturbance.  Respiratory: Negative.   Cardiovascular: Positive for leg swelling. Negative for chest pain and palpitations.  Gastrointestinal: Negative.   Endocrine: Negative.   Genitourinary: Negative.   Musculoskeletal: Positive for arthralgias. Negative for back pain, gait problem, joint swelling, myalgias, neck pain and neck stiffness.  Skin: Negative.   Allergic/Immunologic: Negative.   Neurological: Positive for numbness. Negative for dizziness, tremors, seizures, syncope, facial asymmetry, speech difficulty, weakness,  light-headedness and headaches.  Hematological: Negative.   Psychiatric/Behavioral: Positive for sleep disturbance. Negative for agitation, behavioral problems, confusion, decreased concentration, dysphoric mood, hallucinations, self-injury and suicidal ideas. The patient is not nervous/anxious and is not hyperactive.     Social History   Socioeconomic History  . Marital status: Widowed    Spouse name: Not on file  . Number of children: 3  . Years of education: 66  . Highest education level: High school graduate  Occupational History  . Occupation: Retired    Comment: former Microbiologist  . Financial resource strain: Not hard at all  . Food insecurity:    Worry: Never true    Inability: Never true  . Transportation needs:    Medical: No    Non-medical: No  Tobacco Use  . Smoking status: Never Smoker  . Smokeless tobacco: Never Used  Substance and Sexual Activity  . Alcohol use: Yes    Alcohol/week: 0.0 - 1.0 standard drinks  . Drug use: No  . Sexual activity: Not Currently  Lifestyle  . Physical activity:    Days per week: Not on file    Minutes per session: Not on file  . Stress: Only a little  Relationships  . Social connections:    Talks on phone: Not on file    Gets together: Not on file    Attends religious service: Not on file    Active member of club or organization: Not on file    Attends meetings of clubs or organizations: Not on  file    Relationship status: Not on file  . Intimate partner violence:    Fear of current or ex partner: Not on file    Emotionally abused: Not on file    Physically abused: Not on file    Forced sexual activity: Not on file  Other Topics Concern  . Not on file  Social History Narrative  . Not on file    Past Medical History:  Diagnosis Date  . Cancer ([place])    ovarian ca  . Depression   . History of cervical cancer 12/29/2014   stage 4, s/p complete hysterectomy   . History of mumps as a child   .  Hyperlipidemia   . Osteoarthrosis    knees  . Osteopenia   . Wears dentures    full upper and lower     Patient Active Problem List   Diagnosis Date Noted  . Allergic rhinitis 01/22/2015  . Depression 12/29/2014  . Fatigue 12/29/2014  . Arthritis, degenerative 11/06/2013  . History of repair of rotator cuff 11/06/2013  . Insomnia 12/06/2009  . Adjustment disorder with depressed mood 01/13/2009  . Osteopenia 11/24/2008  . Diverticulosis of colon without hemorrhage 10/29/2008  . Obesity 10/29/2008  . Osteoarthrosis 10/29/2008    Past Surgical History:  Procedure Laterality Date  . ABDOMINAL HYSTERECTOMY  1977   due to cervical cancer stage 4  . APPENDECTOMY  1977  . arthritis Bilateral   . CATARACT EXTRACTION W/PHACO Left 03/06/2018   Procedure: CATARACT EXTRACTION PHACO AND INTRAOCULAR LENS PLACEMENT ([place]) COMPLICATED LEFT;  Surgeon: Leandrew Koyanagi, MD;  Location: [facility];  Service: Ophthalmology;  Laterality: Left;  [place]  . REPLACEMENT TOTAL KNEE Right   . ROTATOR CUFF REPAIR Right about 2006   Dr. Sabra Heck    Her family history includes Alzheimer's disease in her sister; COPD in her sister; Dementia in her sister; Heart attack in her brother; Leukemia in her mother; Lung cancer in her father; Rheum arthritis in her sister. There is no history of Breast cancer.      Current Outpatient Medications:  .  aspirin 81 MG chewable tablet, Chew 1 tablet by mouth daily., Disp: , Rfl:  .  Brimonidine Tartrate (LUMIFY) 0.025 % SOLN, Apply to eye daily., Disp: , Rfl:  .  cholecalciferol (VITAMIN D) 1000 UNITS tablet, Take 1 tablet by mouth daily., Disp: , Rfl:  .  Cyanocobalamin (VITAMIN B 12) 100 MCG LOZG, Take 1 tablet by mouth daily., Disp: , Rfl:  .  loratadine (CLARITIN) 10 MG tablet, Take 1 tablet by mouth daily. , Disp: , Rfl:  .  montelukast (SINGULAIR) 10 MG tablet, TAKE ONE TABLET BY MOUTH EVERY NIGHT AT BEDTIME,  Disp: 30 tablet, Rfl: 12 .  naproxen sodium (ANAPROX) 220 MG tablet, Take 220 mg by mouth 2 (two) times daily with a meal., Disp: , Rfl:  .  NONFORMULARY OR COMPOUNDED ITEM, SeroVital- hgh 4 tablets daily, Disp: , Rfl:  .  sertraline (ZOLOFT) 100 MG tablet, TAKE ONE TABLET BY MOUTH EVERY DAY (Patient taking differently: Take 50 mg by mouth daily. ), Disp: 30 tablet, Rfl: 11 .  traZODone (DESYREL) 100 MG tablet, TAKE ONE-HALF TO ONE TABLET ONE HOUR BEFORE BEDTIME., Disp: 30 tablet, Rfl: 11  Patient Care Team: Birdie Sons, MD as PCP - General (Family Medicine) Leanor Kail, MD (Unknown Physician Specialty) Emmaline Kluver., MD (Rheumatology)     Objective:   Vitals:  BP (!) 158/68 (BP Location: Right Arm, Patient Position: Sitting, Cuff Size: Normal)   Pulse (!) 56   Temp 98.3 F (36.8 C) (Oral)   Resp 16   Wt 203 lb (92.1 kg)   BMI 31.79 kg/m   Physical Exam   General Appearance:    Alert, cooperative, no distress, appears stated age  Head:    Normocephalic, without obvious abnormality, atraumatic  Eyes:    PERRL, conjunctiva/corneas clear, EOM's intact, fundi    benign, both eyes  Ears:    Normal TM's and external ear canals, both ears  Nose:   Nares normal, septum midline, mucosa normal, no drainage    or sinus tenderness  Throat:   Lips, mucosa, and tongue normal; teeth and gums normal  Neck:   Supple, symmetrical, trachea midline, no adenopathy;    thyroid:  no enlargement/tenderness/nodules; no carotid   bruit or JVD  Back:     Symmetric, no curvature, ROM normal, no CVA tenderness  Lungs:     Clear to auscultation bilaterally, respirations unlabored  Chest Wall:    No tenderness or deformity   Heart:    Regular rate and rhythm, S1 and S2 normal, no murmur, rub   or gallop  Breast Exam:    normal appearance, no masses or tenderness  Abdomen:     Soft, non-tender, bowel sounds active all four quadrants,    no masses, no organomegaly  Pelvic:    deferred    Extremities:   Extremities normal, atraumatic, no cyanosis or edema  Pulses:   2+ and symmetric all extremities  Skin:   Skin color, texture, turgor normal, no rashes or lesions  Lymph nodes:   Cervical, supraclavicular, and axillary nodes normal  Neurologic:   CNII-XII intact, normal strength, sensation and reflexes    throughout    Activities of Daily Living In your present state of health, do you have any difficulty performing the following activities: 04/08/2018  Hearing? N  Vision? N  Difficulty concentrating or making decisions? N  Walking or climbing stairs? N  Dressing or bathing? N  Doing errands, shopping? N  Preparing Food and eating ? N  Using the Toilet? N  In the past six months, have you accidently leaked urine? N  Do you have problems with loss of bowel control? N  Managing your Medications? N  Managing your Finances? N  Housekeeping or managing your Housekeeping? N  Some recent data might be hidden    Fall Risk Assessment Fall Risk  04/08/2018 04/04/2017 04/03/2016 03/24/2015  Falls in the past year? No Yes No No  Number falls in past yr: - 1 - -  Injury with Fall? - No - -  Risk for fall due to : - Impaired balance/gait;Impaired vision;Medication side effect - -  Risk for fall due to: Comment - Right knee repair and arthritis, eyeglasses - -  Follow up - Education provided;Falls prevention discussed - -     Depression Screen PHQ 2/9 Scores 04/08/2018 04/04/2017 04/03/2016 03/24/2015  PHQ - 2 Score 1 1 3  0  PHQ- 9 Score - - 7 3        Assessment & Plan:    Annual Physical Reviewed patient's Family Medical History Reviewed and updated list of patient's medical providers Assessment of cognitive impairment was done Assessed patient's functional ability Established a written schedule for health screening [facility] Completed and Reviewed  Exercise Activities and Dietary recommendations Goals    . Exercise 3x  per week (30 min per  time)     Recommend to walk 3x per week (30 min per time).    . Increase water intake     Recommend drinking 6-8 glasses of water per day. Due to foul odor with voiding, would also recommend adding Cranberry juice as well.       Immunization History  Administered Date(s) Administered  . Influenza, High Dose Seasonal PF 04/11/2017, 04/08/2018  . Pneumococcal Conjugate-13 03/12/2014  . Pneumococcal Polysaccharide-23 03/22/2005  . Td 01/19/1997, 11/23/2008  . Tdap 12/11/2015  . Zoster 10/28/2007    Health Maintenance  Topic Date Due  . Samul Dada  12/10/2025  . INFLUENZA VACCINE  Completed  . DEXA SCAN  Completed  . PNA vac Low Risk Adult  Completed     Discussed health benefits of physical activity, and encouraged her to engage in regular exercise appropriate for her age and condition.    ------------------------------------------------------------------------------------------------------------  1. Annual physical exam Recommended Shingrix vaccine. Generally doing well.   2. Depression, unspecified depression type Continue 50mg  sertraline daily.   3. Class 1 obesity without serious comorbidity with body mass index (BMI) of 31.0 to 31.9 in adult, unspecified obesity type Diet and exercise.   4. . Essential hypertension Start 25mg  hctz and follow up in 6 weeks.  - Lipid panel - Renal function panel  6. Insomnia, unspecified type Continue trazodone.    [name], MD  [facility]

## 2018-04-17 NOTE — Patient Instructions (Addendum)
   The CDC recommends two doses of Shingrix (the shingles vaccine) separated by 2 to 6 months for adults age 78 years and older. I recommend checking with your pharmacy plan regarding coverage for this vaccine.     

## 2018-04-18 ENCOUNTER — Other Ambulatory Visit: Payer: Self-pay | Admitting: Family Medicine

## 2018-04-18 DIAGNOSIS — Z1231 Encounter for screening mammogram for malignant neoplasm of breast: Secondary | ICD-10-CM

## 2018-04-18 LAB — RENAL FUNCTION PANEL
ALBUMIN: 4.2 g/dL (ref 3.5–4.8)
BUN/Creatinine Ratio: 27 (ref 12–28)
BUN: 21 mg/dL (ref 8–27)
CALCIUM: 9.1 mg/dL (ref 8.7–10.3)
CO2: 23 mmol/L (ref 20–29)
CREATININE: 0.78 mg/dL (ref 0.57–1.00)
Chloride: 104 mmol/L (ref 96–106)
GFR calc Af Amer: 84 mL/min/{1.73_m2} (ref >59)
GFR calc non Af Amer: 73 mL/min/{1.73_m2} (ref >59)
Glucose: 96 mg/dL (ref 65–99)
PHOSPHORUS: 3.2 mg/dL (ref 2.5–4.5)
Potassium: 4.4 mmol/L (ref 3.5–5.2)
SODIUM: 143 mmol/L (ref 134–144)

## 2018-04-18 LAB — LIPID PANEL
CHOL/HDL RATIO: 2.8 ratio (ref 0.0–4.4)
CHOLESTEROL TOTAL: 150 mg/dL (ref 100–199)
HDL: 53 mg/dL (ref >39)
LDL Calculated: 85 mg/dL (ref 0–99)
TRIGLYCERIDES: 58 mg/dL (ref 0–149)
VLDL Cholesterol Cal: 12 mg/dL (ref 5–40)

## 2018-05-10 ENCOUNTER — Ambulatory Visit
Admission: RE | Admit: 2018-05-10 | Discharge: 2018-05-10 | Disposition: A | Discharge status: Home / Self Care | Payer: Medicare Other | Source: Ambulatory Visit | Attending: Family Medicine | Admitting: Family Medicine

## 2018-05-10 DIAGNOSIS — Z1231 Encounter for screening mammogram for malignant neoplasm of breast: Secondary | ICD-10-CM | POA: Diagnosis not present

## 2018-05-22 ENCOUNTER — Ambulatory Visit (INDEPENDENT_AMBULATORY_CARE_PROVIDER_SITE_OTHER): Payer: Medicare Other | Admitting: Family Medicine

## 2018-05-22 ENCOUNTER — Encounter: Payer: Self-pay | Admitting: Family Medicine

## 2018-05-22 VITALS — BP 128/74 | HR 68 | Temp 98.2°F | Wt 196.6 lb

## 2018-05-22 DIAGNOSIS — I10 Essential (primary) hypertension: Secondary | ICD-10-CM | POA: Diagnosis not present

## 2018-05-22 NOTE — Progress Notes (Signed)
Patient: Lynn Mccoy Female    DOB: [birth year]   77 y.o.   MRN: [id number] Visit Date: 05/22/2018  Today's Provider: [name], MD   Chief Complaint  Patient presents with  . Hypertension   Subjective:    HPI  Hypertension, follow-up:  BP Readings from Last 3 Encounters:  05/22/18 128/74  04/17/18 (!) 158/68  04/08/18 (!) 152/56    She was last seen for hypertension 6 weeks ago.  BP at that visit was 128/74. Management changes since that visit include started HCTZ 25 mg QD. She reports good compliance with treatment. She is not having side effects.  She is exercising. She is not adherent to low salt diet.   Outside blood pressures are n/a. She is experiencing none.  Patient denies chest pain, chest pressure/discomfort, dyspnea, exertional chest pressure/discomfort, irregular heart beat, lower extremity edema, palpitations and tachypnea.   Cardiovascular risk factors include hypertension.  Use of agents associated with hypertension: none.     Weight trend: decreasing steadily Wt Readings from Last 3 Encounters:  05/22/18 196 lb 9.6 oz (89.2 kg)  04/17/18 203 lb (92.1 kg)  04/08/18 203 lb 9.6 oz (92.4 kg)    Current diet: well balanced  ------------------------------------------------------------------------     Allergies  Allergen Reactions  . Peanut-Containing Drug Products Swelling    (peanuts only - caused mouth swelling)     Current Outpatient Medications:  .  aspirin 81 MG chewable tablet, Chew 1 tablet by mouth daily., Disp: , Rfl:  .  cholecalciferol (VITAMIN D) 1000 UNITS tablet, Take 1 tablet by mouth daily., Disp: , Rfl:  .  Cyanocobalamin (VITAMIN B 12) 100 MCG LOZG, Take 1 tablet by mouth daily., Disp: , Rfl:  .  hydrochlorothiazide (HYDRODIURIL) 25 MG tablet, Take 1 tablet (25 mg total) by mouth daily., Disp: 90 tablet, Rfl: 3 .  loratadine (CLARITIN) 10 MG tablet, Take 1 tablet by mouth daily. , Disp: , Rfl:  .  montelukast  (SINGULAIR) 10 MG tablet, TAKE ONE TABLET BY MOUTH EVERY NIGHT AT BEDTIME, Disp: 30 tablet, Rfl: 12 .  naproxen sodium (ANAPROX) 220 MG tablet, Take 220 mg by mouth 2 (two) times daily with a meal., Disp: , Rfl:  .  NONFORMULARY OR COMPOUNDED ITEM, SeroVital- hgh 4 tablets daily, Disp: , Rfl:  .  sertraline (ZOLOFT) 100 MG tablet, TAKE ONE TABLET BY MOUTH EVERY DAY (Patient taking differently: Take 50 mg by mouth daily. ), Disp: 30 tablet, Rfl: 11 .  traZODone (DESYREL) 100 MG tablet, TAKE ONE-HALF TO ONE TABLET ONE HOUR BEFORE BEDTIME., Disp: 30 tablet, Rfl: 11 .  Brimonidine Tartrate (LUMIFY) 0.025 % SOLN, Apply to eye daily., Disp: , Rfl:   Review of Systems  Constitutional: Negative.   HENT: Negative.   Gastrointestinal: Negative.   Genitourinary: Negative.     Social History   Tobacco Use  . Smoking status: Never Smoker  . Smokeless tobacco: Never Used  Substance Use Topics  . Alcohol use: Yes    Alcohol/week: 0.0 - 1.0 standard drinks   Objective:   BP 128/74 (BP Location: Right Arm, Patient Position: Sitting, Cuff Size: Normal)   Pulse 68   Temp 98.2 F (36.8 C) (Oral)   Wt 196 lb 9.6 oz (89.2 kg)   SpO2 95%   BMI 30.79 kg/m  Vitals:   05/22/18 0839  BP: 128/74  Pulse: 68  Temp: 98.2 F (36.8 C)  TempSrc: Oral  SpO2: 95%  Weight: 196  lb 9.6 oz (89.2 kg)     Physical Exam   General Appearance:    Alert, cooperative, no distress  Eyes:    PERRL, conjunctiva/corneas clear, EOM's intact       Lungs:     Clear to auscultation bilaterally, respirations unlabored  Heart:    Regular rate and rhythm  Neurologic:   Awake, alert, oriented x 3. No apparent focal neurological           defect.          Assessment & Plan:     1. Essential hypertension Much better with addition of hctz. Check lytes. If normal then Continue current medications and follow up annual exam October 2020 - Renal function panel - Magnesium       [name], MD  [facility]

## 2018-05-23 LAB — RENAL FUNCTION PANEL
ALBUMIN: 4.3 g/dL (ref 3.5–4.8)
BUN/Creatinine Ratio: 23 (ref 12–28)
BUN: 18 mg/dL (ref 8–27)
CO2: 26 mmol/L (ref 20–29)
Calcium: 9.6 mg/dL (ref 8.7–10.3)
Chloride: 100 mmol/L (ref 96–106)
Creatinine, Ser: 0.78 mg/dL (ref 0.57–1.00)
GFR calc Af Amer: 84 mL/min/{1.73_m2} (ref >59)
GFR, EST NON AFRICAN AMERICAN: 73 mL/min/{1.73_m2} (ref >59)
Glucose: 98 mg/dL (ref 65–99)
PHOSPHORUS: 3.2 mg/dL (ref 2.5–4.5)
Potassium: 3.6 mmol/L (ref 3.5–5.2)
SODIUM: 143 mmol/L (ref 134–144)

## 2018-05-23 LAB — MAGNESIUM: Magnesium: 2.2 mg/dL (ref 1.6–2.3)

## 2018-06-20 DIAGNOSIS — Z96651 Presence of right artificial knee joint: Secondary | ICD-10-CM | POA: Diagnosis not present

## 2018-06-20 DIAGNOSIS — M1711 Unilateral primary osteoarthritis, right knee: Secondary | ICD-10-CM | POA: Diagnosis not present

## 2018-07-10 ENCOUNTER — Other Ambulatory Visit: Payer: Self-pay | Admitting: Family Medicine

## 2018-08-13 DIAGNOSIS — M65331 Trigger finger, right middle finger: Secondary | ICD-10-CM | POA: Diagnosis not present

## 2018-09-05 ENCOUNTER — Other Ambulatory Visit: Payer: Self-pay | Admitting: Family Medicine

## 2018-11-25 IMAGING — MR MR KNEE*R* W/O CM
7 series · 40 of 40 positions shown · non-contrast
Comparison: MRI right knee 05/21/2007.

CLINICAL DATA: Worsening anterior right knee pain over the past 2
years. No known injury.

EXAM:
MRI OF THE RIGHT KNEE WITHOUT CONTRAST
TECHNIQUE: Multiplanar, multisequence MR imaging of the knee was performed. No
intravenous contrast was administered.

[Series 3: PD fat-sat · axial · 3.0mm · 0.62mm/px · z∈[-31,+82]mm · 7 of 35 slices shown (1 of 4)]
[im 1/35]
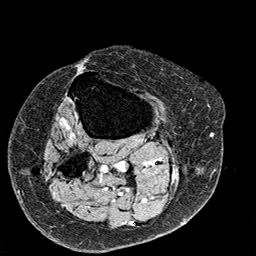
[im 6/35]
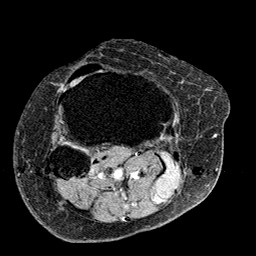
[im 12/35]
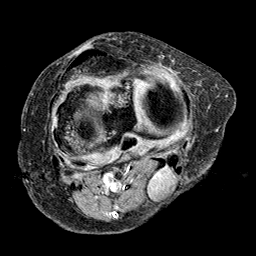
[im 18/35]
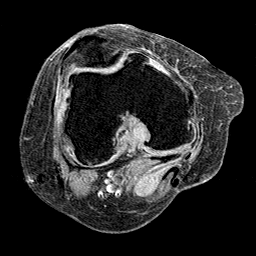
[im 23/35]
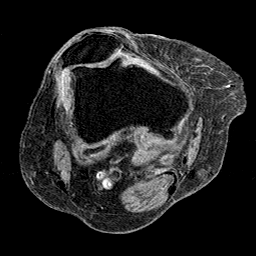
[im 29/35]
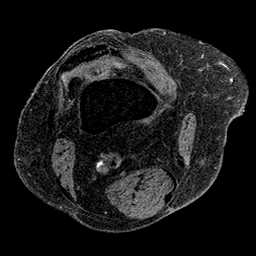
[im 35/35]
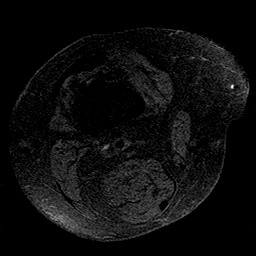

[Series 4: T1 · coronal · 3.0mm · 0.70mm/px · 6 of 31 slices shown]
[im 1/31]
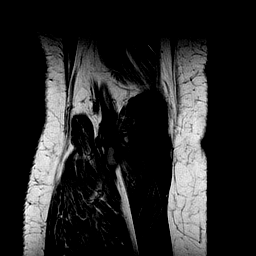
[im 7/31]
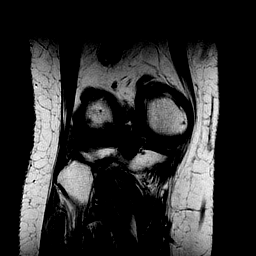
[im 13/31]
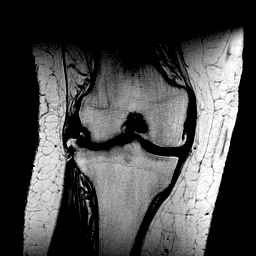
[im 19/31]
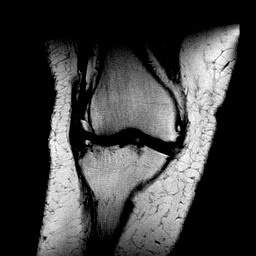
[im 25/31]
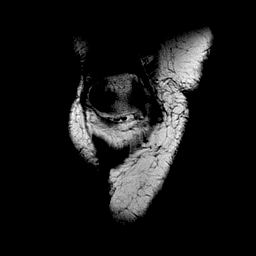
[im 31/31]
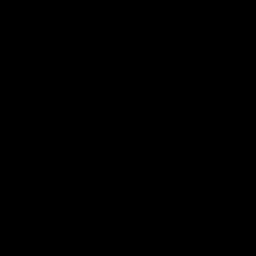

[Series 5: T2 fat-sat · coronal · 3.0mm · 0.70mm/px · 5 of 31 slices shown]
[im 1/31]
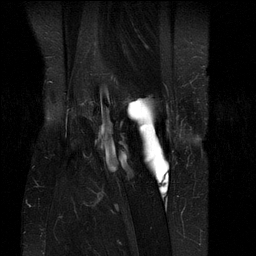
[im 8/31]
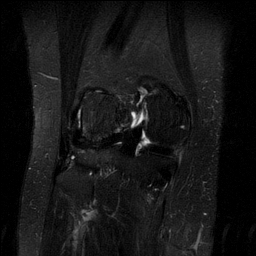
[im 16/31]
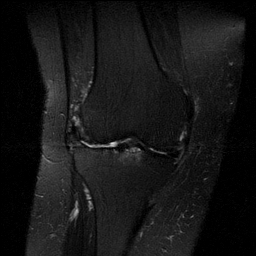
[im 23/31]
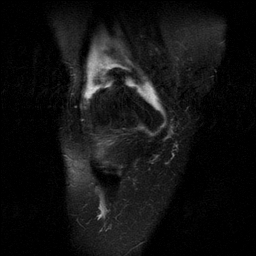
[im 31/31]
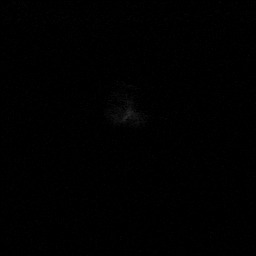

[Series 6: PD fat-sat · sagittal · 3.0mm · 0.62mm/px · 7 of 41 slices shown (2 of 4)]
[im 1/41]
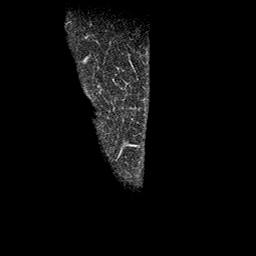
[im 7/41]
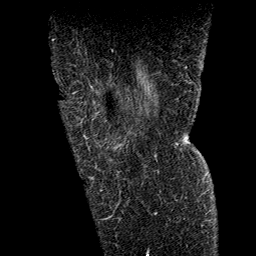
[im 14/41]
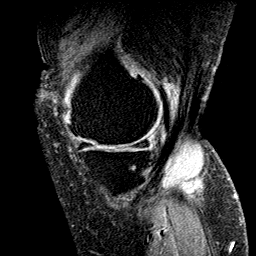
[im 21/41]
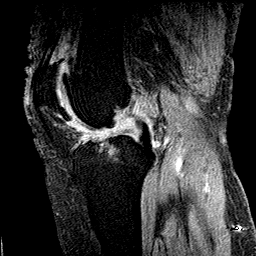
[im 27/41]
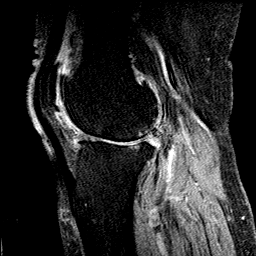
[im 34/41]
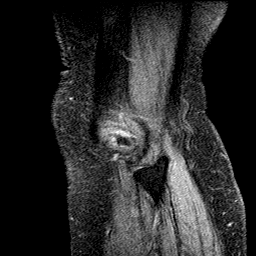
[im 41/41]
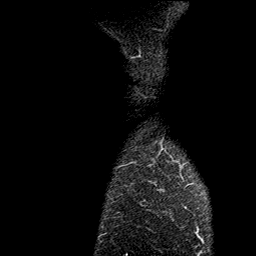

[Series 7: PD fat-sat · coronal · 3.0mm · 0.70mm/px · 5 of 31 slices shown (3 of 4)]
[im 1/31]
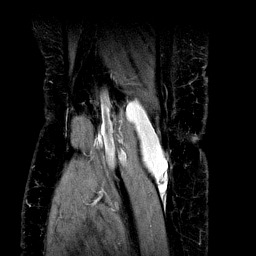
[im 8/31]
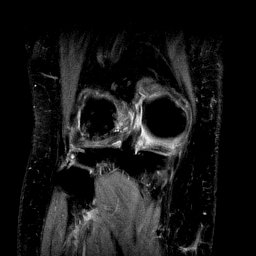
[im 16/31]
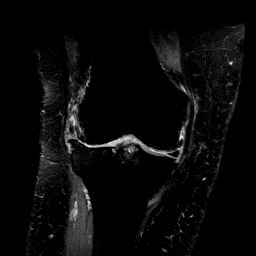
[im 23/31]
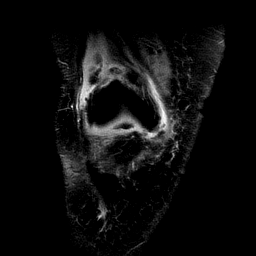
[im 31/31]
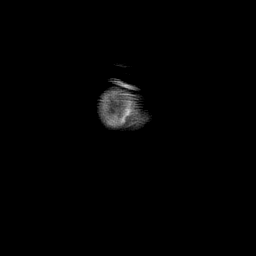

[Series 8: PD fat-sat · coronal · 2.0mm · 0.31mm/px · 3 of 19 slices shown (4 of 4)]
[im 1/19]
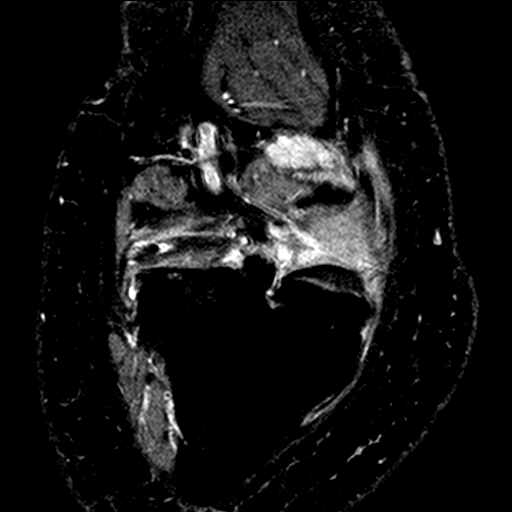
[im 10/19]
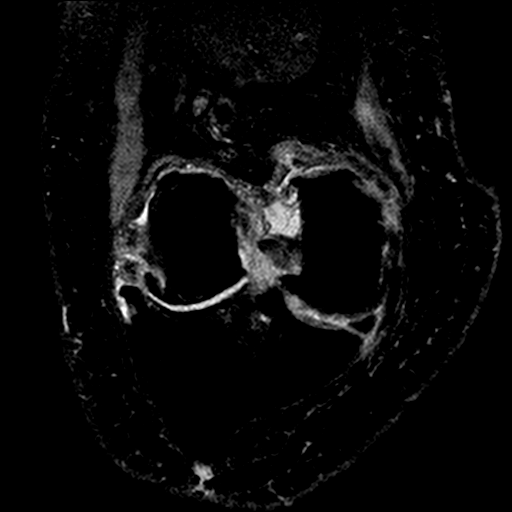
[im 19/19]
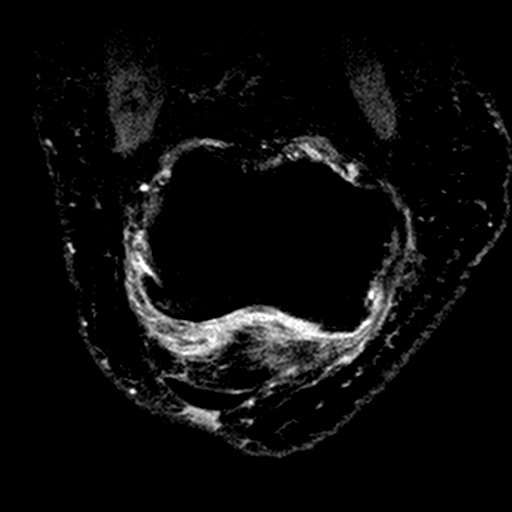

[Series 9: STIR · sagittal · 3.0mm · 0.62mm/px · 7 of 41 slices shown]
[im 1/41]
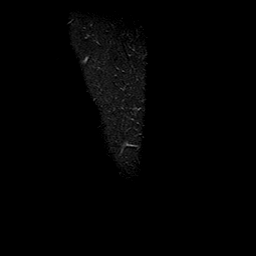
[im 7/41]
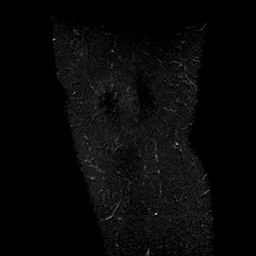
[im 14/41]
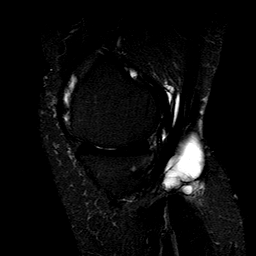
[im 21/41]
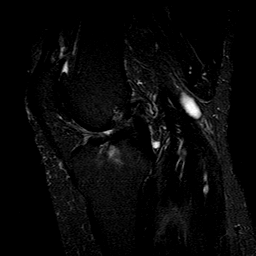
[im 27/41]
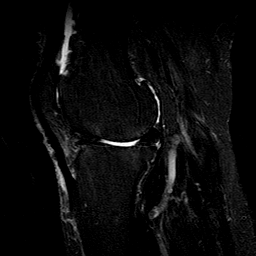
[im 34/41]
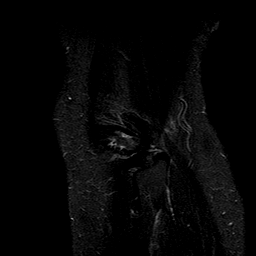
[im 41/41]
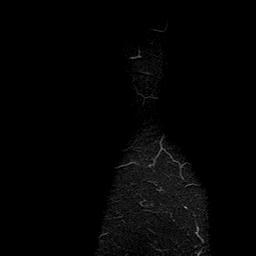

[40 of 40 positions shown; findings below may reference images not displayed]

FINDINGS: MENISCI

Medial meniscus:  Intact.

Lateral meniscus: The anterior and posterior horns are not
visualized consistent with degenerative maceration. The body is
extremely diminutive and extruded peripherally out of the joint with
a large horizontal tear throughout.

LIGAMENTS

Cruciates: Severe appearing mucoid degeneration of the ACL without
tear is identified. The PCL appears normal.

Collaterals:  Intact.

CARTILAGE

Patellofemoral:  Extensive cartilage loss is present throughout.

Medial:  Markedly degenerated.

Lateral:  Appears completely denuded.

Joint:  Very small effusion.

Popliteal Fossa: Baker's cyst measures 1.2 cm AP x 3.0 cm transverse
x 7.3 cm craniocaudal.

Extensor Mechanism:  Intact.

Bones: No fracture or worrisome lesion. Bulky tricompartmental
osteophytosis is worst laterally.

Other: None.
IMPRESSION: Dominant finding is advanced tricompartmental osteoarthritis
appearing worst laterally. Associated degenerative maceration of the
anterior and posterior horns of the lateral meniscus is identified.
The body is extruded peripherally out of the joint and severely
degenerated.

Marked mucoid degeneration of the ACL without tear.

Baker's cyst.

## 2019-04-09 NOTE — Progress Notes (Signed)
Subjective:   Lynn Mccoy is a 78 y.o. female who presents for Medicare Annual (Subsequent) preventive examination.    This visit is being conducted through telemedicine due to the COVID-19 pandemic. This patient has given me verbal consent via doximity to conduct this visit, patient states they are participating from their home address. Some vital signs may be absent or patient reported.    Patient identification: identified by name, DOB, and current address  Review of Systems:  N/A  Cardiac Risk Factors include: advanced age (>23men, >75 women);dyslipidemia;obesity (BMI >30kg/m2)     Objective:     Vitals: There were no vitals taken for this visit.  There is no height or weight on file to calculate BMI. Unable to obtain vitals due to visit being conducted via telephonically.   Advanced Directives 04/10/2019 04/08/2018 03/06/2018 04/04/2017 04/03/2016 12/11/2015 04/29/2015  Does Patient Have a Medical Advance Directive? Yes Yes Yes Yes Yes No Yes  Type of Paramedic of [place];Living will [place];Living will [place];Living will Living will;Healthcare Power of Attorney Living will;Healthcare Power of Attorney - Living will;Healthcare Power of Attorney  Does patient want to make changes to medical advance directive? - - No - Patient declined - - - -  Copy of [place] in Chart? No - copy requested No - copy requested No - copy requested No - copy requested - - -  Would patient like information on creating a medical advance directive? - - - - - No - patient declined information -    Tobacco Social History   Tobacco Use  Smoking Status Never Smoker  Smokeless Tobacco Never Used     Counseling given: Not Answered   Clinical Intake:  Pre-visit preparation completed: Yes  Pain : No/denies pain Pain Score: 0-No pain     Nutritional Status: BMI > 30  Obese Nutritional Risks: None Diabetes:  No  How often do you need to have someone help you when you read instructions, pamphlets, or other written materials from your doctor or pharmacy?: 1 - Never  Interpreter Needed?: No  Information entered by :: [facility], LPN  Past Medical History:  Diagnosis Date  . Cancer ([place])    ovarian ca  . Depression   . History of cervical cancer 12/29/2014   stage 4, s/p complete hysterectomy   . History of mumps as a child   . Hyperlipidemia   . Osteoarthrosis    knees  . Osteopenia   . Wears dentures    full upper and lower   Past Surgical History:  Procedure Laterality Date  . ABDOMINAL HYSTERECTOMY  1977   due to cervical cancer stage 4  . APPENDECTOMY  1977  . arthritis Bilateral   . CATARACT EXTRACTION W/PHACO Left 03/06/2018   Procedure: CATARACT EXTRACTION PHACO AND INTRAOCULAR LENS PLACEMENT ([place]) COMPLICATED LEFT;  Surgeon: Leandrew Koyanagi, MD;  Location: [facility];  Service: Ophthalmology;  Laterality: Left;  HEALON 5 VISION [place]    . REPLACEMENT TOTAL KNEE Right   . ROTATOR CUFF REPAIR Right about 2006   Dr. Sabra Heck   Family History  Problem Relation Age of Onset  . Leukemia Mother   . Lung cancer Father   . Dementia Sister   . Alzheimer's disease Sister   . Heart attack Brother   . Rheum arthritis Sister   . COPD Sister   . Breast cancer Neg Hx  Social History   Socioeconomic History  . Marital status: Widowed    Spouse name: Not on file  . Number of children: 3  . Years of education: 73  . Highest education level: High school graduate  Occupational History  . Occupation: Retired    Comment: former Microbiologist  . Financial resource strain: Not hard at all  . Food insecurity    Worry: Never true    Inability: Never true  . Transportation needs    Medical: No    Non-medical: No  Tobacco Use  . Smoking status: Never Smoker  . Smokeless tobacco: Never Used  Substance and  Sexual Activity  . Alcohol use: Yes    Alcohol/week: 0.0 - 1.0 standard drinks  . Drug use: No  . Sexual activity: Not Currently  Lifestyle  . Physical activity    Days per week: 0 days    Minutes per session: 0 min  . Stress: Not at all  Relationships  . Social Herbalist on phone: Patient refused    Gets together: Patient refused    Attends religious service: Patient refused    Active member of club or organization: Patient refused    Attends meetings of clubs or organizations: Patient refused    Relationship status: Patient refused  Other Topics Concern  . Not on file  Social History Narrative  . Not on file    Outpatient Encounter Medications as of 04/10/2019  Medication Sig  . aspirin 81 MG chewable tablet Chew 1 tablet by mouth daily.  . Brimonidine Tartrate (LUMIFY) 0.025 % SOLN Apply to eye daily.  . cholecalciferol (VITAMIN D) 1000 UNITS tablet Take 1 tablet by mouth daily.  . Cyanocobalamin (VITAMIN B 12) 100 MCG LOZG Take 1 tablet by mouth daily.  . hydrochlorothiazide (HYDRODIURIL) 25 MG tablet Take 1 tablet (25 mg total) by mouth daily.  Marland Kitchen loratadine (CLARITIN) 10 MG tablet Take 1 tablet by mouth daily.   . montelukast (SINGULAIR) 10 MG tablet TAKE 1 TABLET EVERY NIGHT AT BEDTIME  . naproxen sodium (ANAPROX) 220 MG tablet Take 220 mg by mouth 2 (two) times daily with a meal.  . NON FORMULARY Neuriva OTC once daily  . NONFORMULARY OR COMPOUNDED ITEM SeroVital- hgh 4 tablets daily  . sertraline (ZOLOFT) 100 MG tablet TAKE ONE TABLET BY MOUTH EVERY DAY (Patient taking differently: Take 50 mg by mouth daily. )  . traZODone (DESYREL) 100 MG tablet TAKE ONE-HALF TO ONE TABLET ONE HOUR BEFORE BEDTIME.   No facility-administered encounter medications on file as of 04/10/2019.     Activities of Daily Living In your present state of health, do you have any difficulty performing the following activities: 04/10/2019  Hearing? N  Vision? N  Difficulty  concentrating or making decisions? N  Walking or climbing stairs? N  Dressing or bathing? N  Doing errands, shopping? N  Preparing Food and eating ? N  Using the Toilet? N  In the past six months, have you accidently leaked urine? N  Do you have problems with loss of bowel control? N  Managing your Medications? N  Managing your Finances? N  Housekeeping or managing your Housekeeping? N  Some recent data might be hidden    Patient Care Team: Birdie Sons, MD as PCP - General (Family Medicine) Emmaline Kluver., MD (Rheumatology)    Assessment:   This is a routine wellness examination for McGrath.  Exercise Activities and Dietary recommendations Current  Exercise Habits: The patient does not participate in regular exercise at present, Exercise limited by: None identified  Goals    . Exercise 3x per week (30 min per time)     Recommend to walk 3x per week (30 min per time).    . Increase water intake     Recommend drinking 6-8 glasses of water per day. Due to foul odor with voiding, would also recommend adding Cranberry juice as well.       Fall Risk: Fall Risk  04/10/2019 04/08/2018 04/04/2017 04/03/2016 03/24/2015  Falls in the past year? 0 No Yes No No  Number falls in past yr: 0 - 1 - -  Injury with Fall? 0 - No - -  Risk for fall due to : - - Impaired balance/gait;Impaired vision;Medication side effect - -  Risk for fall due to: Comment - - Right knee repair and arthritis, eyeglasses - -  Follow up - - Education provided;Falls prevention discussed - -    FALL RISK PREVENTION PERTAINING TO THE HOME:  Any stairs in or around the home? Yes  If so, are there any without handrails? No   Home free of loose throw rugs in walkways, pet beds, electrical cords, etc? Yes  Adequate lighting in your home to reduce risk of falls? Yes   ASSISTIVE DEVICES UTILIZED TO PREVENT FALLS:  Life alert? Yes  Use of a cane, walker or w/c? No  Grab bars in the bathroom? Yes  Shower  chair or bench in shower? No  Elevated toilet seat or a handicapped toilet? Yes    TIMED UP AND GO:  Was the test performed? No .    Depression Screen PHQ 2/9 Scores 04/10/2019 04/08/2018 04/04/2017 04/03/2016  PHQ - 2 Score 0 1 1 3   PHQ- 9 Score - - - 7     Cognitive Function: Declined today.      6CIT Screen 04/04/2017  What Year? 0 points  What month? 0 points  What time? 0 points  Count back from 20 0 points  Months in reverse 0 points  Repeat phrase 8 points  Total Score 8    Immunization History  Administered Date(s) Administered  . Influenza, High Dose Seasonal PF 04/11/2017, 04/08/2018  . Pneumococcal Conjugate-13 03/12/2014  . Pneumococcal Polysaccharide-23 03/22/2005  . Td 01/19/1997, 11/23/2008  . Tdap 12/11/2015  . Zoster 10/28/2007  . Zoster Recombinat (Shingrix) 05/29/2018, 08/16/2018    Qualifies for Shingles Vaccine? Completed series  Tdap: Up to date  Flu Vaccine: Due for Flu vaccine. Does the patient want to receive this vaccine today?  No .   Pneumococcal Vaccine: Completed series  Screening Tests Health Maintenance  Topic Date Due  . INFLUENZA VACCINE  02/01/2019  . DEXA SCAN  05/22/2020  . TETANUS/TDAP  12/10/2025  . PNA vac Low Risk Adult  Completed    Cancer Screenings:  Colorectal Screening: No longer required.   Mammogram: No longer required.   Bone Density: Completed 05/22/17. Results reflect OSTEOPENIA. Repeat every 3 years.   Lung Cancer Screening: (Low Dose CT Chest recommended if Age 34-80 years, 30 pack-year currently smoking OR have quit w/in 15years.) does not qualify.   Additional Screening:  Vision Screening: Recommended annual ophthalmology exams for early detection of glaucoma and other disorders of the eye.  Dental Screening: Recommended annual dental exams for proper oral hygiene  Community Resource Referral:  CRR required this visit?  No       Plan:  I have personally  reviewed and addressed the Medicare  Annual Wellness questionnaire and have noted the following in the patient's chart:  A. Medical and social history B. Use of alcohol, tobacco or illicit drugs  C. Current medications and supplements D. Functional ability and status E.  Nutritional status F.  Physical activity G. Advance directives H. List of other physicians I.  Hospitalizations, surgeries, and ER visits in previous 12 months J.  [place] such as hearing and vision if needed, cognitive and depression L. Referrals and appointments   In addition, I have reviewed and discussed with patient certain preventive protocols, quality metrics, and best practice recommendations. A written personalized care plan for preventive services as well as general preventive health recommendations were provided to patient. Nurse Health Advisor  Signed,    Mylan Lengyel [place], [place]  [phone] Nurse Health Advisor   Nurse Notes: Pt to receive her flu shot at next in office apt on 04/21/19.

## 2019-04-10 ENCOUNTER — Other Ambulatory Visit: Payer: Self-pay

## 2019-04-10 ENCOUNTER — Ambulatory Visit (INDEPENDENT_AMBULATORY_CARE_PROVIDER_SITE_OTHER): Payer: Medicare Other

## 2019-04-10 DIAGNOSIS — Z0000 Encounter for general adult medical examination without abnormal findings: Secondary | ICD-10-CM | POA: Diagnosis not present

## 2019-04-10 NOTE — Patient Instructions (Signed)
Lynn Mccoy , Thank you for taking time to come for your Medicare Wellness Visit. I appreciate your ongoing commitment to your health goals. Please review the following plan we discussed and let me know if I can assist you in the future.   Screening recommendations/referrals: Colonoscopy: No longer required.  Mammogram: No longer required.  Bone Density: Up to date, due 05/2020 Recommended yearly ophthalmology/optometry visit for glaucoma screening and checkup Recommended yearly dental visit for hygiene and checkup  Vaccinations: Influenza vaccine: Currently due Pneumococcal vaccine: Completed series Tdap vaccine: Up to date, due 12/2025 Shingles vaccine: Completed series    Advanced directives: Please bring a copy of your POA (Power of Attorney) and/or Living Will to your next appointment.   Conditions/risks identified: Recommend to start walking 3 days a week for at least 30 minutes at a time.   Next appointment: 04/21/19 @ 9:00 AM with Dr Caryn Section.    Preventive Care 26 Years and Older, Female Preventive care refers to lifestyle choices and visits with your health care provider that can promote health and wellness. What does preventive care include?  A yearly physical exam. This is also called an annual well check.  Dental exams once or twice a year.  Routine eye exams. Ask your health care provider how often you should have your eyes checked.  Personal lifestyle choices, including:  Daily care of your teeth and gums.  Regular physical activity.  Eating a healthy diet.  Avoiding tobacco and drug use.  Limiting alcohol use.  Practicing safe sex.  Taking low-dose aspirin every day.  Taking vitamin and mineral supplements as recommended by your health care provider. What happens during an annual well check? The services and screenings done by your health care provider during your annual well check will depend on your age, overall health, lifestyle risk factors, and  family history of disease. Counseling  Your health care provider may ask you questions about your:  Alcohol use.  Tobacco use.  Drug use.  Emotional well-being.  Home and relationship well-being.  Sexual activity.  Eating habits.  History of falls.  Memory and ability to understand (cognition).  Work and work Statistician.  Reproductive health. Screening  You may have the following tests or measurements:  Height, weight, and BMI.  Blood pressure.  Lipid and cholesterol levels. These may be checked every 5 years, or more frequently if you are over 78 years old.  Skin check.  Lung cancer screening. You may have this screening every year starting at age 78 if you have a 30-pack-year history of smoking and currently smoke or have quit within the past 15 years.  Fecal occult blood test (FOBT) of the stool. You may have this test every year starting at age 64.  Flexible sigmoidoscopy or colonoscopy. You may have a sigmoidoscopy every 5 years or a colonoscopy every 10 years starting at age 41.  Hepatitis C blood test.  Hepatitis B blood test.  Sexually transmitted disease (STD) testing.  Diabetes screening. This is done by checking your blood sugar (glucose) after you have not eaten for a while (fasting). You may have this done every 1-3 years.  Bone density scan. This is done to screen for osteoporosis. You may have this done starting at age 87.  Mammogram. This may be done every 1-2 years. Talk to your health care provider about how often you should have regular mammograms. Talk with your health care provider about your test results, treatment options, and if necessary, the need for  more tests. Vaccines  Your health care provider may recommend certain vaccines, such as:  Influenza vaccine. This is recommended every year.  Tetanus, diphtheria, and acellular pertussis (Tdap, Td) vaccine. You may need a Td booster every 10 years.  Zoster vaccine. You may need this  after age 71.  Pneumococcal 13-valent conjugate (PCV13) vaccine. One dose is recommended after age 103.  Pneumococcal polysaccharide (PPSV23) vaccine. One dose is recommended after age 22. Talk to your health care provider about which screenings and vaccines you need and how often you need them. This information is not intended to replace advice given to you by your health care provider. Make sure you discuss any questions you have with your health care provider. Document Released: 07/16/2015 Document Revised: 03/08/2016 Document Reviewed: 04/20/2015 Elsevier Interactive Patient Education  2017 [place] Prevention in the Home Falls can cause injuries. They can happen to people of all ages. There are many things you can do to make your home safe and to help prevent falls. What can I do on the outside of my home?  Regularly fix the edges of walkways and driveways and fix any cracks.  Remove anything that might make you trip as you walk through a door, such as a raised step or threshold.  Trim any bushes or trees on the path to your home.  Use bright outdoor lighting.  Clear any walking paths of anything that might make someone trip, such as rocks or tools.  Regularly check to see if handrails are loose or broken. Make sure that both sides of any steps have handrails.  Any raised decks and porches should have guardrails on the edges.  Have any leaves, snow, or ice cleared regularly.  Use sand or salt on walking paths during winter.  Clean up any spills in your garage right away. This includes oil or grease spills. What can I do in the bathroom?  Use night lights.  Install grab bars by the toilet and in the tub and shower. Do not use towel bars as grab bars.  Use non-skid mats or decals in the tub or shower.  If you need to sit down in the shower, use a plastic, non-slip stool.  Keep the floor dry. Clean up any water that spills on the floor as soon as it happens.   Remove soap buildup in the tub or shower regularly.  Attach bath mats securely with double-sided non-slip rug tape.  Do not have throw rugs and other things on the floor that can make you trip. What can I do in the bedroom?  Use night lights.  Make sure that you have a light by your bed that is easy to reach.  Do not use any sheets or blankets that are too big for your bed. They should not hang down onto the floor.  Have a firm chair that has side arms. You can use this for support while you get dressed.  Do not have throw rugs and other things on the floor that can make you trip. What can I do in the kitchen?  Clean up any spills right away.  Avoid walking on wet floors.  Keep items that you use a lot in easy-to-reach places.  If you need to reach something above you, use a strong step stool that has a grab bar.  Keep electrical cords out of the way.  Do not use floor polish or wax that makes floors slippery. If you must use wax, use non-skid  floor wax.  Do not have throw rugs and other things on the floor that can make you trip. What can I do with my stairs?  Do not leave any items on the stairs.  Make sure that there are handrails on both sides of the stairs and use them. Fix handrails that are broken or loose. Make sure that handrails are as long as the stairways.  Check any carpeting to make sure that it is firmly attached to the stairs. Fix any carpet that is loose or worn.  Avoid having throw rugs at the top or bottom of the stairs. If you do have throw rugs, attach them to the floor with carpet tape.  Make sure that you have a light switch at the top of the stairs and the bottom of the stairs. If you do not have them, ask someone to add them for you. What else can I do to help prevent falls?  Wear shoes that:  Do not have high heels.  Have rubber bottoms.  Are comfortable and fit you well.  Are closed at the toe. Do not wear sandals.  If you use a  stepladder:  Make sure that it is fully opened. Do not climb a closed stepladder.  Make sure that both sides of the stepladder are locked into place.  Ask someone to hold it for you, if possible.  Clearly mark and make sure that you can see:  Any grab bars or handrails.  First and last steps.  Where the edge of each step is.  Use tools that help you move around (mobility aids) if they are needed. These include:  Canes.  Walkers.  Scooters.  Crutches.  Turn on the lights when you go into a dark area. Replace any light bulbs as soon as they burn out.  Set up your furniture so you have a clear path. Avoid moving your furniture around.  If any of your floors are uneven, fix them.  If there are any pets around you, be aware of where they are.  Review your medicines with your doctor. Some medicines can make you feel dizzy. This can increase your chance of falling. Ask your doctor what other things that you can do to help prevent falls. This information is not intended to replace advice given to you by your health care provider. Make sure you discuss any questions you have with your health care provider. Document Released: 04/15/2009 Document Revised: 11/25/2015 Document Reviewed: 07/24/2014 Elsevier Interactive Patient Education  2017 [employer].

## 2019-04-18 ENCOUNTER — Other Ambulatory Visit: Payer: Self-pay | Admitting: Family Medicine

## 2019-04-18 DIAGNOSIS — I10 Essential (primary) hypertension: Secondary | ICD-10-CM

## 2019-04-21 ENCOUNTER — Ambulatory Visit (INDEPENDENT_AMBULATORY_CARE_PROVIDER_SITE_OTHER): Payer: Medicare Other | Admitting: Family Medicine

## 2019-04-21 ENCOUNTER — Other Ambulatory Visit: Payer: Self-pay

## 2019-04-21 ENCOUNTER — Other Ambulatory Visit: Payer: Self-pay | Admitting: Family Medicine

## 2019-04-21 ENCOUNTER — Encounter: Payer: Self-pay | Admitting: Family Medicine

## 2019-04-21 VITALS — BP 136/64 | HR 92 | Temp 96.2°F | Resp 15 | Ht 67.0 in | Wt 192.0 lb

## 2019-04-21 DIAGNOSIS — Z0000 Encounter for general adult medical examination without abnormal findings: Secondary | ICD-10-CM

## 2019-04-21 DIAGNOSIS — Z23 Encounter for immunization: Secondary | ICD-10-CM

## 2019-04-21 DIAGNOSIS — I10 Essential (primary) hypertension: Secondary | ICD-10-CM | POA: Diagnosis not present

## 2019-04-21 DIAGNOSIS — Z1231 Encounter for screening mammogram for malignant neoplasm of breast: Secondary | ICD-10-CM

## 2019-04-21 DIAGNOSIS — F3289 Other specified depressive episodes: Secondary | ICD-10-CM

## 2019-04-21 NOTE — Progress Notes (Signed)
Patient: Lynn Mccoy, Female    DOB: [birth year], 79 y.o.   MRN: [id number] Visit Date: 04/21/2019  Today's Provider: [name], MD   Chief Complaint  Patient presents with  . Annual Exam   Subjective:     Complete Physical Lynn Mccoy is a 78 y.o. female. She feels well. She reports exercising by doing yard work. She reports she is sleeping fairly well.  -----------------------------------------------------------  Hypertension, follow-up:  BP Readings from Last 3 Encounters:  04/21/19 136/64  05/22/18 128/74  04/17/18 (!) 158/68    She was last seen for hypertension 11 months ago.  BP at that visit was 128/74. Management since that visit includes no changes. She reports excellent compliance with treatment. She is not having side effects.  She is exercising. She is not adherent to low salt diet.   Outside blood pressures are not being checked. She is experiencing none.  Patient denies chest pain, chest pressure/discomfort, claudication, dyspnea, exertional chest pressure/discomfort, fatigue, irregular heart beat, lower extremity edema, near-syncope, orthopnea, palpitations, paroxysmal nocturnal dyspnea, syncope and tachypnea.   Cardiovascular risk factors include advanced age (older than 74 for men, 14 for women) and hypertension.  Use of agents associated with hypertension: NSAIDS.     Weight trend: fluctuating a bit Wt Readings from Last 3 Encounters:  04/21/19 192 lb (87.1 kg)  05/22/18 196 lb 9.6 oz (89.2 kg)  04/17/18 203 lb (92.1 kg)    Current diet: well balanced  ------------------------------------------------------------------------  Follow up for Insomnia:  The patient was last seen for this 1 years ago. Changes made at last visit include none.  She reports excellent compliance with treatment. She feels that condition is Unchanged. Patient reports that she wakes up at least 4x a night to use restroom.  She is not having side  effects.   ------------------------------------------------------------------------------------  Follow up for Depression:  The patient was last seen for this 1 years ago. Changes made at last visit include none; continue 50mg  Sertraline daily. .  She reports excellent compliance with treatment. She feels that condition is Unchanged. She is not having side effects.   ------------------------------------------------------------------------------------   Review of Systems  Constitutional: Positive for fatigue. Negative for chills and fever.  HENT: Positive for drooling. Negative for congestion, ear pain, rhinorrhea, sneezing and sore throat.   Eyes: Negative.  Negative for pain and redness.  Respiratory: Negative for cough, shortness of breath and wheezing.   Cardiovascular: Negative for chest pain and leg swelling.  Gastrointestinal: Positive for abdominal distention. Negative for abdominal pain, blood in stool, constipation, diarrhea and nausea.  Endocrine: Negative for polydipsia and polyphagia.  Genitourinary: Negative.  Negative for dysuria, flank pain, hematuria, pelvic pain, vaginal bleeding and vaginal discharge.  Musculoskeletal: Positive for arthralgias. Negative for back pain, gait problem and joint swelling.  Skin: Negative for rash.  Neurological: Positive for numbness. Negative for dizziness, tremors, seizures, weakness, light-headedness and headaches.  Hematological: Negative for adenopathy.  Psychiatric/Behavioral: Negative.  Negative for behavioral problems, confusion and dysphoric mood. The patient is not nervous/anxious and is not hyperactive.     Social History   Socioeconomic History  . Marital status: Widowed    Spouse name: Not on file  . Number of children: 3  . Years of education: 52  . Highest education level: High school graduate  Occupational History  . Occupation: Retired    Comment: former Microbiologist  . Financial resource strain:  Not hard at all  .  Food insecurity    Worry: Never true    Inability: Never true  . Transportation needs    Medical: No    Non-medical: No  Tobacco Use  . Smoking status: Never Smoker  . Smokeless tobacco: Never Used  Substance and Sexual Activity  . Alcohol use: Yes    Alcohol/week: 0.0 - 1.0 standard drinks  . Drug use: No  . Sexual activity: Not Currently  Lifestyle  . Physical activity    Days per week: 0 days    Minutes per session: 0 min  . Stress: Not at all  Relationships  . Social Herbalist on phone: Patient refused    Gets together: Patient refused    Attends religious service: Patient refused    Active member of club or organization: Patient refused    Attends meetings of clubs or organizations: Patient refused    Relationship status: Patient refused  . Intimate partner violence    Fear of current or ex partner: Patient refused    Emotionally abused: Patient refused    Physically abused: Patient refused    Forced sexual activity: Patient refused  Other Topics Concern  . Not on file  Social History Narrative  . Not on file    Past Medical History:  Diagnosis Date  . Cancer ([place])    ovarian ca  . Depression   . History of cervical cancer 12/29/2014   stage 4, s/p complete hysterectomy   . History of mumps as a child   . Hyperlipidemia   . Osteoarthrosis    knees  . Osteopenia   . Wears dentures    full upper and lower     Patient Active Problem List   Diagnosis Date Noted  . Essential hypertension 04/17/2018  . Presence of right artificial knee joint 12/12/2016  . Allergic rhinitis 01/22/2015  . Depression 12/29/2014  . Fatigue 12/29/2014  . Arthritis, degenerative 11/06/2013  . History of repair of rotator cuff 11/06/2013  . Insomnia 12/06/2009  . Adjustment disorder with depressed mood 01/13/2009  . Osteopenia 11/24/2008  . Diverticulosis of colon without hemorrhage 10/29/2008  . Obesity 10/29/2008  . Osteoarthrosis 10/29/2008     Past Surgical History:  Procedure Laterality Date  . ABDOMINAL HYSTERECTOMY  1977   due to cervical cancer stage 4  . APPENDECTOMY  1977  . arthritis Bilateral   . CATARACT EXTRACTION W/PHACO Left 03/06/2018   Procedure: CATARACT EXTRACTION PHACO AND INTRAOCULAR LENS PLACEMENT ([place]) COMPLICATED LEFT;  Surgeon: Leandrew Koyanagi, MD;  Location: [facility];  Service: Ophthalmology;  Laterality: Left;  HEALON 5 VISION [place]    . REPLACEMENT TOTAL KNEE Right   . ROTATOR CUFF REPAIR Right about 2006   Dr. Sabra Heck    Her family history includes Alzheimer's disease in her sister; COPD in her sister; Dementia in her sister; Heart attack in her brother; Leukemia in her mother; Lung cancer in her father; Rheum arthritis in her sister. There is no history of Breast cancer.   Current Outpatient Medications:  .  aspirin 81 MG chewable tablet, Chew 1 tablet by mouth daily., Disp: , Rfl:  .  Brimonidine Tartrate (LUMIFY) 0.025 % SOLN, Apply to eye daily., Disp: , Rfl:  .  cholecalciferol (VITAMIN D) 1000 UNITS tablet, Take 1 tablet by mouth daily., Disp: , Rfl:  .  Cyanocobalamin (VITAMIN B 12) 100 MCG LOZG, Take 1 tablet by mouth daily., Disp: , Rfl:  .  hydrochlorothiazide (HYDRODIURIL) 25 MG tablet, Take 1 tablet (25 mg total) by mouth daily., Disp: 90 tablet, Rfl: 4 .  loratadine (CLARITIN) 10 MG tablet, Take 1 tablet by mouth daily. , Disp: , Rfl:  .  montelukast (SINGULAIR) 10 MG tablet, TAKE 1 TABLET EVERY NIGHT AT BEDTIME, Disp: 30 tablet, Rfl: 12 .  naproxen sodium (ANAPROX) 220 MG tablet, Take 220 mg by mouth 2 (two) times daily with a meal., Disp: , Rfl:  .  NON FORMULARY, Neuriva OTC once daily, Disp: , Rfl:  .  NONFORMULARY OR COMPOUNDED ITEM, SeroVital- hgh 4 tablets daily, Disp: , Rfl:  .  sertraline (ZOLOFT) 100 MG tablet, TAKE ONE TABLET BY MOUTH EVERY DAY (Patient taking differently: Take 50 mg by mouth daily. ), Disp: 30  tablet, Rfl: 11 .  traZODone (DESYREL) 100 MG tablet, TAKE ONE-HALF TO ONE TABLET ONE HOUR BEFORE BEDTIME., Disp: 30 tablet, Rfl: 12  Patient Care Team: Birdie Sons, MD as PCP - General (Family Medicine) Emmaline Kluver., MD (Rheumatology)     Objective:    Vitals: BP 136/64   Pulse 92   Temp (!) 96.2 F (35.7 C) (Oral)   Resp 15   Ht 5\' 7"  (1.702 m)   Wt 192 lb (87.1 kg)   SpO2 95%   BMI 30.07 kg/m   Physical Exam   General Appearance:    Mildly obese female. Alert, cooperative, in distress, appears stated age   Head:    Normocephalic, without obvious abnormality, atraumatic  Eyes:    PERRL, conjunctiva/corneas clear, EOM's intact, fundi    benign, both eyes  Ears:    Normal TM's and external ear canals, both ears  Nose:   Nares normal, septum midline, mucosa normal, no drainage    or sinus tenderness  Throat:   Lips, mucosa, and tongue normal; teeth and gums normal  Neck:   Supple, symmetrical, trachea midline, no adenopathy;    thyroid:  no enlargement/tenderness/nodules; no carotid   bruit or JVD  Back:     Symmetric, no curvature, ROM normal, no CVA tenderness  Lungs:     Clear to auscultation bilaterally, respirations unlabored  Chest Wall:    No tenderness or deformity   Heart:    Normal heart rate. Normal rhythm. No murmurs, rubs, or gallops.   Breast Exam:    normal appearance, no masses or tenderness  Abdomen:     Soft, non-tender, bowel sounds active all four quadrants,    no masses, no organomegaly  Pelvic:    deferred  Extremities:   All extremities are intact. No cyanosis or edema  Pulses:   2+ and symmetric all extremities  Skin:   Skin color, texture, turgor normal, no rashes or lesions  Lymph nodes:   Cervical, supraclavicular, and axillary nodes normal  Neurologic:   CNII-XII intact, normal strength, sensation and reflexes    throughout    Activities of Daily Living In your present state of health, do you have any difficulty performing  the following activities: 04/10/2019  Hearing? N  Vision? N  Difficulty concentrating or making decisions? N  Walking or climbing stairs? N  Dressing or bathing? N  Doing errands, shopping? N  Preparing Food and eating ? N  Using the Toilet? N  In the past six months, have you accidently leaked urine? N  Do you have problems with loss of bowel control? N  Managing your Medications? N  Managing your Finances? N  Housekeeping or managing  your Housekeeping? N  Some recent data might be hidden    Fall Risk Assessment Fall Risk  04/10/2019 04/08/2018 04/04/2017 04/03/2016 03/24/2015  Falls in the past year? 0 No Yes No No  Number falls in past yr: 0 - 1 - -  Injury with Fall? 0 - No - -  Risk for fall due to : - - Impaired balance/gait;Impaired vision;Medication side effect - -  Risk for fall due to: Comment - - Right knee repair and arthritis, eyeglasses - -  Follow up - - Education provided;Falls prevention discussed - -     Depression Screen PHQ 2/9 Scores 04/10/2019 04/08/2018 04/04/2017 04/03/2016  PHQ - 2 Score 0 1 1 3   PHQ- 9 Score - - - 7    6CIT Screen 04/04/2017  What Year? 0 points  What month? 0 points  What time? 0 points  Count back from 20 0 points  Months in reverse 0 points  Repeat phrase 8 points  Total Score 8       Assessment & Plan:    Annual Physical Reviewed patient's Family Medical History Reviewed and updated list of patient's medical providers Assessment of cognitive impairment was done Assessed patient's functional ability Established a written schedule for health screening [place] Completed and Reviewed  Exercise Activities and Dietary recommendations Goals    . Exercise 3x per week (30 min per time)     Recommend to walk 3x per week (30 min per time).    . Increase water intake     Recommend drinking 6-8 glasses of water per day. Due to foul odor with voiding, would also recommend adding Cranberry juice as well.        Immunization History  Administered Date(s) Administered  . Fluad Quad(high Dose 65+) 04/21/2019  . Influenza, High Dose Seasonal PF 04/11/2017, 04/08/2018  . Pneumococcal Conjugate-13 03/12/2014  . Pneumococcal Polysaccharide-23 03/22/2005  . Td 01/19/1997, 11/23/2008  . Tdap 12/11/2015  . Zoster 10/28/2007  . Zoster Recombinat (Shingrix) 05/29/2018, 08/16/2018    Health Maintenance  Topic Date Due  . INFLUENZA VACCINE  02/01/2019  . DEXA SCAN  05/22/2020  . TETANUS/TDAP  12/10/2025  . PNA vac Low Risk Adult  Completed     Discussed health benefits of physical activity, and encouraged her to engage in regular exercise appropriate for her age and condition.    ------------------------------------------------------------------------------------------------------------  1. Need for influenza vaccination  - Flu Vaccine QUAD High Dose(Fluad)  2. Annual physical exam   3. Essential hypertension Well controlled.  Continue current medications.   - EKG 12-Lead - Renal function panel  4. Other depression Doing well on sertraline. Continue current medications.       The entirety of the information documented in the History of Present Illness, Review of Systems and Physical Exam were personally obtained by me. Portions of this information were initially documented by Minette Headland, CMA and reviewed by me for thoroughness and accuracy.    [name], MD  [facility]

## 2019-04-21 NOTE — Patient Instructions (Signed)
.   Please review the attached list of medications and notify my office if there are any errors.   . Please bring all of your medications to every appointment so we can make sure that our medication list is the same as yours.   . It is especially important to get the annual flu vaccine this year. If you haven't had it already, please go to your pharmacy or call the office as soon as possible to schedule you flu shot.  

## 2019-04-22 LAB — RENAL FUNCTION PANEL
Albumin: 4.5 g/dL (ref 3.7–4.7)
BUN/Creatinine Ratio: 23 (ref 12–28)
BUN: 22 mg/dL (ref 8–27)
CO2: 23 mmol/L (ref 20–29)
Calcium: 9.4 mg/dL (ref 8.7–10.3)
Chloride: 103 mmol/L (ref 96–106)
Creatinine, Ser: 0.95 mg/dL (ref 0.57–1.00)
GFR calc Af Amer: 66 mL/min/{1.73_m2} (ref >59)
GFR calc non Af Amer: 57 mL/min/{1.73_m2} — ABNORMAL LOW (ref >59)
Glucose: 102 mg/dL — ABNORMAL HIGH (ref 65–99)
Phosphorus: 3.1 mg/dL (ref 3.0–4.3)
Potassium: 4.5 mmol/L (ref 3.5–5.2)
Sodium: 140 mmol/L (ref 134–144)

## 2019-05-27 ENCOUNTER — Ambulatory Visit
Admission: RE | Admit: 2019-05-27 | Discharge: 2019-05-27 | Disposition: A | Discharge status: Home / Self Care | Payer: Medicare Other | Source: Ambulatory Visit | Attending: Family Medicine | Admitting: Family Medicine

## 2019-05-27 DIAGNOSIS — Z1231 Encounter for screening mammogram for malignant neoplasm of breast: Secondary | ICD-10-CM | POA: Diagnosis not present

## 2019-09-29 ENCOUNTER — Other Ambulatory Visit: Payer: Self-pay | Admitting: Family Medicine

## 2019-09-29 DIAGNOSIS — G4700 Insomnia, unspecified: Secondary | ICD-10-CM

## 2019-09-29 NOTE — Telephone Encounter (Signed)
Prescription has expired. Please advise if OK to send in new prescription.

## 2019-10-01 ENCOUNTER — Other Ambulatory Visit: Payer: Self-pay | Admitting: Family Medicine

## 2019-10-02 ENCOUNTER — Other Ambulatory Visit: Payer: Self-pay | Admitting: Family Medicine

## 2019-10-02 MED ORDER — MONTELUKAST SODIUM 10 MG PO TABS: 10.0000 mg | ORAL_TABLET | Freq: Every day | ORAL | 12 refills | Status: DC

## 2019-10-02 NOTE — Telephone Encounter (Signed)
[facility] faxed refill request for the following medications:  montelukast (SINGULAIR) 10 MG tablet   Last Rx: 07/10/2018 LOV: 04/21/2019 Please advise. Thanks TNP

## 2019-10-27 DIAGNOSIS — G5601 Carpal tunnel syndrome, right upper limb: Secondary | ICD-10-CM | POA: Diagnosis not present

## 2019-10-27 DIAGNOSIS — G5603 Carpal tunnel syndrome, bilateral upper limbs: Secondary | ICD-10-CM | POA: Diagnosis not present

## 2019-10-27 DIAGNOSIS — G5602 Carpal tunnel syndrome, left upper limb: Secondary | ICD-10-CM | POA: Diagnosis not present

## 2019-10-27 DIAGNOSIS — M79642 Pain in left hand: Secondary | ICD-10-CM | POA: Diagnosis not present

## 2019-10-27 DIAGNOSIS — M79641 Pain in right hand: Secondary | ICD-10-CM | POA: Diagnosis not present

## 2019-11-17 ENCOUNTER — Encounter: Payer: Self-pay | Admitting: Family Medicine

## 2019-11-17 ENCOUNTER — Ambulatory Visit (INDEPENDENT_AMBULATORY_CARE_PROVIDER_SITE_OTHER): Payer: Medicare Other | Admitting: Family Medicine

## 2019-11-17 DIAGNOSIS — K529 Noninfective gastroenteritis and colitis, unspecified: Secondary | ICD-10-CM | POA: Diagnosis not present

## 2019-11-17 NOTE — Progress Notes (Signed)
Virtual telephone visit    Virtual Visit via Telephone Note   This visit type was conducted due to national recommendations for restrictions regarding the COVID-19 Pandemic (e.g. social distancing) in an effort to limit this patient's exposure and mitigate transmission in our community. Due to her co-morbid illnesses, this patient is at least at moderate risk for complications without adequate follow up. This format is felt to be most appropriate for this patient at this time. The patient did not have access to video technology or had technical difficulties with video requiring transitioning to audio format only (telephone). Physical exam was limited to content and character of the telephone converstion.    Patient location: home Provider location: office   Visit Date: 11/17/2019  Today's healthcare provider: Vernie Murders, PA   No chief complaint on file.  Subjective    HPI This 79 year old female ws having some "hurting" in her legs and nausea with vomiting while at the hair dresser on 11-15-19. After vomiting she had some headache and diarrhea 2-3 times. No fever but feeling fatigued now. Had her second [place] vaccination in March 2021. Denies fever, cough, loss of taste or any other respiratory symptoms. States she is able to eat and drink without nausea now. States she is beginning to feel better.    Past Medical History:  Diagnosis Date  . Cancer ([place])    ovarian ca  . Depression   . History of cervical cancer 12/29/2014   stage 4, s/p complete hysterectomy   . History of mumps as a child   . Hyperlipidemia   . Osteoarthrosis    knees  . Osteopenia   . Wears dentures    full upper and lower   Past Surgical History:  Procedure Laterality Date  . ABDOMINAL HYSTERECTOMY  1977   due to cervical cancer stage 4  . APPENDECTOMY  1977  . arthritis Bilateral   . CATARACT EXTRACTION W/PHACO Left 03/06/2018   Procedure: CATARACT EXTRACTION PHACO AND INTRAOCULAR LENS  PLACEMENT ([place]) COMPLICATED LEFT;  Surgeon: Leandrew Koyanagi, MD;  Location: [facility];  Service: Ophthalmology;  Laterality: Left;  HEALON 5 VISION [place]    . REPLACEMENT TOTAL KNEE Right   . ROTATOR CUFF REPAIR Right about 2006   Dr. Sabra Heck   Family History  Problem Relation Age of Onset  . Leukemia Mother   . Lung cancer Father   . Dementia Sister   . Alzheimer's disease Sister   . Heart attack Brother   . Rheum arthritis Sister   . COPD Sister   . Breast cancer Neg Hx    Allergies  Allergen Reactions  . Peanut-Containing Drug Products Swelling    (peanuts only - caused mouth swelling)   Medications: Outpatient Medications Prior to Visit  Medication Sig  . aspirin 81 MG chewable tablet Chew 1 tablet by mouth daily.  . Brimonidine Tartrate (LUMIFY) 0.025 % SOLN Apply to eye daily.  . cholecalciferol (VITAMIN D) 1000 UNITS tablet Take 1 tablet by mouth daily.  . Cyanocobalamin (VITAMIN B 12) 100 MCG LOZG Take 1 tablet by mouth daily.  . hydrochlorothiazide (HYDRODIURIL) 25 MG tablet Take 1 tablet (25 mg total) by mouth daily.  Mar[place] Kitchen loratadine (CLARITIN) 10 MG tablet Take 1 tablet by mouth daily.   . montelukast (SINGULAIR) 10 MG tablet Take 1 tablet (10 mg total) by mouth at bedtime.  . naproxen sodium (ANAPROX) 220 MG tablet Take 220 mg by  mouth 2 (two) times daily with a meal.  . NON FORMULARY Neuriva OTC once daily  . NONFORMULARY OR COMPOUNDED ITEM SeroVital- hgh 4 tablets daily  . sertraline (ZOLOFT) 100 MG tablet TAKE ONE TABLET BY MOUTH EVERY DAY (Patient taking differently: Take 50 mg by mouth daily. )  . traZODone (DESYREL) 100 MG tablet TAKE ONE-HALF TO ONE TABLET ONE HOUR BEFORE BEDTIME.   No facility-administered medications prior to visit.    Review of Systems  Constitutional: Positive for fatigue and fever. Negative for appetite change.  HENT: Negative for congestion, ear discharge, ear pain, postnasal  drip, rhinorrhea, sinus pressure, sinus pain, sore throat and tinnitus.   Respiratory: Positive for cough. Negative for chest tightness and shortness of breath (Pain with deep breathing, hurts sides bilat).   Gastrointestinal: Positive for diarrhea, nausea and vomiting. Negative for abdominal pain.  Neurological: Positive for headaches (just 3 days ago). Negative for dizziness.      Objective    There were no vitals taken for this visit.  Physical Exam: No apparent respiratory distress during telephonic interview.    Assessment & Plan     1. Gastroenteritis Onset with N&V and a little diarrhea 2 days ago. Feeling better today but low on energy. Encouraged to drink extra fluids (Gatorade or Pedialyte) and eat a bland diet. No further nausea, vomiting or diarrhea today. No COVID symptoms admitted. Advised to call or go to ER if fever over 100 develops.    No follow-ups on file.    I discussed the assessment and treatment plan with the patient. The patient was provided an opportunity to ask questions and all were answered. The patient agreed with the plan and demonstrated an understanding of the instructions.   The patient was advised to call back or seek an in-person evaluation if the symptoms worsen or if the condition fails to improve as anticipated.  I provided 15 minutes of non-face-to-face time during this encounter.  Andres Shad, PA, have reviewed all documentation for this visit. The documentation on 11/17/19 for the exam, diagnosis, procedures, and orders are all accurate and complete.   Vernie Murders, [place] [phone] (phone) [phone] (fax)  [place]

## 2019-11-27 DIAGNOSIS — M8949 Other hypertrophic osteoarthropathy, multiple sites: Secondary | ICD-10-CM | POA: Diagnosis not present

## 2019-11-27 DIAGNOSIS — G5602 Carpal tunnel syndrome, left upper limb: Secondary | ICD-10-CM | POA: Diagnosis not present

## 2019-11-27 DIAGNOSIS — G5601 Carpal tunnel syndrome, right upper limb: Secondary | ICD-10-CM | POA: Diagnosis not present

## 2019-11-27 DIAGNOSIS — M25562 Pain in left knee: Secondary | ICD-10-CM | POA: Diagnosis not present

## 2019-11-27 DIAGNOSIS — G8929 Other chronic pain: Secondary | ICD-10-CM | POA: Diagnosis not present

## 2020-04-20 NOTE — Progress Notes (Signed)
Subjective:   Lynn Mccoy is a 79 y.o. female who presents for Medicare Annual (Subsequent) preventive examination.  I connected with Lynn Mccoy today by telephone and verified that I am speaking with the correct person using two identifiers. Location patient: home Location provider: work Persons participating in the virtual visit: patient, provider.   I discussed the limitations, risks, security and privacy concerns of performing an evaluation and management service by telephone and the availability of in person appointments. I also discussed with the patient that there may be a patient responsible charge related to this service. The patient expressed understanding and verbally consented to this telephonic visit.    Interactive audio and video telecommunications were attempted between this provider and patient, however failed, due to patient having technical difficulties OR patient did not have access to video capability.  We continued and completed visit with audio only.   Review of Systems    N/A  Cardiac Risk Factors include: advanced age (>54men, >81 women);hypertension;obesity (BMI >30kg/m2)     Objective:    There were no vitals filed for this visit. There is no height or weight on file to calculate BMI.  Advanced Directives 04/21/2020 04/10/2019 04/08/2018 03/06/2018 04/04/2017 04/03/2016 12/11/2015  Does Patient Have a Medical Advance Directive? Yes Yes Yes Yes Yes Yes No  Type of Paramedic of [place];Living will [place];Living will [place];Living will [place];Living will Living will;Healthcare Power of Attorney Living will;Healthcare Power of Attorney -  Does patient want to make changes to medical advance directive? - - - No - Patient declined - - -  Copy of [place] in Chart? No - copy requested No - copy requested No - copy requested No - copy requested No - copy  requested - -  Would patient like information on creating a medical advance directive? - - - - - - No - patient declined information    Current Medications (verified) Outpatient Encounter Medications as of 04/21/2020  Medication Sig  . aspirin 81 MG chewable tablet Chew 1 tablet by mouth daily.  . Brimonidine Tartrate (LUMIFY) 0.025 % SOLN Apply to eye daily.  . cholecalciferol (VITAMIN D) 1000 UNITS tablet Take 1 tablet by mouth daily.  . Cyanocobalamin (VITAMIN B 12) 100 MCG LOZG Take 1 tablet by mouth daily.  . hydrochlorothiazide (HYDRODIURIL) 25 MG tablet Take 1 tablet (25 mg total) by mouth daily.  Marland Kitchen loratadine (CLARITIN) 10 MG tablet Take 1 tablet by mouth daily.   . montelukast (SINGULAIR) 10 MG tablet Take 1 tablet (10 mg total) by mouth at bedtime.  . naproxen sodium (ANAPROX) 220 MG tablet Take 220 mg by mouth 2 (two) times daily with a meal.  . NON FORMULARY Neuriva OTC once daily  . NONFORMULARY OR COMPOUNDED ITEM SeroVital- hgh 4 tablets daily  . sertraline (ZOLOFT) 100 MG tablet TAKE ONE TABLET BY MOUTH EVERY DAY (Patient taking differently: Take 50 mg by mouth daily. )  . traZODone (DESYREL) 100 MG tablet TAKE ONE-HALF TO ONE TABLET ONE HOUR BEFORE BEDTIME.   No facility-administered encounter medications on file as of 04/21/2020.    Allergies (verified) Peanut-containing drug products   History: Past Medical History:  Diagnosis Date  . Cancer ([place])    ovarian ca  . Depression   . History of cervical cancer 12/29/2014   stage 4, s/p complete hysterectomy   . History of mumps as a child   . Hyperlipidemia   .  Hypertension   . Osteoarthrosis    knees  . Osteopenia   . Wears dentures    full upper and lower   Past Surgical History:  Procedure Laterality Date  . ABDOMINAL HYSTERECTOMY  1977   due to cervical cancer stage 4  . APPENDECTOMY  1977  . arthritis Bilateral   . CATARACT EXTRACTION W/PHACO Left 03/06/2018   Procedure: CATARACT EXTRACTION PHACO AND  INTRAOCULAR LENS PLACEMENT ([place]) COMPLICATED LEFT;  Surgeon: Leandrew Koyanagi, MD;  Location: [facility];  Service: Ophthalmology;  Laterality: Left;  HEALON 5 VISION [place]    . REPLACEMENT TOTAL KNEE Right   . ROTATOR CUFF REPAIR Right about 2006   Dr. Sabra Heck   Family History  Problem Relation Age of Onset  . Leukemia Mother   . Lung cancer Father   . Dementia Sister   . Alzheimer's disease Sister   . Heart attack Brother   . Rheum arthritis Sister   . COPD Sister   . Breast cancer Neg Hx    Social History   Socioeconomic History  . Marital status: Widowed    Spouse name: Not on file  . Number of children: 3  . Years of education: 52  . Highest education level: High school graduate  Occupational History  . Occupation: Retired    Comment: former Haematologist  Tobacco Use  . Smoking status: Never Smoker  . Smokeless tobacco: Never Used  Vaping Use  . Vaping Use: Never used  Substance and Sexual Activity  . Alcohol use: Yes    Alcohol/week: 0.0 - 1.0 standard drinks  . Drug use: No  . Sexual activity: Not Currently  Other Topics Concern  . Not on file  Social History Narrative  . Not on file   Social Determinants of Health   Financial Resource Strain: Low Risk   . Difficulty of Paying Living Expenses: Not hard at all  Food Insecurity: No Food Insecurity  . Worried About Charity fundraiser in the Last Year: Never true  . Ran Out of Food in the Last Year: Never true  Transportation Needs: No Transportation Needs  . Lack of Transportation (Medical): No  . Lack of Transportation (Non-Medical): No  Physical Activity: Inactive  . Days of Exercise per Week: 0 days  . Minutes of Exercise per Session: 0 min  Stress: No Stress Concern Present  . Feeling of Stress : Only a little  Social Connections: Socially Isolated  . Frequency of Communication with Friends and Family: More than three times a week  .  Frequency of Social Gatherings with Friends and Family: More than three times a week  . Attends Religious Services: Never  . Active Member of Clubs or Organizations: No  . Attends Archivist Meetings: Never  . Marital Status: Widowed    Tobacco Counseling Counseling given: Not Answered   Clinical Intake:  Pre-visit preparation completed: Yes  Pain : No/denies pain     Nutritional Risks: None Diabetes: No  How often do you need to have someone help you when you read instructions, pamphlets, or other written materials from your doctor or pharmacy?: 1 - Never  Diabetic? No  Interpreter Needed?: No  Information entered by :: [facility], LPN   Activities of Daily Living In your present state of health, do you have any difficulty performing the following activities: 04/21/2020 04/21/2020  Hearing? N N  Vision? N N  Difficulty concentrating or making decisions? N  N  Walking or climbing stairs? N Y  Dressing or bathing? N N  Doing errands, shopping? N N  Preparing Food and eating ? N -  Using the Toilet? N -  In the past six months, have you accidently leaked urine? N -  Do you have problems with loss of bowel control? N -  Managing your Medications? N -  Managing your Finances? N -  Housekeeping or managing your Housekeeping? N -  Some recent data might be hidden    Patient Care Team: Birdie Sons, MD as PCP - General (Family Medicine) Emmaline Kluver., MD (Rheumatology) Leandrew Koyanagi, MD as Referring Physician (Ophthalmology)  Indicate any recent Medical Services you may have received from other than [facility] providers in the past year (date may be approximate).     Assessment:   This is a routine wellness examination for [place].  Hearing/Vision screen No exam data present  Dietary issues and exercise activities discussed: Current Exercise Habits: The patient does not participate in regular exercise at present, Exercise limited by: None  identified  Goals    . Exercise 3x per week (30 min per time)     Recommend to walk 3x per week (30 min per time).    . Increase water intake     Recommend drinking 6-8 glasses of water per day.       Depression Screen PHQ 2/9 Scores 04/21/2020 04/10/2019 04/08/2018 04/04/2017 04/03/2016 03/24/2015  PHQ - 2 Score 3 0 1 1 3  0  PHQ- 9 Score 8 - - - 7 3    Fall Risk Fall Risk  04/21/2020 04/21/2020 04/10/2019 04/08/2018 04/04/2017  Falls in the past year? 0 0 0 No Yes  Number falls in past yr: 0 0 0 - 1  Injury with Fall? 0 0 0 - No  Risk for fall due to : - - - - Impaired balance/gait;Impaired vision;Medication side effect  Risk for fall due to: Comment - - - - Right knee repair and arthritis, eyeglasses  Follow up - Falls evaluation completed - - Education provided;Falls prevention discussed    Any stairs in or around the home? Yes  If so, are there any without handrails? No  Home free of loose throw rugs in walkways, pet beds, electrical cords, etc? Yes  Adequate lighting in your home to reduce risk of falls? Yes   ASSISTIVE DEVICES UTILIZED TO PREVENT FALLS:  Life alert? Yes  Use of a cane, walker or w/c? No Grab bars in the bathroom? No  Shower chair or bench in shower? No  Elevated toilet seat or a handicapped toilet? Yes    Cognitive Function: Declined today.      6CIT Screen 04/04/2017  What Year? 0 points  What month? 0 points  What time? 0 points  Count back from 20 0 points  Months in reverse 0 points  Repeat phrase 8 points  Total Score 8    Immunizations Immunization History  Administered Date(s) Administered  . Fluad Quad(high Dose 65+) 04/21/2019, 04/21/2020  . Influenza, High Dose Seasonal PF 04/11/2017, 04/08/2018  . PFIZER SARS-COV-2 Vaccination 08/06/2019, 08/27/2019, 04/12/2020  . Pneumococcal Conjugate-13 03/12/2014  . Pneumococcal Polysaccharide-23 03/22/2005  . Td 01/19/1997, 11/23/2008  . Tdap 12/11/2015  . Zoster 10/28/2007  . Zoster  Recombinat (Shingrix) 05/29/2018, 08/16/2018    TDAP status: Up to date Flu Vaccine status: Up to date Pneumococcal vaccine status: Up to date Covid-19 vaccine status: Completed vaccines  Qualifies for Shingles Vaccine?  Yes   Zostavax completed Yes   Shingrix Completed?: Yes  Screening Tests Health Maintenance  Topic Date Due  . DEXA SCAN  05/22/2020  . TETANUS/TDAP  12/10/2025  . INFLUENZA VACCINE  Completed  . COVID-19 Vaccine  Completed  . PNA vac Low Risk Adult  Completed    Health Maintenance  There are no preventive care reminders to display for this patient.  Colorectal cancer screening: No longer required.  Mammogram status: No longer required.  Bone Density status: Completed 05/22/17. Results reflect: Bone density results: OSTEOPENIA. Repeat every 3 years.  Lung Cancer Screening: (Low Dose CT Chest recommended if Age 29-80 years, 30 pack-year currently smoking OR have quit w/in 15years.) does not qualify.   Additional Screening:  Vision Screening: Recommended annual ophthalmology exams for early detection of glaucoma and other disorders of the eye. Is the patient up to date with their annual eye exam?  Yes  Who is the provider or what is the name of the office in which the patient attends annual eye exams? [facility] If pt is not established with a provider, would they like to be referred to a provider to establish care? No .   Dental Screening: Recommended annual dental exams for proper oral hygiene  Community Resource Referral / Chronic Care Management: CRR required this visit?  No   CCM required this visit?  No      Plan:     I have personally reviewed and noted the following in the patient's chart:   . Medical and social history . Use of alcohol, tobacco or illicit drugs  . Current medications and supplements . Functional ability and status . Nutritional status . Physical activity . Advanced directives . List of other  physicians . Hospitalizations, surgeries, and ER visits in previous 12 months . Vitals . Screenings to include cognitive, depression, and falls . Referrals and appointments  In addition, I have reviewed and discussed with patient certain preventive protocols, quality metrics, and best practice recommendations. A written personalized care plan for preventive services as well as general preventive health recommendations were provided to patient.     Vinia Jemmott [place], [place]   00/51/1021   Nurse Notes: None.

## 2020-04-21 ENCOUNTER — Other Ambulatory Visit: Payer: Self-pay

## 2020-04-21 ENCOUNTER — Other Ambulatory Visit: Payer: Self-pay | Admitting: Family Medicine

## 2020-04-21 ENCOUNTER — Ambulatory Visit (INDEPENDENT_AMBULATORY_CARE_PROVIDER_SITE_OTHER): Payer: Medicare Other | Admitting: Family Medicine

## 2020-04-21 ENCOUNTER — Encounter: Payer: Self-pay | Admitting: Family Medicine

## 2020-04-21 ENCOUNTER — Ambulatory Visit (INDEPENDENT_AMBULATORY_CARE_PROVIDER_SITE_OTHER): Payer: Medicare Other

## 2020-04-21 VITALS — BP 132/72 | HR 67 | Temp 98.4°F | Resp 16 | Ht 67.0 in | Wt 182.8 lb

## 2020-04-21 DIAGNOSIS — I10 Essential (primary) hypertension: Secondary | ICD-10-CM

## 2020-04-21 DIAGNOSIS — Z0000 Encounter for general adult medical examination without abnormal findings: Secondary | ICD-10-CM

## 2020-04-21 DIAGNOSIS — Z1231 Encounter for screening mammogram for malignant neoplasm of breast: Secondary | ICD-10-CM

## 2020-04-21 DIAGNOSIS — M8580 Other specified disorders of bone density and structure, unspecified site: Secondary | ICD-10-CM

## 2020-04-21 DIAGNOSIS — Z23 Encounter for immunization: Secondary | ICD-10-CM

## 2020-04-21 DIAGNOSIS — E2839 Other primary ovarian failure: Secondary | ICD-10-CM

## 2020-04-21 NOTE — Patient Instructions (Addendum)
Lynn Mccoy , Thank you for taking time to come for your Medicare Wellness Visit. I appreciate your ongoing commitment to your health goals. Please review the following plan we discussed and let me know if I can assist you in the future.   Screening recommendations/referrals: Colonoscopy: No longer required.  Mammogram: No longer required.  Bone Density: Up to date, due 05/2020 Recommended yearly ophthalmology/optometry visit for glaucoma screening and checkup Recommended yearly dental visit for hygiene and checkup  Vaccinations: Influenza vaccine: Done 04/21/20 Pneumococcal vaccine: Completed series Tdap vaccine: Up to date, due 12/2025 Shingles vaccine: Completed series    Advanced directives: Please bring a copy of your POA (Power of Attorney) and/or Living Will to your next appointment.   Conditions/risks identified: Recommend to increase water intake to 6-8 8 oz glasses a day and to exercise for 3 days a week at least 30 mibutes at a time.   Next appointment: None, declined scheduling a follow up with PCP or an AWV for 2022 at this time.    Preventive Care 60 Years and Older, Female Preventive care refers to lifestyle choices and visits with your health care provider that can promote health and wellness. What does preventive care include?  A yearly physical exam. This is also called an annual well check.  Dental exams once or twice a year.  Routine eye exams. Ask your health care provider how often you should have your eyes checked.  Personal lifestyle choices, including:  Daily care of your teeth and gums.  Regular physical activity.  Eating a healthy diet.  Avoiding tobacco and drug use.  Limiting alcohol use.  Practicing safe sex.  Taking low-dose aspirin every day.  Taking vitamin and mineral supplements as recommended by your health care provider. What happens during an annual well check? The services and screenings done by your health care provider during  your annual well check will depend on your age, overall health, lifestyle risk factors, and family history of disease. Counseling  Your health care provider may ask you questions about your:  Alcohol use.  Tobacco use.  Drug use.  Emotional well-being.  Home and relationship well-being.  Sexual activity.  Eating habits.  History of falls.  Memory and ability to understand (cognition).  Work and work Statistician.  Reproductive health. Screening  You may have the following tests or measurements:  Height, weight, and BMI.  Blood pressure.  Lipid and cholesterol levels. These may be checked every 5 years, or more frequently if you are over 18 years old.  Skin check.  Lung cancer screening. You may have this screening every year starting at age 35 if you have a 30-pack-year history of smoking and currently smoke or have quit within the past 15 years.  Fecal occult blood test (FOBT) of the stool. You may have this test every year starting at age 49.  Flexible sigmoidoscopy or colonoscopy. You may have a sigmoidoscopy every 5 years or a colonoscopy every 10 years starting at age 47.  Hepatitis C blood test.  Hepatitis B blood test.  Sexually transmitted disease (STD) testing.  Diabetes screening. This is done by checking your blood sugar (glucose) after you have not eaten for a while (fasting). You may have this done every 1-3 years.  Bone density scan. This is done to screen for osteoporosis. You may have this done starting at age 26.  Mammogram. This may be done every 1-2 years. Talk to your health care provider about how often you should have  regular mammograms. Talk with your health care provider about your test results, treatment options, and if necessary, the need for more tests. Vaccines  Your health care provider may recommend certain vaccines, such as:  Influenza vaccine. This is recommended every year.  Tetanus, diphtheria, and acellular pertussis (Tdap,  Td) vaccine. You may need a Td booster every 10 years.  Zoster vaccine. You may need this after age 15.  Pneumococcal 13-valent conjugate (PCV13) vaccine. One dose is recommended after age 33.  Pneumococcal polysaccharide (PPSV23) vaccine. One dose is recommended after age 40. Talk to your health care provider about which screenings and vaccines you need and how often you need them. This information is not intended to replace advice given to you by your health care provider. Make sure you discuss any questions you have with your health care provider. Document Released: 07/16/2015 Document Revised: 03/08/2016 Document Reviewed: 04/20/2015 Elsevier Interactive Patient Education  2017 [place] Prevention in the Home Falls can cause injuries. They can happen to people of all ages. There are many things you can do to make your home safe and to help prevent falls. What can I do on the outside of my home?  Regularly fix the edges of walkways and driveways and fix any cracks.  Remove anything that might make you trip as you walk through a door, such as a raised step or threshold.  Trim any bushes or trees on the path to your home.  Use bright outdoor lighting.  Clear any walking paths of anything that might make someone trip, such as rocks or tools.  Regularly check to see if handrails are loose or broken. Make sure that both sides of any steps have handrails.  Any raised decks and porches should have guardrails on the edges.  Have any leaves, snow, or ice cleared regularly.  Use sand or salt on walking paths during winter.  Clean up any spills in your garage right away. This includes oil or grease spills. What can I do in the bathroom?  Use night lights.  Install grab bars by the toilet and in the tub and shower. Do not use towel bars as grab bars.  Use non-skid mats or decals in the tub or shower.  If you need to sit down in the shower, use a plastic, non-slip  stool.  Keep the floor dry. Clean up any water that spills on the floor as soon as it happens.  Remove soap buildup in the tub or shower regularly.  Attach bath mats securely with double-sided non-slip rug tape.  Do not have throw rugs and other things on the floor that can make you trip. What can I do in the bedroom?  Use night lights.  Make sure that you have a light by your bed that is easy to reach.  Do not use any sheets or blankets that are too big for your bed. They should not hang down onto the floor.  Have a firm chair that has side arms. You can use this for support while you get dressed.  Do not have throw rugs and other things on the floor that can make you trip. What can I do in the kitchen?  Clean up any spills right away.  Avoid walking on wet floors.  Keep items that you use a lot in easy-to-reach places.  If you need to reach something above you, use a strong step stool that has a grab bar.  Keep electrical cords out of the  way.  Do not use floor polish or wax that makes floors slippery. If you must use wax, use non-skid floor wax.  Do not have throw rugs and other things on the floor that can make you trip. What can I do with my stairs?  Do not leave any items on the stairs.  Make sure that there are handrails on both sides of the stairs and use them. Fix handrails that are broken or loose. Make sure that handrails are as long as the stairways.  Check any carpeting to make sure that it is firmly attached to the stairs. Fix any carpet that is loose or worn.  Avoid having throw rugs at the top or bottom of the stairs. If you do have throw rugs, attach them to the floor with carpet tape.  Make sure that you have a light switch at the top of the stairs and the bottom of the stairs. If you do not have them, ask someone to add them for you. What else can I do to help prevent falls?  Wear shoes that:  Do not have high heels.  Have rubber bottoms.  Are  comfortable and fit you well.  Are closed at the toe. Do not wear sandals.  If you use a stepladder:  Make sure that it is fully opened. Do not climb a closed stepladder.  Make sure that both sides of the stepladder are locked into place.  Ask someone to hold it for you, if possible.  Clearly mark and make sure that you can see:  Any grab bars or handrails.  First and last steps.  Where the edge of each step is.  Use tools that help you move around (mobility aids) if they are needed. These include:  Canes.  Walkers.  Scooters.  Crutches.  Turn on the lights when you go into a dark area. Replace any light bulbs as soon as they burn out.  Set up your furniture so you have a clear path. Avoid moving your furniture around.  If any of your floors are uneven, fix them.  If there are any pets around you, be aware of where they are.  Review your medicines with your doctor. Some medicines can make you feel dizzy. This can increase your chance of falling. Ask your doctor what other things that you can do to help prevent falls. This information is not intended to replace advice given to you by your health care provider. Make sure you discuss any questions you have with your health care provider. Document Released: 04/15/2009 Document Revised: 11/25/2015 Document Reviewed: 07/24/2014 Elsevier Interactive Patient Education  2017 [employer].

## 2020-04-21 NOTE — Patient Instructions (Signed)
.   Please review the attached list of medications and notify my office if there are any errors.   . Please bring all of your medications to every appointment so we can make sure that our medication list is the same as yours.   

## 2020-04-21 NOTE — Progress Notes (Signed)
I,Roshena L Chambers,acting as a scribe for [name], MD.,have documented all relevant documentation on the behalf of [name], MD,as directed by  [name], MD while in the presence of [name], MD.  Complete physical exam   Patient: Lynn Mccoy   DOB: [birth year]   79 y.o. Female  MRN: [id number] Visit Date: 04/21/2020  Today's healthcare provider: [name], MD   Chief Complaint  Patient presents with  . Annual Exam  . Depression  . Hypertension   Subjective    Lynn Mccoy is a 79 y.o. female who presents today for a complete physical exam.  She reports consuming a general diet. Home exercise routine includes walking 2-3 times weekly. She generally feels fairly well. She reports sleeping poorly. She does not have additional problems to discuss today.  HPI  Hypertension, follow-up  BP Readings from Last 3 Encounters:  04/21/19 136/64  05/22/18 128/74  04/17/18 (!) 158/68   Wt Readings from Last 3 Encounters:  04/21/19 192 lb (87.1 kg)  05/22/18 196 lb 9.6 oz (89.2 kg)  04/17/18 203 lb (92.1 kg)     She was last seen for hypertension 1 year ago.  BP at that visit was 136/64. Management since that visit includes continue same medication.  She reports good compliance with treatment. She is not having side effects.  She is following a Regular diet. She is exercising. She does not smoke.  Use of agents associated with hypertension: NSAIDS.   Outside blood pressures are not checked. Symptoms: No chest pain No chest pressure  No palpitations No syncope  No dyspnea No orthopnea  No paroxysmal nocturnal dyspnea No lower extremity edema   Pertinent labs: Lab Results  Component Value Date   CHOL 150 04/17/2018   HDL 53 04/17/2018   LDLCALC 85 04/17/2018   TRIG 58 04/17/2018   CHOLHDL 2.8 04/17/2018   Lab Results  Component Value Date   NA 140 04/21/2019   K 4.5 04/21/2019   CREATININE 0.95 04/21/2019   GFRNONAA 57 (L) 04/21/2019     GFRAA 66 04/21/2019   GLUCOSE 102 (H) 04/21/2019     The ASCVD Risk score (Goff DC Jr., et al., 2013) failed to calculate for the following reasons:   The 2013 ASCVD risk score is only valid for ages 54 to 15   ---------------------------------------------------------------------------------------------------  Depression, Follow-up  She  was last seen for this 1 year ago. Changes made at last visit include none; continue same medication.   She reports fair compliance with treatment. Patient states she only takes 1/2 tablet of Zoloft a few days each week. She is not having side effects.   She reports good tolerance of treatment. Current symptoms include: depressed mood She feels she is Unchanged since last visit.  Depression screen [facility] 2/9 04/21/2020 04/10/2019 04/08/2018  Decreased Interest 2 0 0  Down, Depressed, Hopeless 1 0 1  PHQ - 2 Score 3 0 1  Altered sleeping 2 - -  Tired, decreased energy 3 - -  Change in appetite 0 - -  Feeling bad or failure about yourself  0 - -  Trouble concentrating 0 - -  Moving slowly or fidgety/restless 0 - -  Suicidal thoughts 0 - -  PHQ-9 Score 8 - -  Difficult doing work/chores Not difficult at all - -    -----------------------------------------------------------------------------------------   Past Medical History:  Diagnosis Date  . Cancer ([place])    ovarian ca  . Depression   .  History of cervical cancer 12/29/2014   stage 4, s/p complete hysterectomy   . History of mumps as a child   . Hyperlipidemia   . Osteoarthrosis    knees  . Osteopenia   . Wears dentures    full upper and lower   Past Surgical History:  Procedure Laterality Date  . ABDOMINAL HYSTERECTOMY  1977   due to cervical cancer stage 4  . APPENDECTOMY  1977  . arthritis Bilateral   . CATARACT EXTRACTION W/PHACO Left 03/06/2018   Procedure: CATARACT EXTRACTION PHACO AND INTRAOCULAR LENS PLACEMENT ([place]) COMPLICATED LEFT;  Surgeon: Leandrew Koyanagi, MD;   Location: [facility];  Service: Ophthalmology;  Laterality: Left;  HEALON 5 VISION [place]    . REPLACEMENT TOTAL KNEE Right   . ROTATOR CUFF REPAIR Right about 2006   Dr. Sabra Heck   Social History   Socioeconomic History  . Marital status: Widowed    Spouse name: Not on file  . Number of children: 3  . Years of education: 74  . Highest education level: High school graduate  Occupational History  . Occupation: Retired    Comment: former Haematologist  Tobacco Use  . Smoking status: Never Smoker  . Smokeless tobacco: Never Used  Vaping Use  . Vaping Use: Never used  Substance and Sexual Activity  . Alcohol use: Yes    Alcohol/week: 0.0 - 1.0 standard drinks  . Drug use: No  . Sexual activity: Not Currently  Other Topics Concern  . Not on file  Social History Narrative  . Not on file   Social Determinants of Health   Financial Resource Strain:   . Difficulty of Paying Living Expenses: Not on file  Food Insecurity:   . Worried About Charity fundraiser in the Last Year: Not on file  . Ran Out of Food in the Last Year: Not on file  Transportation Needs:   . Lack of Transportation (Medical): Not on file  . Lack of Transportation (Non-Medical): Not on file  Physical Activity:   . Days of Exercise per Week: Not on file  . Minutes of Exercise per Session: Not on file  Stress:   . Feeling of Stress : Not on file  Social Connections:   . Frequency of Communication with Friends and Family: Not on file  . Frequency of Social Gatherings with Friends and Family: Not on file  . Attends Religious Services: Not on file  . Active Member of Clubs or Organizations: Not on file  . Attends Archivist Meetings: Not on file  . Marital Status: Not on file  Intimate Partner Violence:   . Fear of Current or Ex-Partner: Not on file  . Emotionally Abused: Not on file  . Physically Abused: Not on file  . Sexually Abused: Not on  file   Family Status  Relation Name Status  . Mother  Deceased       Cause of Death: Leukemia  . Father  Deceased       Cause of Death: Lung Cancer  . Sister  Deceased  . Brother  Deceased       Cause of death: MI  . Sister  Deceased  . Neg Hx  (Not Specified)   Family History  Problem Relation Age of Onset  . Leukemia Mother   . Lung cancer Father   . Dementia Sister   . Alzheimer's disease Sister   . Heart attack Brother   .  Rheum arthritis Sister   . COPD Sister   . Breast cancer Neg Hx    Allergies  Allergen Reactions  . Peanut-Containing Drug Products Swelling    (peanuts only - caused mouth swelling)    Patient Care Team: Birdie Sons, MD as PCP - General (Family Medicine) Emmaline Kluver., MD (Rheumatology)   Medications: Outpatient Medications Prior to Visit  Medication Sig  . aspirin 81 MG chewable tablet Chew 1 tablet by mouth daily.  . Brimonidine Tartrate (LUMIFY) 0.025 % SOLN Apply to eye daily.  . cholecalciferol (VITAMIN D) 1000 UNITS tablet Take 1 tablet by mouth daily.  . Cyanocobalamin (VITAMIN B 12) 100 MCG LOZG Take 1 tablet by mouth daily.  . hydrochlorothiazide (HYDRODIURIL) 25 MG tablet Take 1 tablet (25 mg total) by mouth daily.  Marland Kitchen loratadine (CLARITIN) 10 MG tablet Take 1 tablet by mouth daily.   . montelukast (SINGULAIR) 10 MG tablet Take 1 tablet (10 mg total) by mouth at bedtime.  . naproxen sodium (ANAPROX) 220 MG tablet Take 220 mg by mouth 2 (two) times daily with a meal.  . NON FORMULARY Neuriva OTC once daily  . NONFORMULARY OR COMPOUNDED ITEM SeroVital- hgh 4 tablets daily  . sertraline (ZOLOFT) 100 MG tablet TAKE ONE TABLET BY MOUTH EVERY DAY (Patient taking differently: Take 50 mg by mouth daily. )  . traZODone (DESYREL) 100 MG tablet TAKE ONE-HALF TO ONE TABLET ONE HOUR BEFORE BEDTIME.   No facility-administered medications prior to visit.    Review of Systems  Constitutional: Positive for fatigue. Negative for  chills and fever.  HENT: Positive for drooling. Negative for congestion, ear pain, rhinorrhea, sneezing and sore throat.   Eyes: Negative.  Negative for pain and redness.  Respiratory: Negative for cough, shortness of breath and wheezing.   Cardiovascular: Negative for chest pain and leg swelling.  Gastrointestinal: Negative for abdominal pain, blood in stool, constipation, diarrhea and nausea.  Endocrine: Negative for polydipsia and polyphagia.  Genitourinary: Negative.  Negative for dysuria, flank pain, hematuria, pelvic pain, vaginal bleeding and vaginal discharge.  Musculoskeletal: Positive for gait problem. Negative for arthralgias, back pain and joint swelling.  Skin: Negative for rash.  Neurological: Positive for numbness (in hands). Negative for dizziness, tremors, seizures, weakness, light-headedness and headaches.  Hematological: Negative for adenopathy.  Psychiatric/Behavioral: Negative.  Negative for behavioral problems, confusion and dysphoric mood. The patient is not nervous/anxious and is not hyperactive.       Objective    BP 132/72 (BP Location: Left Arm, Patient Position: Sitting, Cuff Size: Large)   Pulse 67   Temp 98.4 F (36.9 C) (Oral)   Resp 16   Ht 5\' 7"  (1.702 m)   Wt 182 lb 12.8 oz (82.9 kg)   BMI 28.63 kg/m    Physical Exam   General Appearance:     Overweight female. Alert, cooperative, in no acute distress, appears stated age   Head:    Normocephalic, without obvious abnormality, atraumatic  Eyes:    PERRL, conjunctiva/corneas clear, EOM's intact, fundi    benign, both eyes  Ears:    Normal TM's and external ear canals, both ears  Nose:   Nares normal, septum midline, mucosa normal, no drainage    or sinus tenderness  Throat:   Lips, mucosa, and tongue normal; teeth and gums normal  Neck:   Supple, symmetrical, trachea midline, no adenopathy;    thyroid:  no enlargement/tenderness/nodules; no carotid   bruit or JVD  Back:  Symmetric, no  curvature, ROM normal, no CVA tenderness  Lungs:     Clear to auscultation bilaterally, respirations unlabored  Chest Wall:    No tenderness or deformity   Heart:    Normal heart rate. Normal rhythm. No murmurs, rubs, or gallops.   Breast Exam:    normal appearance, no masses or tenderness  Abdomen:     Soft, non-tender, bowel sounds active all four quadrants,    no masses, no organomegaly  Pelvic:    deferred and not indicated; status post hysterectomy, negative ROS  Extremities:   All extremities are intact. No cyanosis or edema  Pulses:   2+ and symmetric all extremities  Skin:   Skin color, texture, turgor normal, no rashes or lesions  Lymph nodes:   Cervical, supraclavicular, and axillary nodes normal  Neurologic:   CNII-XII intact, normal strength, sensation and reflexes    throughout    Last depression screening scores PHQ 2/9 Scores 04/21/2020 04/10/2019 04/08/2018  PHQ - 2 Score 3 0 1  PHQ- 9 Score 8 - -   Last fall risk screening Fall Risk  04/21/2020  Falls in the past year? 0  Number falls in past yr: 0  Injury with Fall? 0  Risk for fall due to : -  Risk for fall due to: Comment -  Follow up Falls evaluation completed   Last Audit-C alcohol use screening Alcohol Use Disorder Test (AUDIT) 05/22/2018  1. How often do you have a drink containing alcohol? 2  2. How many drinks containing alcohol do you have on a typical day when you are drinking? 0  3. How often do you have six or more drinks on one occasion? 0  AUDIT-C Score 2   A score of 3 or more in women, and 4 or more in men indicates increased risk for alcohol abuse, EXCEPT if all of the points are from question 1   No results found for any visits on 04/21/20.  Assessment & Plan    Routine Health Maintenance and Physical Exam  Exercise Activities and Dietary recommendations Goals    . Exercise 3x per week (30 min per time)     Recommend to walk 3x per week (30 min per time).    . Increase water intake       Recommend drinking 6-8 glasses of water per day. Due to foul odor with voiding, would also recommend adding Cranberry juice as well.       Immunization History  Administered Date(s) Administered  . Fluad Quad(high Dose 65+) 04/21/2019, 04/21/2020  . Influenza, High Dose Seasonal PF 04/11/2017, 04/08/2018  . PFIZER SARS-COV-2 Vaccination 08/06/2019, 08/27/2019, 04/12/2020  . Pneumococcal Conjugate-13 03/12/2014  . Pneumococcal Polysaccharide-23 03/22/2005  . Td 01/19/1997, 11/23/2008  . Tdap 12/11/2015  . Zoster 10/28/2007  . Zoster Recombinat (Shingrix) 05/29/2018, 08/16/2018    Health Maintenance  Topic Date Due  . DEXA SCAN  05/22/2020  . TETANUS/TDAP  12/10/2025  . INFLUENZA VACCINE  Completed  . COVID-19 Vaccine  Completed  . PNA vac Low Risk Adult  Completed    Discussed health benefits of physical activity, and encouraged her to engage in regular exercise appropriate for her age and condition.  1. Annual physical exam   2. Need for influenza vaccination  - Flu Vaccine QUAD High Dose IM (Fluad)  3. Essential hypertension Well controlled.  Continue current medications.   - Comprehensive metabolic panel - CBC  4. Osteopenia, unspecified location  - DG Bone Density;  Future  5. Estrogen deficiency  - DG Bone Density; Future      The entirety of the information documented in the History of Present Illness, Review of Systems and Physical Exam were personally obtained by me. Portions of this information were initially documented by the CMA and reviewed by me for thoroughness and accuracy.      [name], MD  [facility] [phone] (phone) [phone] (fax)  [place]

## 2020-04-22 LAB — CBC
Hematocrit: 38.1 % (ref 34.0–46.6)
Hemoglobin: 12 g/dL (ref 11.1–15.9)
MCH: 28.4 pg (ref 26.6–33.0)
MCHC: 31.5 g/dL (ref 31.5–35.7)
MCV: 90 fL (ref 79–97)
Platelets: 283 10*3/uL (ref 150–450)
RBC: 4.23 x10E6/uL (ref 3.77–5.28)
RDW: 13.4 % (ref 11.7–15.4)
WBC: 7.2 10*3/uL (ref 3.4–10.8)

## 2020-04-22 LAB — COMPREHENSIVE METABOLIC PANEL
ALT: 15 IU/L (ref 0–32)
AST: 20 IU/L (ref 0–40)
Albumin/Globulin Ratio: 1.7 (ref 1.2–2.2)
Albumin: 4.4 g/dL (ref 3.7–4.7)
Alkaline Phosphatase: 76 IU/L (ref 44–121)
BUN/Creatinine Ratio: 18 (ref 12–28)
BUN: 17 mg/dL (ref 8–27)
Bilirubin Total: 0.2 mg/dL (ref 0.0–1.2)
CO2: 24 mmol/L (ref 20–29)
Calcium: 9.6 mg/dL (ref 8.7–10.3)
Chloride: 105 mmol/L (ref 96–106)
Creatinine, Ser: 0.95 mg/dL (ref 0.57–1.00)
GFR calc Af Amer: 65 mL/min/{1.73_m2} (ref >59)
GFR calc non Af Amer: 57 mL/min/{1.73_m2} — ABNORMAL LOW (ref >59)
Globulin, Total: 2.6 g/dL (ref 1.5–4.5)
Glucose: 98 mg/dL (ref 65–99)
Potassium: 4 mmol/L (ref 3.5–5.2)
Sodium: 143 mmol/L (ref 134–144)
Total Protein: 7 g/dL (ref 6.0–8.5)

## 2020-04-26 ENCOUNTER — Other Ambulatory Visit: Payer: Self-pay | Admitting: Family Medicine

## 2020-04-26 DIAGNOSIS — F4321 Adjustment disorder with depressed mood: Secondary | ICD-10-CM

## 2020-04-26 NOTE — Telephone Encounter (Signed)
Requested Prescriptions  Pending Prescriptions Disp Refills  . sertraline (ZOLOFT) 100 MG tablet [Pharmacy Med Name: SERTRALINE HCL 100 MG TABLET] 90 tablet 1    Sig: TAKE ONE TABLET EVERY DAY     Psychiatry:  Antidepressants - SSRI Passed - 04/26/2020  9:07 AM      Passed - Completed PHQ-2 or PHQ-9 in the last 360 days.      Passed - Valid encounter within last 6 months    Recent Outpatient Visits          5 days ago Need for influenza vaccination   [facility] Birdie Sons, MD   5 months ago [place], Vickki Muff, [place]   1 year ago Need for influenza vaccination   [facility] Birdie Sons, MD   1 year ago Essential hypertension   [facility] Birdie Sons, MD   2 years ago Annual physical exam   [facility] Birdie Sons, MD

## 2020-05-31 ENCOUNTER — Other Ambulatory Visit: Payer: Self-pay

## 2020-05-31 ENCOUNTER — Ambulatory Visit
Admission: RE | Admit: 2020-05-31 | Discharge: 2020-05-31 | Disposition: A | Discharge status: Home / Self Care | Payer: Medicare Other | Source: Ambulatory Visit | Attending: Family Medicine | Admitting: Family Medicine

## 2020-05-31 DIAGNOSIS — E2839 Other primary ovarian failure: Secondary | ICD-10-CM | POA: Insufficient documentation

## 2020-05-31 DIAGNOSIS — M8580 Other specified disorders of bone density and structure, unspecified site: Secondary | ICD-10-CM | POA: Diagnosis not present

## 2020-05-31 DIAGNOSIS — Z1231 Encounter for screening mammogram for malignant neoplasm of breast: Secondary | ICD-10-CM

## 2020-05-31 DIAGNOSIS — M85852 Other specified disorders of bone density and structure, left thigh: Secondary | ICD-10-CM | POA: Diagnosis not present

## 2020-05-31 DIAGNOSIS — Z87828 Personal history of other (healed) physical injury and trauma: Secondary | ICD-10-CM | POA: Diagnosis not present

## 2020-05-31 DIAGNOSIS — R29890 Loss of height: Secondary | ICD-10-CM | POA: Diagnosis not present

## 2020-06-29 ENCOUNTER — Other Ambulatory Visit: Payer: Self-pay | Admitting: Family Medicine

## 2020-08-02 ENCOUNTER — Other Ambulatory Visit: Payer: Self-pay | Admitting: Family Medicine

## 2020-08-02 DIAGNOSIS — I10 Essential (primary) hypertension: Secondary | ICD-10-CM

## 2020-09-16 IMAGING — MG DIGITAL SCREENING BILATERAL MAMMOGRAM WITH TOMO AND CAD
8 series · 8 of 24 positions shown · non-contrast
Comparison: Previous exam(s).

CLINICAL DATA: Screening.

EXAM:
DIGITAL SCREENING BILATERAL MAMMOGRAM WITH TOMO AND CAD

[R CC synth-2D]
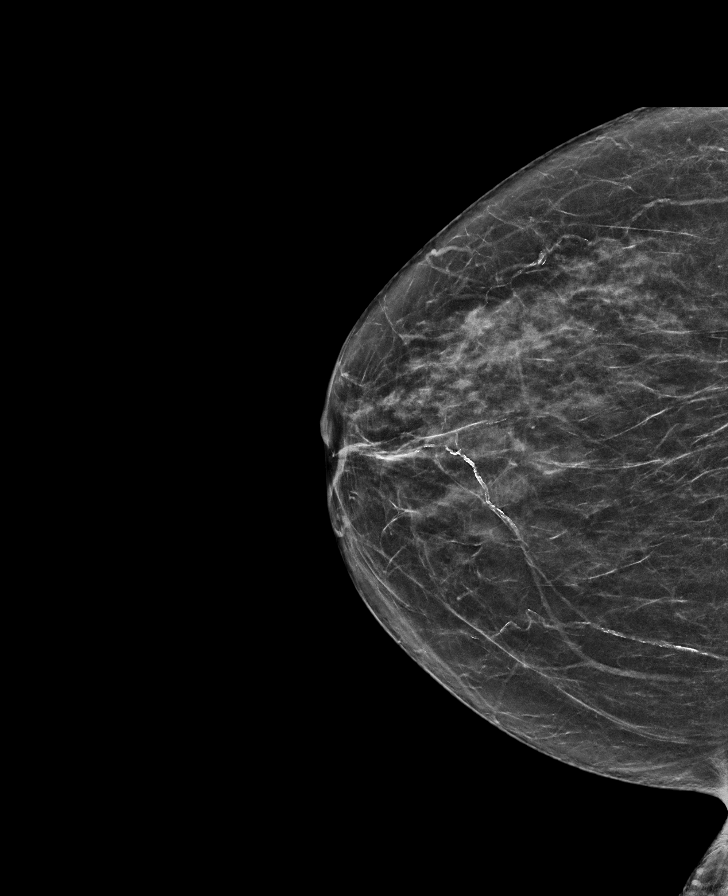

[L MLO synth-2D]
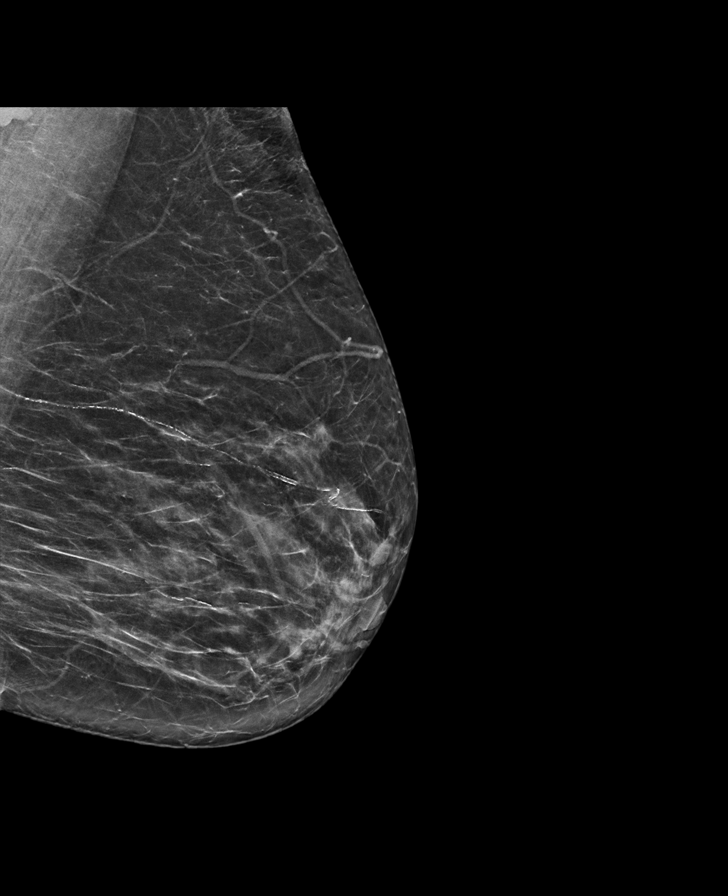

[R MLO synth-2D]
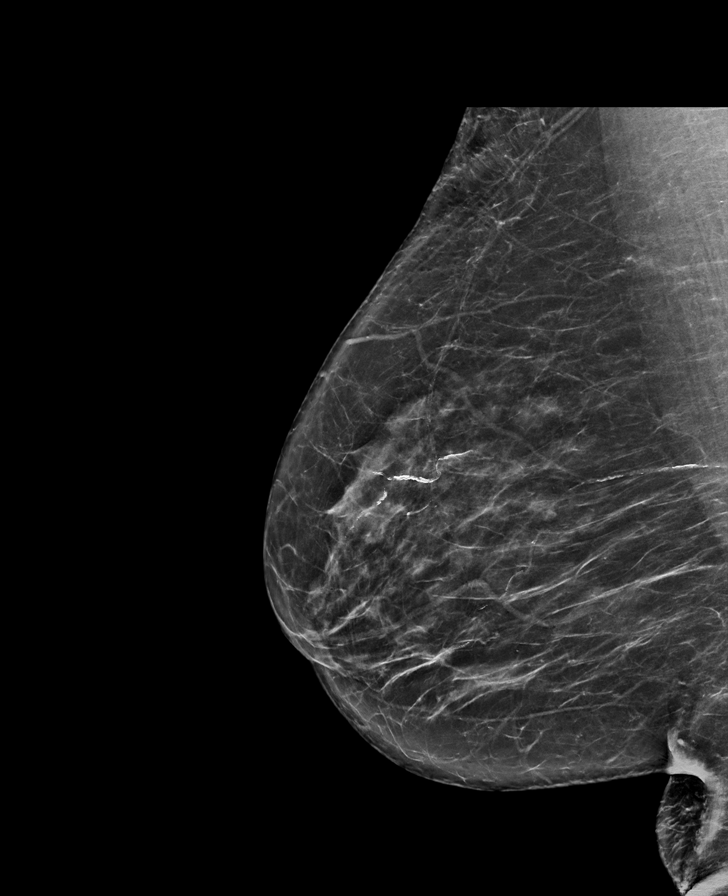

[L CC synth-2D]
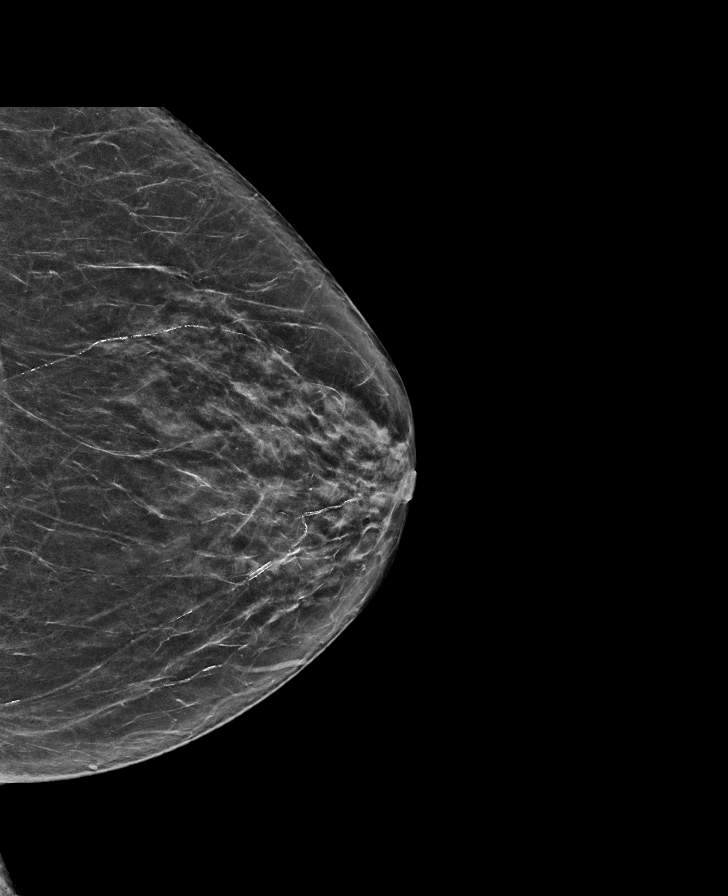

[R MLO tomo · tomo slice 37/73.0]
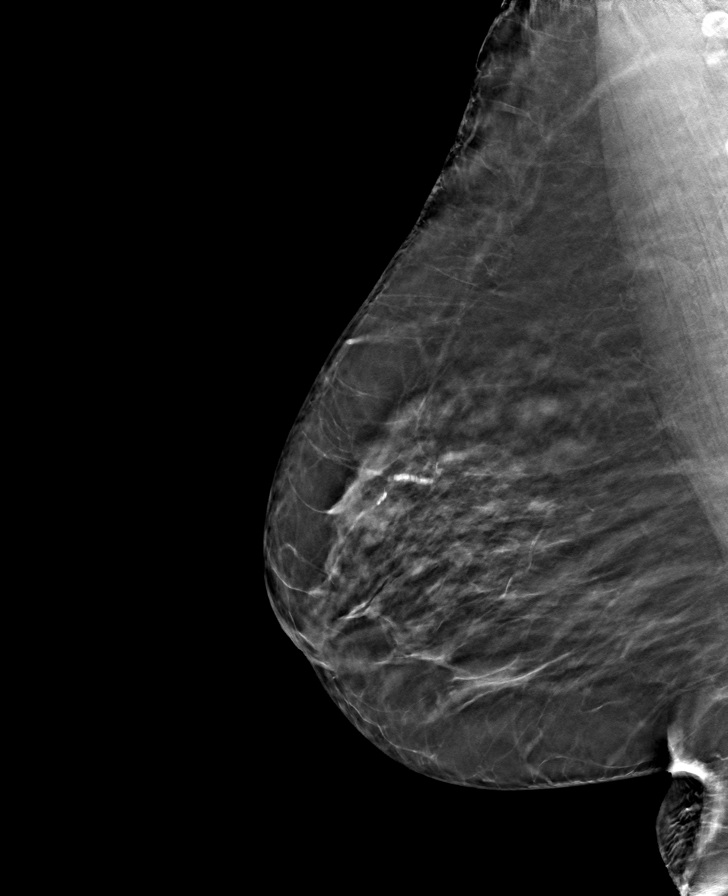

[L CC tomo · tomo slice 33/66.0]
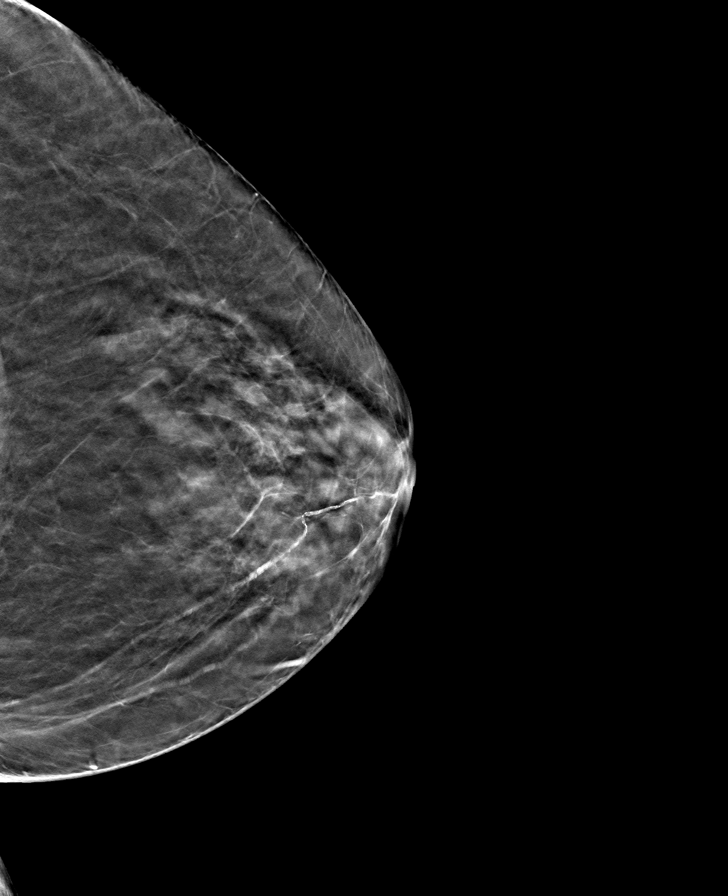

[L MLO tomo · tomo slice 35/69.0]
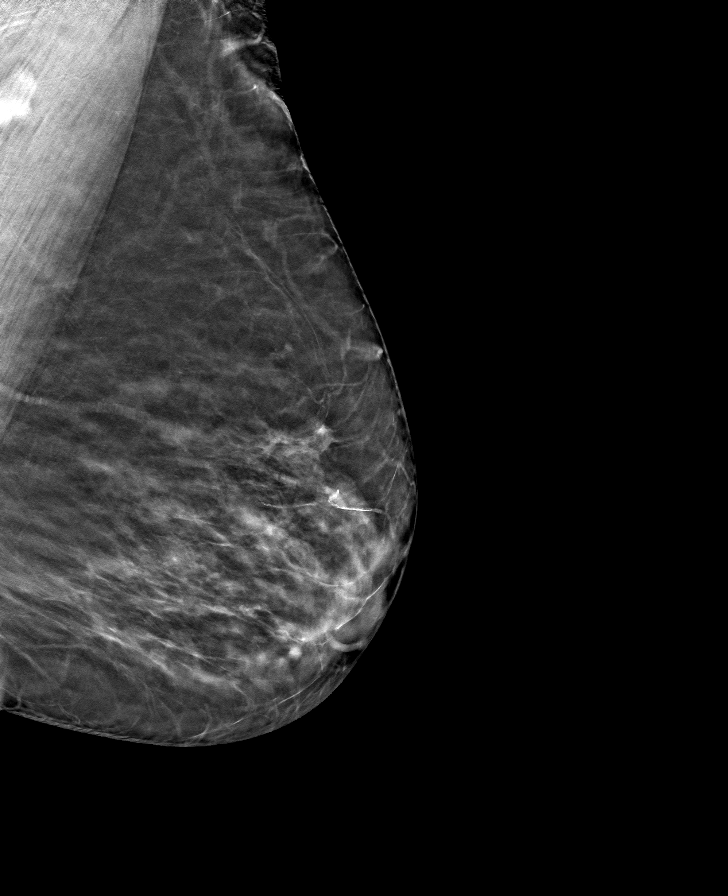

[R CC tomo · tomo slice 31/61.0]
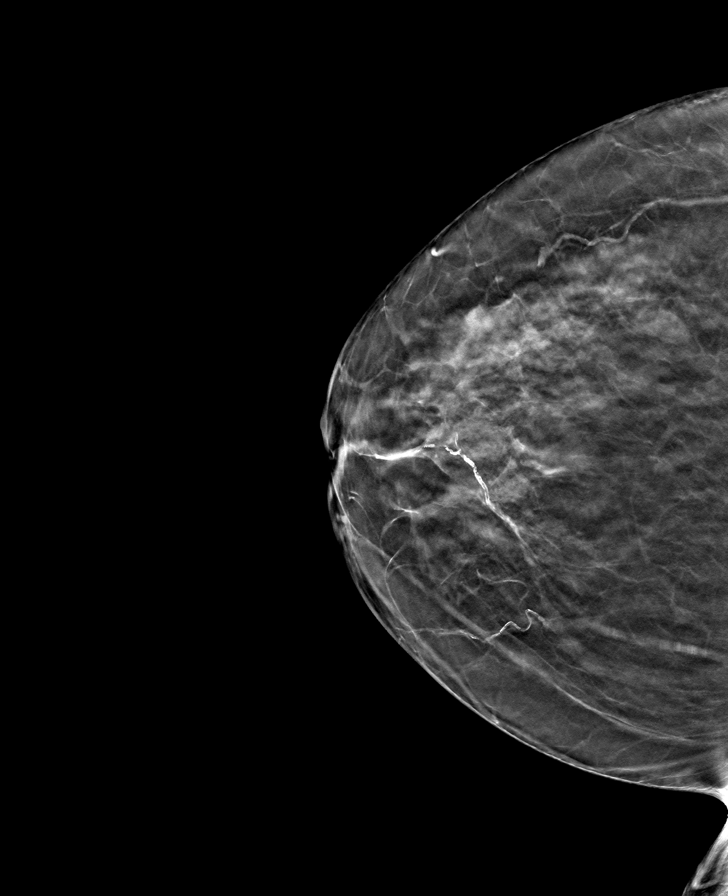

[8 of 24 positions shown; findings below may reference images not displayed]

ACR Breast Density Category b: There are scattered areas of
fibroglandular density.
FINDINGS: There are no findings suspicious for malignancy. Images were
processed with CAD.
IMPRESSION: No mammographic evidence of malignancy. A result letter of this
screening mammogram will be mailed directly to the patient.

RECOMMENDATION:
Screening mammogram in one year. (Code:[id number])

BI-RADS CATEGORY  1: Negative.

## 2020-09-24 ENCOUNTER — Other Ambulatory Visit: Payer: Self-pay | Admitting: Family Medicine

## 2020-09-24 DIAGNOSIS — G4700 Insomnia, unspecified: Secondary | ICD-10-CM

## 2020-10-25 ENCOUNTER — Other Ambulatory Visit: Payer: Self-pay | Admitting: Family Medicine

## 2020-10-25 DIAGNOSIS — G4700 Insomnia, unspecified: Secondary | ICD-10-CM

## 2020-11-22 ENCOUNTER — Other Ambulatory Visit: Payer: Self-pay | Admitting: Family Medicine

## 2020-11-22 DIAGNOSIS — G4700 Insomnia, unspecified: Secondary | ICD-10-CM

## 2020-11-22 DIAGNOSIS — F4321 Adjustment disorder with depressed mood: Secondary | ICD-10-CM

## 2020-12-06 DIAGNOSIS — M25562 Pain in left knee: Secondary | ICD-10-CM | POA: Diagnosis not present

## 2020-12-06 DIAGNOSIS — G8929 Other chronic pain: Secondary | ICD-10-CM | POA: Diagnosis not present

## 2021-02-07 ENCOUNTER — Telehealth: Payer: Self-pay

## 2021-02-07 ENCOUNTER — Ambulatory Visit: Payer: Self-pay | Admitting: *Deleted

## 2021-02-07 MED ORDER — TRIAMCINOLONE ACETONIDE 0.5 % EX CREA: 1.0000 "application " | TOPICAL_CREAM | Freq: Three times a day (TID) | CUTANEOUS | 0 refills | Status: DC

## 2021-02-07 NOTE — Telephone Encounter (Signed)
See separate message. Prescription sent into pharmacy.

## 2021-02-07 NOTE — Telephone Encounter (Signed)
Per agent: "Pt called stating that she has poison ivy. She states that it is on both arms, under her breast, and a small spot on her face that she noticed beside her nose. Pt states that she is looking for further care advice as she has been treating this for a week. Please advise. "  Pt reports poison ivy rash "Helping my daughter in the yard, she has it too." Reports noted Wednesday 02/02/21. Has been treating with Hydrocortisone cream. States rash is from both elbows, scattered down to hands, small patch left side of face, "Cheek near nose" states dime size. Reports red and itchy under breasts "No bumps yet." States rash is moist, not oozing. Itching, hydrocortisone cream effective "Itchy when wears off."  No availability per protocol timeframe, (On face, 24 hrs.) Home care advise given. Assured pt NT would route to practice for PCPs review and final disposition. Advised UC for worsening, spreading areas on face. Please advise:    Reason for Disposition  [1] Face or genitals are involved AND [2] more than a small rash  Answer Assessment - Initial Assessment Questions 1. APPEARANCE of RASH: "Describe the rash."      Red, some blistered, moist not oozing 2. LOCATION: "Where is the rash located?"  (e.g., face, genitals, hands, legs)     At elbows both arms, down to hands, left side of face, under breasts 3. SIZE: "How large is the rash?"      Little blisters 4. ONSET: "When did the rash begin?"      Last wednesday 5. ITCHING: "Does the rash itch?" If Yes, ask: "How bad is it?"   - MILD - doesn't interfere with normal activities   - MODERATE-SEVERE: interferes with work, school, sleep, or other activities      Without cortisone, severe 6. EXPOSURE:  "How were you exposed to the plant (poison ivy, poison oak, sumac)"  "When were you exposed?"       yes 7. PAST HISTORY: "Have you had a poison ivy rash before?" If Yes, ask: "How bad was it?"  Protocols used: [place] - [place]-A-AH

## 2021-02-07 NOTE — Telephone Encounter (Addendum)
Have sent prescription for triamcinolone to [facility].

## 2021-02-07 NOTE — Telephone Encounter (Signed)
Copied from [place] [phone]. Topic: Appointment Scheduling - Scheduling Inquiry for Clinic >> Feb 07, 2021  3:24 PM Loma Boston wrote: Reason for CRM:Pt wanting to be seen tomorrow as has poison oak/ not seen since 10/21 FU at [phone] FU other [phone]

## 2021-02-07 NOTE — Telephone Encounter (Signed)
Patient advised.

## 2021-04-25 ENCOUNTER — Ambulatory Visit (INDEPENDENT_AMBULATORY_CARE_PROVIDER_SITE_OTHER): Payer: Medicare Other | Admitting: Family Medicine

## 2021-04-25 ENCOUNTER — Other Ambulatory Visit: Payer: Self-pay

## 2021-04-25 ENCOUNTER — Encounter: Payer: Self-pay | Admitting: Family Medicine

## 2021-04-25 VITALS — BP 142/62 | HR 69 | Temp 97.7°F | Ht 67.0 in | Wt 170.0 lb

## 2021-04-25 DIAGNOSIS — Z0000 Encounter for general adult medical examination without abnormal findings: Secondary | ICD-10-CM

## 2021-04-25 DIAGNOSIS — F3289 Other specified depressive episodes: Secondary | ICD-10-CM

## 2021-04-25 DIAGNOSIS — I10 Essential (primary) hypertension: Secondary | ICD-10-CM

## 2021-04-25 DIAGNOSIS — M436 Torticollis: Secondary | ICD-10-CM

## 2021-04-25 DIAGNOSIS — Z23 Encounter for immunization: Secondary | ICD-10-CM

## 2021-04-25 MED ORDER — TIZANIDINE HCL 4 MG PO TABS: 4.0000 mg | ORAL_TABLET | Freq: Every day | ORAL | 1 refills | Status: DC

## 2021-04-25 NOTE — Patient Instructions (Signed)
.   Please review the attached list of medications and notify my office if there are any errors.   . Please bring all of your medications to every appointment so we can make sure that our medication list is the same as yours.   

## 2021-04-25 NOTE — Progress Notes (Signed)
Complete Physical Exam     Patient: Lynn Mccoy, Female    DOB: Mar 04, 1941, 80 y.o.   MRN: [id number] Visit Date: 04/25/2021  Today's Provider: [name], MD   Chief Complaint  Patient presents with   Annual Exam   Lynn Mccoy is a 80 y.o. female who presents today for her complete physical examination . She reports consuming a general diet.  Exercises regularly  She generally feels well. She reports sleeping well. She does not have additional problems to discuss today.   She also complains of having very stiff neck when she turns her head from side to side. It is worse in the morning and gets a little better as day goes by. No radicular sx. Not stiff to flex neck.     Medications: Outpatient Medications Prior to Visit  Medication Sig   aspirin 81 MG chewable tablet Chew 1 tablet by mouth daily.   Brimonidine Tartrate (LUMIFY) 0.025 % SOLN Apply to eye daily.   cholecalciferol (VITAMIN D) 1000 UNITS tablet Take 1 tablet by mouth daily.   Cyanocobalamin (VITAMIN B 12) 100 MCG LOZG Take 1 tablet by mouth daily.   hydrochlorothiazide (HYDRODIURIL) 25 MG tablet Take 1 tablet (25 mg total) by mouth daily.   loratadine (CLARITIN) 10 MG tablet Take 1 tablet by mouth daily.    montelukast (SINGULAIR) 10 MG tablet Take 1 tablet (10 mg total) by mouth at bedtime.   naproxen sodium (ANAPROX) 220 MG tablet Take 220 mg by mouth 2 (two) times daily with a meal.   NON FORMULARY Neuriva OTC once daily   NONFORMULARY OR COMPOUNDED ITEM SeroVital- hgh 4 tablets daily   sertraline (ZOLOFT) 100 MG tablet TAKE ONE TABLET EVERY DAY   traZODone (DESYREL) 100 MG tablet TAKE ONE-HALF TO ONE TABLET ONE HOUR BEFORE BEDTIME.   triamcinolone cream (KENALOG) 0.5 % Apply 1 application topically 3 (three) times daily.   No facility-administered medications prior to visit.    Allergies  Allergen Reactions   Peanut-Containing Drug Products Swelling    (peanuts only - caused  mouth swelling)    Patient Care Team: Birdie Sons, MD as PCP - General (Family Medicine) Emmaline Kluver., MD (Rheumatology) Leandrew Koyanagi, MD as Referring Physician (Ophthalmology)  Review of Systems  Constitutional: Negative.   HENT:  Positive for drooling. Negative for congestion, dental problem, ear discharge, ear pain, facial swelling, hearing loss, mouth sores, nosebleeds, postnasal drip, rhinorrhea, sinus pressure, sinus pain, sneezing, sore throat, tinnitus, trouble swallowing and voice change.   Eyes: Negative.   Respiratory: Negative.    Cardiovascular: Negative.   Gastrointestinal: Negative.   Endocrine: Positive for polyuria. Negative for cold intolerance, heat intolerance, polydipsia and polyphagia.  Genitourinary: Negative.   Musculoskeletal:  Positive for arthralgias and neck pain. Negative for back pain, gait problem, joint swelling, myalgias and neck stiffness.  Skin: Negative.   Allergic/Immunologic: Negative.   Neurological: Negative.   Hematological: Negative.   Psychiatric/Behavioral:  Positive for dysphoric mood. Negative for agitation, behavioral problems, confusion, decreased concentration, hallucinations, self-injury, sleep disturbance and suicidal ideas. The patient is not nervous/anxious and is not hyperactive.        Objective    Vitals: BP (!) 153/63 (BP Location: Right Arm, Patient Position: Sitting, Cuff Size: Normal)   Pulse 69   Temp 97.7 F (36.5 C) (Oral)   Ht 5\' 7"  (1.702 m)   Wt 170 lb (77.1 kg)   SpO2 99%  BMI 26.63 kg/m    Physical Exam   General Appearance:     Well developed, well nourished female. Alert, cooperative, in no acute distress, appears stated age   Head:    Normocephalic, without obvious abnormality, atraumatic  Eyes:    PERRL, conjunctiva/corneas clear, EOM's intact, fundi    benign, both eyes  Ears:    Normal TM's and external ear canals, both ears  Neck:   Supple, symmetrical, trachea midline,  no adenopathy;    thyroid:  no enlargement/tenderness/nodules; no carotid   bruit or JVD  Back:     Symmetric, no curvature, ROM normal, no CVA tenderness  Lungs:     Clear to auscultation bilaterally, respirations unlabored  Chest Wall:    No tenderness or deformity   Heart:    Normal heart rate. Normal rhythm. No murmurs, rubs, or gallops.   Breast Exam:    deferred  Abdomen:     Soft, non-tender, bowel sounds active all four quadrants,    no masses, no organomegaly  Pelvic:    deferred  Extremities:   All extremities are intact. No cyanosis or edema  Pulses:   2+ and symmetric all extremities  Skin:   Skin color, texture, turgor normal, no rashes or lesions  Lymph nodes:   Cervical, supraclavicular, and axillary nodes normal  Neurologic:   CNII-XII intact, normal strength, sensation and reflexes    throughout     Assessment & Plan       1. Annual physical exam   2. Stiff neck try tiZANidine (ZANAFLEX) 4 MG tablet; Take 1 tablet (4 mg total) by mouth at bedtime. For stiff neck  Dispense: 30 tablet; Refill: 1  3. Other depression Doing well with sertraline, continue current regiment.   4. Essential hypertension Well controlled.  Continue current medications.   - Comprehensive metabolic panel - CBC - TSH  5. Need for influenza vaccination  - Flu Vaccine QUAD High Dose IM (Fluad)     [name], MD  [facility] [phone] (phone) [phone] (fax)  [place]

## 2021-04-25 NOTE — Progress Notes (Signed)
Annual Wellness Visit     Patient: Lynn Mccoy, Female    DOB: [birth year], 80 y.o.   MRN: [id number] Visit Date: 04/25/2021  Today's Provider: [name], MD    [place] is a 80 y.o. female who presents today for her Annual Wellness Visit.  Medications: Outpatient Medications Prior to Visit  Medication Sig   aspirin 81 MG chewable tablet Chew 1 tablet by mouth daily.   Brimonidine Tartrate 0.025 % SOLN Apply to eye daily.   cholecalciferol (VITAMIN D) 1000 UNITS tablet Take 1 tablet by mouth daily.   Cyanocobalamin (VITAMIN B 12) 100 MCG LOZG Take 1 tablet by mouth daily.   hydrochlorothiazide (HYDRODIURIL) 25 MG tablet Take 1 tablet (25 mg total) by mouth daily.   loratadine (CLARITIN) 10 MG tablet Take 1 tablet by mouth daily.    montelukast (SINGULAIR) 10 MG tablet Take 1 tablet (10 mg total) by mouth at bedtime.   naproxen sodium (ANAPROX) 220 MG tablet Take 220 mg by mouth 2 (two) times daily with a meal.   NON FORMULARY Neuriva OTC once daily   NONFORMULARY OR COMPOUNDED ITEM SeroVital- hgh 4 tablets daily   sertraline (ZOLOFT) 100 MG tablet TAKE ONE TABLET EVERY DAY   traZODone (DESYREL) 100 MG tablet TAKE ONE-HALF TO ONE TABLET ONE HOUR BEFORE BEDTIME.   triamcinolone cream (KENALOG) 0.5 % Apply 1 application topically 3 (three) times daily.   No facility-administered medications prior to visit.    Allergies  Allergen Reactions   Peanut-Containing Drug Products Swelling    (peanuts only - caused mouth swelling)    Patient Care Team: Birdie Sons, MD as PCP - General (Family Medicine) Emmaline Kluver., MD (Rheumatology) Leandrew Koyanagi, MD as Referring Physician (Ophthalmology)        Objective     Most recent functional status assessment: In your present state of health, do you have any difficulty performing the following activities: 04/25/2021  Hearing? N  Vision? N  Difficulty concentrating or making  decisions? N  Walking or climbing stairs? Y  Dressing or bathing? N  Doing errands, shopping? N  Some recent data might be hidden   Most recent fall risk assessment: Fall Risk  04/25/2021  Falls in the past year? 0  Number falls in past yr: 0  Injury with Fall? 0  Risk for fall due to : No Fall Risks  Risk for fall due to: Comment -  Follow up Falls evaluation completed    Most recent depression screenings: PHQ 2/9 Scores 04/25/2021 04/21/2020  PHQ - 2 Score 1 3  PHQ- 9 Score 4 8   Most recent cognitive screening: 6CIT Screen 04/25/2021  What Year? 0 points  What month? 0 points  What time? 0 points  Count back from 20 0 points  Months in reverse 0 points  Repeat phrase 2 points  Total Score 2   Most recent Audit-C alcohol use screening Alcohol Use Disorder Test (AUDIT) 04/25/2021  1. How often do you have a drink containing alcohol? 1  2. How many drinks containing alcohol do you have on a typical day when you are drinking? 0  3. How often do you have six or more drinks on one occasion? 0  AUDIT-C Score 1   A score of 3 or more in women, and 4 or more in men indicates increased risk for alcohol abuse, EXCEPT if all of the points are from question 1  No results found for any visits on 04/25/21.  Assessment & Plan     Annual wellness visit done today including the all of the following: Reviewed patient's Family Medical History Reviewed and updated list of patient's medical providers Assessment of cognitive impairment was done Assessed patient's functional ability Established a written schedule for health screening services Health Risk Assessent Completed and Reviewed  Exercise Activities and Dietary recommendations  Goals      Exercise 3x per week (30 min per time)     Recommend to walk 3x per week (30 min per time).     Increase water intake     Recommend drinking 6-8 glasses of water per day.         Immunization History  Administered Date(s)  Administered   Fluad Quad(high Dose 65+) 04/21/2019, 04/21/2020, 04/25/2021   Influenza, High Dose Seasonal PF 04/11/2017, 04/08/2018   PFIZER(Purple Top)SARS-COV-2 Vaccination 08/06/2019, 08/27/2019, 04/12/2020, 02/14/2021   Pneumococcal Conjugate-13 03/12/2014   Pneumococcal Polysaccharide-23 03/22/2005   Td 01/19/1997, 11/23/2008   Tdap 12/11/2015   Zoster Recombinat (Shingrix) 05/29/2018, 08/16/2018   Zoster, Live 10/28/2007    Health Maintenance  Topic Date Due   COVID-19 Vaccine (5 - Booster for Pfizer series) 04/11/2021   DEXA SCAN  05/31/2025   TETANUS/TDAP  12/10/2025   Pneumonia Vaccine 54+ Years old  Completed   INFLUENZA VACCINE  Completed   Zoster Vaccines- Shingrix  Completed   HPV VACCINES  Aged Out     Discussed health benefits of physical activity, and encouraged her to engage in regular exercise appropriate for her age and condition.          The entirety of the information documented in the History of Present Illness, Review of Systems and Physical Exam were personally obtained by me. Portions of this information were initially documented by the CMA and reviewed by me for thoroughness and accuracy.     [name], MD  [facility] [phone] (phone) [phone] (fax)  [place]

## 2021-04-26 ENCOUNTER — Other Ambulatory Visit: Payer: Self-pay | Admitting: Family Medicine

## 2021-04-26 DIAGNOSIS — Z1231 Encounter for screening mammogram for malignant neoplasm of breast: Secondary | ICD-10-CM

## 2021-04-26 LAB — CBC
Hematocrit: 35.1 % (ref 34.0–46.6)
Hemoglobin: 11.4 g/dL (ref 11.1–15.9)
MCH: 29.2 pg (ref 26.6–33.0)
MCHC: 32.5 g/dL (ref 31.5–35.7)
MCV: 90 fL (ref 79–97)
Platelets: 246 x10E3/uL (ref 150–450)
RBC: 3.9 x10E6/uL (ref 3.77–5.28)
RDW: 13.6 % (ref 11.7–15.4)
WBC: 7 x10E3/uL (ref 3.4–10.8)

## 2021-04-26 LAB — COMPREHENSIVE METABOLIC PANEL WITH GFR
ALT: 17 IU/L (ref 0–32)
AST: 23 IU/L (ref 0–40)
Albumin/Globulin Ratio: 1.8 (ref 1.2–2.2)
Albumin: 4.4 g/dL (ref 3.6–4.6)
Alkaline Phosphatase: 73 IU/L (ref 44–121)
BUN/Creatinine Ratio: 24 (ref 12–28)
BUN: 21 mg/dL (ref 8–27)
Bilirubin Total: 0.3 mg/dL (ref 0.0–1.2)
CO2: 22 mmol/L (ref 20–29)
Calcium: 9.9 mg/dL (ref 8.7–10.3)
Chloride: 102 mmol/L (ref 96–106)
Creatinine, Ser: 0.88 mg/dL (ref 0.57–1.00)
Globulin, Total: 2.4 g/dL (ref 1.5–4.5)
Glucose: 104 mg/dL — ABNORMAL HIGH (ref 70–99)
Potassium: 3.6 mmol/L (ref 3.5–5.2)
Sodium: 140 mmol/L (ref 134–144)
Total Protein: 6.8 g/dL (ref 6.0–8.5)
eGFR: 66 mL/min/1.73

## 2021-04-26 LAB — TSH: TSH: 1.08 u[IU]/mL (ref 0.450–4.500)

## 2021-05-10 ENCOUNTER — Other Ambulatory Visit: Payer: Self-pay | Admitting: Family Medicine

## 2021-05-10 DIAGNOSIS — G4700 Insomnia, unspecified: Secondary | ICD-10-CM

## 2021-06-01 ENCOUNTER — Ambulatory Visit
Admission: RE | Admit: 2021-06-01 | Discharge: 2021-06-01 | Disposition: A | Discharge status: Home / Self Care | Payer: Medicare Other | Source: Ambulatory Visit | Attending: Family Medicine | Admitting: Family Medicine

## 2021-06-01 ENCOUNTER — Other Ambulatory Visit: Payer: Self-pay

## 2021-06-01 DIAGNOSIS — Z1231 Encounter for screening mammogram for malignant neoplasm of breast: Secondary | ICD-10-CM | POA: Insufficient documentation

## 2021-08-29 ENCOUNTER — Encounter: Payer: Self-pay | Admitting: Family Medicine

## 2021-08-29 ENCOUNTER — Other Ambulatory Visit: Payer: Self-pay

## 2021-08-29 ENCOUNTER — Ambulatory Visit (INDEPENDENT_AMBULATORY_CARE_PROVIDER_SITE_OTHER): Payer: Medicare Other | Admitting: Family Medicine

## 2021-08-29 VITALS — BP 151/57 | HR 68 | Temp 97.7°F | Resp 16 | Wt 168.0 lb

## 2021-08-29 DIAGNOSIS — I10 Essential (primary) hypertension: Secondary | ICD-10-CM | POA: Diagnosis not present

## 2021-08-29 MED ORDER — AMLODIPINE BESYLATE 2.5 MG PO TABS: 2.5000 mg | ORAL_TABLET | Freq: Every day | ORAL | 1 refills | Status: DC

## 2021-08-29 NOTE — Patient Instructions (Signed)
.   Please review the attached list of medications and notify my office if there are any errors.   . Please bring all of your medications to every appointment so we can make sure that our medication list is the same as yours.   

## 2021-08-29 NOTE — Progress Notes (Signed)
I,Roshena L Chambers,acting as a scribe for [name], MD.,have documented all relevant documentation on the behalf of [name], MD,as directed by  [name], MD while in the presence of [name], MD.    Established patient visit   Patient: Lynn Mccoy   DOB: 17-Aug-1940   81 y.o. Female  MRN: [id number] Visit Date: 08/29/2021  Today's healthcare provider: [name], MD   Chief Complaint  Patient presents with   Hypertension   Depression   Subjective    HPI  Hypertension, follow-up  BP Readings from Last 3 Encounters:  08/29/21 (!) 151/57  04/25/21 (!) 142/62  04/21/20 132/72   Wt Readings from Last 3 Encounters:  08/29/21 168 lb (76.2 kg)  04/25/21 170 lb (77.1 kg)  04/21/20 182 lb 12.8 oz (82.9 kg)     She was last seen for hypertension 4 months ago.  BP at that visit was 142/62. Management since that visit includes continue same medication.  She reports good compliance with treatment. She is not having side effects.  She is following a Regular diet. She is exercising. She does not smoke.  Use of agents associated with hypertension: NSAIDS.   Outside blood pressures are 133/69. Symptoms: No chest pain No chest pressure  No palpitations No syncope  No dyspnea No orthopnea  No paroxysmal nocturnal dyspnea No lower extremity edema   Pertinent labs: Lab Results  Component Value Date   CHOL 150 04/17/2018   HDL 53 04/17/2018   LDLCALC 85 04/17/2018   TRIG 58 04/17/2018   CHOLHDL 2.8 04/17/2018   Lab Results  Component Value Date   NA 140 04/25/2021   K 3.6 04/25/2021   CREATININE 0.88 04/25/2021   EGFR 66 04/25/2021   GLUCOSE 104 (H) 04/25/2021   TSH 1.080 04/25/2021     The ASCVD Risk score (Arnett DK, et al., 2019) failed to calculate for the following reasons:   The 2019 ASCVD risk score is only valid for ages 21 to 25   ---------------------------------------------------------------------------------------------------   Depression, Follow-up  She  was last seen for this 4 months ago. Changes made at last visit include none; continue same .   She reports good compliance with treatment. She is not having side effects.   She reports good tolerance of treatment.  She feels she is Unchanged since last visit.  Depression screen [facility] 2/9 08/29/2021 04/25/2021 04/21/2020  Decreased Interest 0 0 2  Down, Depressed, Hopeless 1 1 1   PHQ - 2 Score 1 1 3   Altered sleeping 0 0 2  Tired, decreased energy 1 2 3   Change in appetite 1 0 0  Feeling bad or failure about yourself  0 1 0  Trouble concentrating 0 0 0  Moving slowly or fidgety/restless 1 0 0  Suicidal thoughts 0 0 0  PHQ-9 Score 4 4 8   Difficult doing work/chores Not difficult at all Not difficult at all Not difficult at all    -----------------------------------------------------------------------------------------    Medications: Outpatient Medications Prior to Visit  Medication Sig   aspirin 81 MG chewable tablet Chew 1 tablet by mouth daily.   Brimonidine Tartrate 0.025 % SOLN Apply to eye daily.   cholecalciferol (VITAMIN D) 1000 UNITS tablet Take 1 tablet by mouth daily.   Cyanocobalamin (VITAMIN B 12) 100 MCG LOZG Take 1 tablet by mouth daily.   hydrochlorothiazide (HYDRODIURIL) 25 MG tablet Take 1 tablet (25 mg total) by mouth daily.   loratadine (CLARITIN) 10 MG tablet  Take 1 tablet by mouth daily.    montelukast (SINGULAIR) 10 MG tablet Take 1 tablet (10 mg total) by mouth at bedtime.   naproxen sodium (ANAPROX) 220 MG tablet Take 220 mg by mouth 2 (two) times daily with a meal.   NON FORMULARY Neuriva OTC once daily   NONFORMULARY OR COMPOUNDED ITEM SeroVital- hgh 4 tablets daily   sertraline (ZOLOFT) 100 MG tablet TAKE ONE TABLET EVERY DAY   tiZANidine (ZANAFLEX) 4 MG tablet Take 1 tablet (4 mg total) by mouth at bedtime. For stiff neck   traZODone (DESYREL) 100 MG tablet TAKE ONE-HALF TO ONE TABLET ONE HOUR BEFORE BEDTIME.    triamcinolone cream (KENALOG) 0.5 % Apply 1 application topically 3 (three) times daily.   No facility-administered medications prior to visit.    Review of Systems  Constitutional:  Negative for appetite change, chills, fatigue and fever.  Respiratory:  Negative for chest tightness.   Gastrointestinal:  Negative for abdominal pain, nausea and vomiting.  Musculoskeletal:  Positive for arthralgias and joint swelling.  Neurological:  Negative for dizziness and weakness.      Objective    BP (!) 151/57 (BP Location: Left Arm, Patient Position: Sitting, Cuff Size: Large)    Pulse 68    Temp 97.7 F (36.5 C) (Oral)    Resp 16    Wt 168 lb (76.2 kg)    SpO2 97% Comment: room air   BMI 26.31 kg/m   Today's Vitals   08/29/21 0829 08/29/21 0835  BP: (!) 155/51 (!) 151/57  Pulse: 68   Resp: 16   Temp: 97.7 F (36.5 C)   TempSrc: Oral   SpO2: 97%   Weight: 168 lb (76.2 kg)    Body mass index is 26.31 kg/m.   Physical Exam   General: Appearance:     Well developed, well nourished female in no acute distress  Eyes:    PERRL, conjunctiva/corneas clear, EOM's intact       Lungs:     Clear to auscultation bilaterally, respirations unlabored  Heart:    Normal heart rate. Normal rhythm. No murmurs, rubs, or gallops.    MS:   All extremities are intact.    Neurologic:   Awake, alert, oriented x 3. No apparent focal neurological defect.         Assessment & Plan     1. Essential hypertension Uncontrolled. add amLODipine (NORVASC) 2.5 MG tablet; Take 1 tablet (2.5 mg total) by mouth daily.  Dispense: 90 tablet; Refill: 1     Future Appointments  Date Time Provider [place]  11/23/2021  8:20 AM Birdie Sons, MD [facility]        The entirety of the information documented in the History of Present Illness, Review of Systems and Physical Exam were personally obtained by me. Portions of this information were initially documented by the CMA and reviewed by me for  thoroughness and accuracy.     [name], MD  [facility] [phone] (phone) [phone] (fax)  [place]

## 2021-09-07 ENCOUNTER — Ambulatory Visit: Payer: Self-pay

## 2021-09-07 NOTE — Telephone Encounter (Signed)
? ?  Chief Complaint: BP yesterday 101/60   Today 88/50 with dizziness ?Symptoms: Above ?Frequency: Started yesterday ?Pertinent Negatives: Patient denies chest pain ?Disposition: '[]'$ ED /'[]'$ Urgent Care (no appt availability in office) / '[]'$ Appointment(In office/virtual)/ '[]'$  [facility]/ '[]'$ Home Care/ '[]'$ Refused Recommended Disposition /'[]'$ [facility] [facility]/ '[]'$  Follow-up with PCP ?Additional Notes: Daughter asking if pt. Should stop medication. Please advise daughter.  ? ?Answer Assessment - Initial Assessment Questions ?1. BLOOD PRESSURE: "What is the blood pressure?" "Did you take at least two measurements 5 minutes apart?" ?    88/50       101/60 yesterday ?2. ONSET: "When did you take your blood pressure?" ?    This morning ?3. HOW: "How did you obtain the blood pressure?" (e.g., visiting nurse, automatic home BP monitor) ?    Home cuff      ?4. HISTORY: "Do you have a history of low blood pressure?" "What is your blood pressure normally?" ?    No ?5. MEDICATIONS: "Are you taking any medications for blood pressure?" If Yes, ask: "Have they been changed recently?" ?    Added amlodipine ?6. PULSE RATE: "Do you know what your pulse rate is?"  ?    Unsure ?7. OTHER SYMPTOMS: "Have you been sick recently?" "Have you had a recent injury?" ?    No ?8. PREGNANCY: "Is there any chance you are pregnant?" "When was your last menstrual period?" ?    No ? ?Protocols used: Blood Pressure - Low-A-AH ? ?

## 2021-09-07 NOTE — Telephone Encounter (Signed)
Patient advised and verbalized understanding 

## 2021-09-07 NOTE — Telephone Encounter (Signed)
Have her stop the hctz, but continue amlodipine. If she hasn't taken amlodipine today then skip it until tomorrow.  ?

## 2021-09-08 DIAGNOSIS — M1712 Unilateral primary osteoarthritis, left knee: Secondary | ICD-10-CM | POA: Diagnosis not present

## 2021-10-30 NOTE — Discharge Instructions (Signed)
Instructions after Total Knee Replacement   Lynn Mccoy P. Gayland Nicol, Jr., M.D.     Dept. of Orthopaedics & Sports Medicine  [facility]  [address]  [place], [place]  [address]  Phone: [phone]   Fax: [phone]    DIET: Drink plenty of non-alcoholic fluids. Resume your normal diet. Include foods high in fiber.  ACTIVITY:  You may use crutches or a walker with weight-bearing as tolerated, unless instructed otherwise. You may be weaned off of the walker or crutches by your Physical Therapist.  Do NOT place pillows under the knee. Anything placed under the knee could limit your ability to straighten the knee.   Continue doing gentle exercises. Exercising will reduce the pain and swelling, increase motion, and prevent muscle weakness.   Please continue to use the TED compression stockings for 6 weeks. You may remove the stockings at night, but should reapply them in the morning. Do not drive or operate any equipment until instructed.  WOUND CARE:  Continue to use the PolarCare or ice packs periodically to reduce pain and swelling. You may bathe or shower after the staples are removed at the first office visit following surgery.  MEDICATIONS: You may resume your regular medications. Please take the pain medication as prescribed on the medication. Do not take pain medication on an empty stomach. You have been given a prescription for a blood thinner (Lovenox or Coumadin). Please take the medication as instructed. (NOTE: After completing a 2 week course of Lovenox, take one Enteric-coated aspirin once a day. This along with elevation will help reduce the possibility of phlebitis in your operated leg.) Do not drive or drink alcoholic beverages when taking pain medications.  CALL THE OFFICE FOR: Temperature above 101 degrees Excessive bleeding or drainage on the dressing. Excessive swelling, coldness, or paleness of the toes. Persistent nausea and vomiting.  FOLLOW-UP:  You  should have an appointment to return to the office in 10-14 days after surgery. Arrangements have been made for continuation of Physical Therapy (either home therapy or outpatient therapy).   [facility] Department Directory         [email or web address]       [email or web address]          Cardiology  Appointments: [place] - [phone] [place] - [phone]  Endocrinology  Appointments: [place] - [phone] [place] - [phone]  Gastroenterology  Appointments: [place] - [phone] [place] - [phone]        General Surgery   Appointments: [place] - [phone]  Internal Medicine/Family Medicine  Appointments: [place] - [phone] [place] - [phone] [place] - [phone]  Metabolic and Weigh Loss Surgery  Appointments: [place] - [phone]        Neurology  Appointments: [place] - [phone] [place] - [phone]  Neurosurgery  Appointments: [place] - [phone]  Obstetrics & Gynecology  Appointments: [place] - [phone] [place] - [phone]        Pediatrics  Appointments: [place] - [phone] [place] - [phone]  Physiatry  Appointments: [place] -[phone]  Physical Therapy  Appointments: [place] - [phone] [place] - [phone]        Podiatry  Appointments: [place] - [phone] [place] - [phone]  Pulmonology  Appointments: [place] - [phone]  Rheumatology  Appointments: [place] - [phone]        [place] Location: [facility]  [address] , [place]  [address]  [place] Location: [facility] [address] [place], [place]  [address]  [place] Location: [facility] [address] [place],   [address]    

## 2021-11-03 ENCOUNTER — Other Ambulatory Visit: Payer: Self-pay

## 2021-11-03 ENCOUNTER — Encounter
Admission: RE | Admit: 2021-11-03 | Discharge: 2021-11-03 | Disposition: A | Discharge status: Home / Self Care | Payer: Medicare Other | Source: Ambulatory Visit | Attending: Orthopedic Surgery | Admitting: Orthopedic Surgery

## 2021-11-03 DIAGNOSIS — Z01818 Encounter for other preprocedural examination: Secondary | ICD-10-CM | POA: Insufficient documentation

## 2021-11-03 DIAGNOSIS — R5383 Other fatigue: Secondary | ICD-10-CM | POA: Diagnosis not present

## 2021-11-03 DIAGNOSIS — M1712 Unilateral primary osteoarthritis, left knee: Secondary | ICD-10-CM | POA: Insufficient documentation

## 2021-11-03 DIAGNOSIS — Z01810 Encounter for preprocedural cardiovascular examination: Secondary | ICD-10-CM | POA: Diagnosis not present

## 2021-11-03 DIAGNOSIS — Z01812 Encounter for preprocedural laboratory examination: Secondary | ICD-10-CM

## 2021-11-03 HISTORY — DX: Anemia, unspecified: D64.9

## 2021-11-03 LAB — COMPREHENSIVE METABOLIC PANEL
ALT: 17 U/L (ref 0–44)
AST: 20 U/L (ref 15–41)
Albumin: 4.1 g/dL (ref 3.5–5.0)
Alkaline Phosphatase: 61 U/L (ref 38–126)
Anion gap: 9 (ref 5–15)
BUN: 22 mg/dL (ref 8–23)
CO2: 25 mmol/L (ref 22–32)
Calcium: 9.2 mg/dL (ref 8.9–10.3)
Chloride: 104 mmol/L (ref 98–111)
Creatinine, Ser: 0.86 mg/dL (ref 0.44–1.00)
GFR, Estimated: >60 mL/min (ref >60)
Glucose, Bld: 103 mg/dL — ABNORMAL HIGH (ref 70–99)
Potassium: 3.8 mmol/L (ref 3.5–5.1)
Sodium: 138 mmol/L (ref 135–145)
Total Bilirubin: 0.5 mg/dL (ref 0.3–1.2)
Total Protein: 7.1 g/dL (ref 6.5–8.1)

## 2021-11-03 LAB — URINALYSIS, ROUTINE W REFLEX MICROSCOPIC
Bilirubin Urine: NEGATIVE
Glucose, UA: NEGATIVE mg/dL
Hgb urine dipstick: NEGATIVE
Ketones, ur: NEGATIVE mg/dL
Nitrite: POSITIVE — AB
Protein, ur: NEGATIVE mg/dL
Specific Gravity, Urine: 1.028 (ref 1.005–1.030)
pH: 5 (ref 5.0–8.0)

## 2021-11-03 LAB — CBC
HCT: 34.8 % — ABNORMAL LOW (ref 36.0–46.0)
Hemoglobin: 11.1 g/dL — ABNORMAL LOW (ref 12.0–15.0)
MCH: 28.5 pg (ref 26.0–34.0)
MCHC: 31.9 g/dL (ref 30.0–36.0)
MCV: 89.5 fL (ref 80.0–100.0)
Platelets: 259 10*3/uL (ref 150–400)
RBC: 3.89 MIL/uL (ref 3.87–5.11)
RDW: 13.3 % (ref 11.5–15.5)
WBC: 5.3 10*3/uL (ref 4.0–10.5)
nRBC: 0 % (ref 0.0–0.2)

## 2021-11-03 LAB — SURGICAL PCR SCREEN
MRSA, PCR: NEGATIVE
Staphylococcus aureus: POSITIVE — AB

## 2021-11-03 LAB — SEDIMENTATION RATE: Sed Rate: 41 mm/hr — ABNORMAL HIGH (ref 0–30)

## 2021-11-03 LAB — C-REACTIVE PROTEIN: CRP: 0.6 mg/dL (ref <1.0)

## 2021-11-03 NOTE — Patient Instructions (Addendum)
Your procedure is scheduled on: 11/14/21 - Monday ?Report to the Registration Desk on the 1st floor of the [facility]. ?To find out your arrival time, please call [phone] between 1PM - 3PM on: 11/11/21 - Friday ?If your arrival time is 6:00 am, do not arrive prior to that time as the [facility] entrance doors do not open until 6:00 am. ? ?REMEMBER: ?Instructions that are not followed completely may result in serious medical risk, up to and including death; or upon the discretion of your surgeon and anesthesiologist your surgery may need to be rescheduled. ? ?Do not eat food after midnight the night before surgery.  ?No gum chewing, lozengers or hard candies. ? ?You may however, drink CLEAR liquids up to 2 hours before you are scheduled to arrive for your surgery. Do not drink anything within 2 hours of your scheduled arrival time. ? ?Clear liquids include: ?- water  ?- apple juice without pulp ?- gatorade (not RED colors) ?- black coffee or tea (Do NOT add milk or creamers to the coffee or tea) ?Do NOT drink anything that is not on this list. ? ?In addition, your doctor has ordered for you to drink the provided  ?Ensure Pre-Surgery Clear Carbohydrate Drink  ?Drinking this carbohydrate drink up to two hours before surgery helps to reduce insulin resistance and improve patient outcomes. Please complete drinking 2 hours prior to scheduled arrival time. ? ?TAKE THESE MEDICATIONS THE MORNING OF SURGERY WITH A SIP OF WATER: ?- amLODipine (NORVASC) 2.5 MG tablet ? ?One week prior to surgery: Beginning 11/07/21  ?Stop Anti-inflammatories (NSAIDS) such as Advil, Aleve, Ibuprofen, Motrin, Naproxen, Naprosyn and Aspirin based products such as Excedrin, Goodys Powder, BC Powder. ? ?Stop ANY OVER THE COUNTER supplements beginning 11/07/21 until after surgery. cholecalciferol (VITAMIN D) 1000 UNITS tablet, Neuriva , SeroVital- hgh. diclofenac Sodium (VOLTAREN) 1 % GEL. ? ?You may take Tylenol if needed for pain up  until the day of surgery. ? ?No Alcohol for 24 hours before or after surgery. ? ?No Smoking including e-cigarettes for 24 hours prior to surgery.  ?No chewable tobacco products for at least 6 hours prior to surgery.  ?No nicotine patches on the day of surgery. ? ?Do not use any "recreational" drugs for at least a week prior to your surgery.  ?Please be advised that the combination of cocaine and anesthesia may have negative outcomes, up to and including death. ?If you test positive for cocaine, your surgery will be cancelled. ? ?On the morning of surgery brush your teeth with toothpaste and water, you may rinse your mouth with mouthwash if you wish. ?Do not swallow any toothpaste or mouthwash. ? ?Use CHG Soap or wipes as directed on instruction sheet. ? ?Do not wear jewelry, make-up, hairpins, clips or nail polish. ? ?Do not wear lotions, powders, or perfumes.  ? ?Do not shave body from the neck down 48 hours prior to surgery just in case you cut yourself which could leave a site for infection.  ?Also, freshly shaved skin may become irritated if using the CHG soap. ? ?Contact lenses, hearing aids and dentures may not be worn into surgery. ? ?Do not bring valuables to the hospital. [facility] is not responsible for any missing/lost belongings or valuables.  ? ?Notify your doctor if there is any change in your medical condition (cold, fever, infection). ? ?Wear comfortable clothing (specific to your surgery type) to the hospital. ? ?After surgery, you can help prevent lung complications by doing  breathing exercises.  ?Take deep breaths and cough every 1-2 hours. Your doctor may order a device called an Incentive Spirometer to help you take deep breaths. ?When coughing or sneezing, hold a pillow firmly against your incision with both hands. This is called ?splinting.? Doing this helps protect your incision. It also decreases belly discomfort. ? ?If you are being admitted to the hospital overnight, leave your suitcase  in the car. ?After surgery it may be brought to your room. ? ?If you are being discharged the day of surgery, you will not be allowed to drive home. ?You will need a responsible adult (18 years or older) to drive you home and stay with you that night.  ? ?If you are taking public transportation, you will need to have a responsible adult (18 years or older) with you. ?Please confirm with your physician that it is acceptable to use public transportation.  ? ?Please call the [facility] Dept. at [phone] if you have any questions about these instructions. ? ?Surgery Visitation Policy: ? ?Patients undergoing a surgery or procedure may have two family members or support persons with them as long as the person is not COVID-19 positive or experiencing its symptoms.  ? ?Inpatient Visitation:   ? ?Visiting hours are 7 a.m. to 8 p.m. ?Up to four visitors are allowed at one time in a patient room, including children. The visitors may rotate out with other people during the day. One designated support person (adult) may remain overnight.  ?

## 2021-11-05 NOTE — Progress Notes (Signed)
?  [facility] ?Perioperative Services: Pre-Admission/Anesthesia Testing ? ?Abnormal Lab Notification ?  ?Date: 11/05/21 ? ?Name: Lynn Mccoy ?MRN:   [id number] ? ?Re: Abnormal labs noted during PAT appointment  ? ?Notified:  ?Provider Name Provider Role Notification Mode  ?Skip Estimable, MD Orthopedics (Surgeon) Routed and/or faxed via [facility]  ? ?Abnormal Lab Value(s):  ? ?Lab Results  ?Component Value Date  ? COLORURINE YELLOW (A) 11/03/2021  ? APPEARANCEUR HAZY (A) 11/03/2021  ? LABSPEC 1.028 11/03/2021  ? PHURINE 5.0 11/03/2021  ? GLUCOSEU NEGATIVE 11/03/2021  ? [place] NEGATIVE 11/03/2021  ? [place] NEGATIVE 11/03/2021  ? [place] NEGATIVE 11/03/2021  ? PROTEINUR NEGATIVE 11/03/2021  ? NITRITE POSITIVE (A) 11/03/2021  ? LEUKOCYTESUR LARGE (A) 11/03/2021  ? EPIU 0-5 11/03/2021  ? WBCU 21-50 11/03/2021  ? RBCU 0-5 11/03/2021  ? BACTERIA MANY (A) 11/03/2021  ? CULT >=100,000 COLONIES/mL ESCHERICHIA COLI (A) 11/03/2021  ? ? ?Clinical Information and Notes:  ?Patient is scheduled for COMPUTER ASSISTED TOTAL KNEE ARTHROPLASTY (Left: Knee) on 11/14/2021.   ? ?UA performed in PAT consistent with/concerning for infection.  ?No leukocytosis noted on CBC; WBC 5300 ?Renal function: BUN 22 and creatinine 0.86 mg/dL ?Urine C&S added to assess for pathogenically significant growth.  ? ?Impression and Plan:  ?Lynn Mccoy with a UA that was (+) for infection; reflex culture sent. Preliminary culture (+) for significant Escherichia coli colony count; final susceptibilities pending. Will plan on forwarding final culture results to MD as they become available to me. Sending results for review and consideration of preoperative treatment as deemed appropriate by Dr. Marry Guan.  ? 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 No formal response is required. ? ?Honor Loh, MSN, APRN, FNP-C, CEN ?[place]  ?Peri-operative Services Nurse Practitioner ?Phone: [phone] ?Fax: [phone] ?11/05/21  2:31 PM ? ?NOTE: This note has been prepared using [employer]. Despite my best ability to proofread, there is always the potential that unintentional transcriptional errors may still occur from this process. ? ?

## 2021-11-06 LAB — URINE CULTURE: Culture: >=100000 — AB

## 2021-11-08 DIAGNOSIS — M1712 Unilateral primary osteoarthritis, left knee: Secondary | ICD-10-CM | POA: Diagnosis not present

## 2021-11-13 NOTE — H&P (Signed)
ORTHOPAEDIC HISTORY & PHYSICAL ?Tamala Julian [place], [place] - 11/08/2021 2:45 PM EDT ?Formatting of this note is different from the original. ?[place] [facility] ?Chief Complaint:  ? ?Chief Complaint  ?Patient presents with  ? Knee Pain  ?H & P LEFT KNEE  ? ?History of Present Illness:  ? ?Lynn Mccoy is a 81 y.o. female that presents to clinic today for her preoperative history and evaluation. The patient is scheduled to undergo a left total knee arthroplasty on 11/14/21 by Dr. Marry Guan. Her pain began many years ago. The pain is located primarily along the lateral aspect of the knee. She describes her pain as worse with weightbearing. She reports associated swelling with significant giving way of the knee. She denies associated numbness or tingling, denies locking.  ? ?The patient's symptoms have progressed to the point that they decrease her quality of life. The patient has previously undergone conservative treatment including NSAIDS and injections to the knee without adequate control of her symptoms. ? ?Denies history of lumbar surgery, DVT, or significant cardiac history.  ? ?Patient will have her daughter and granddaughter to help afterwards.  ? ?Pre-op labs show UTI with E. coli.  ? ?Past Medical, Surgical, Family, Social History, Allergies, Medications:  ? ?Past Medical History:  ?Past Medical History:  ?Diagnosis Date  ? Adjustment disorder with depressed mood 01/13/2009  ? Allergic rhinitis 01/22/2015  ? Diverticulosis of colon without hemorrhage 10/29/2008  ? Fatigue 12/29/2014  ? Insomnia 12/06/2009  ? Osteoarthritis  ? Osteopenia 11/24/2008  ?Hip on BMD 2012  ? ?Past Surgical History:  ?Past Surgical History:  ?Procedure Laterality Date  ? COLONOSCOPY 01/11/2004  ?Hyperplastic Polyps  ? COLONOSCOPY 04/06/2014  ?Int Hemorrhoids, Diverticulosis: No repeat due to age per RTE (dw)  ? Right total knee arthroplasty, robot assisted Makoplasty 08/10/2016  ?Dr Vilinda Flake  ?  ARTHROSCOPIC ROTATOR CUFF REPAIR Right  ? VAGINAL HYSTERECTOMY  ? ?Current Medications:  ?Current Outpatient Medications  ?Medication Sig Dispense Refill  ? amLODIPine (NORVASC) 2.5 MG tablet Take 1 tablet by mouth once daily  ? aspirin 81 MG chewable tablet Take 81 mg by mouth once daily.  ? cetirizine (ZYRTEC) 10 MG tablet Take 10 mg by mouth once daily  ? cholecalciferol (VITAMIN D3) 1000 unit tablet Take 1,000 Units by mouth once daily  ? diclofenac (VOLTAREN) 1 % topical gel Apply 2 g topically 2 (two) times daily  ? montelukast (SINGULAIR) 10 mg tablet TAKE ONE TABLET BY MOUTH EVERY NIGHT AT BEDTIME  ? naproxen sodium (ALEVE, ANAPROX) 220 MG tablet Take 440 mg by mouth 2 (two) times daily as needed  ? NON FORMULARY Take 1 tablet by mouth once daily Neuriva  ? NON FORMULARY Take 4 tablets by mouth at bedtime Serovital Hgh  ? traZODone (DESYREL) 100 MG tablet 1/2 or 1 tablet 1 hour before bedtime  ? sulfamethoxazole-trimethoprim (BACTRIM DS) 800-160 mg tablet Take 1 tablet (160 mg of trimethoprim total) by mouth 2 (two) times daily for 3 days 6 tablet 0  ? ?No current facility-administered medications for this visit.  ? ?Allergies: No Known Allergies ? ?Social History:  ?Social History  ? ?Socioeconomic History  ? Marital status: Widowed  ? Number of children: 3  ? Years of education: 20  ? Highest education level: High school graduate  ?Occupational History  ? Occupation: Retired- hairdresser  ?Tobacco Use  ? Smoking status: Never  ? Smokeless tobacco: Never  ?Vaping Use  ? Vaping  Use: Never used  ?Substance and Sexual Activity  ? Alcohol use: Yes  ?Comment: rarely  ? Drug use: No  ? Sexual activity: Defer  ?Partners: Male  ? ?Family History:  ?Family History  ?Problem Relation Age of Onset  ? Arthritis Sister  ? ?Review of Systems:  ? ?A 10+ ROS was performed, reviewed, and the pertinent orthopaedic findings are documented in the HPI.  ? ?Physical Examination:  ? ?BP (!) 140/80 (BP Location: Left upper arm,  Patient Position: Sitting, BP Cuff Size: Adult)  Ht 170.2 cm ('5\' 7"'$ )  Wt 75.2 kg (165 lb 12.8 oz)  BMI 25.97 kg/m?  ? ?Patient is a well-developed, well-nourished female in no acute distress. Patient has normal mood and affect. Patient is alert and oriented to person, place, and time.  ? ?HEENT: Atraumatic, normocephalic. Pupils equal and reactive to light. Extraocular motion intact. Noninjected sclera. ? ?Cardiovascular: Regular rate and rhythm, with no murmurs, rubs, or gallops. Distal pulses palpable. No carotid bruits.  ? ?Respiratory: Lungs clear to auscultation bilaterally.  ? ?  ?Left Knee: ?Soft tissue swelling: minimal ?Effusion: none ?Erythema: none ?Crepitance: mild ?Tenderness: lateral ?Alignment: relative valgus ?Mediolateral laxity: lateral pseudolaxity ?Posterior sag: negative ?Patellar tracking: Good tracking without evidence of subluxation or tilt ?Atrophy: No significant atrophy.  ?Quadriceps tone was fair to good. ?Range of motion: 0/2/115 degrees ? ?Plantarflex and dorsiflex ankle. Able to flex and extend the toes. ? ?Sensation intact over the saphenous, lateral sural cutaneous, superficial fibular, and deep fibular nerve distributions. ? ?Tests Performed/Reviewed:  ?X-rays ? ?Anteroposterior, lateral, and sunrise views of the left knee were reviewed. Images reveal severe loss of lateral compartment joint space with bone-on-bone contact, subchondral sclerosis, and significant osteophyte formation. No fractures or osseous abnormalities noted. ? ?Impression:  ? ?ICD-10-CM  ?1. Primary osteoarthritis of left knee M17.12  ? ?Plan:  ? ?The patient has end-stage degenerative changes of the left knee. It was explained to the patient that the condition is progressive in nature. Having failed conservative treatment, the patient has elected to proceed with a total joint arthroplasty. The patient will undergo a total joint arthroplasty with Dr. Marry Guan. The risks of surgery, including blood clot and  infection, were discussed with the patient. Measures to reduce these risks, including the use of anticoagulation, perioperative antibiotics, and early ambulation were discussed. The importance of postoperative physical therapy was discussed with the patient. The patient elects to proceed with surgery. The patient is instructed to stop all blood thinners prior to surgery. The patient is instructed to call the hospital the day before surgery to learn of the proper arrival time. Rx sent in for Bactrim to treat UTI noted on preop labs. ? ?Contact our office with any questions or concerns. Follow up as indicated, or sooner should any new problems arise, if conditions worsen, or if they are otherwise concerned.  ? ?Gwenlyn Fudge, PA -C ?[facility] ?[address] ?[place], [place] [address] ?Phone: [phone] ? ?This note was generated in part with voice recognition software and I apologize for any typographical errors that were not detected and corrected. ?Electronically signed by Gwenlyn Fudge, PA at 11/10/2021 5:34 PM EDT ? ?

## 2021-11-14 ENCOUNTER — Other Ambulatory Visit: Payer: Self-pay

## 2021-11-14 ENCOUNTER — Observation Stay
Admission: RE | Admit: 2021-11-14 | Discharge: 2021-11-15 | Disposition: A | Discharge status: Home Health | Payer: Medicare Other | Attending: Orthopedic Surgery | Admitting: Orthopedic Surgery

## 2021-11-14 ENCOUNTER — Observation Stay: Payer: Medicare Other

## 2021-11-14 ENCOUNTER — Ambulatory Visit: Payer: Medicare Other | Admitting: Urgent Care

## 2021-11-14 ENCOUNTER — Encounter: Payer: Self-pay | Admitting: Orthopedic Surgery

## 2021-11-14 ENCOUNTER — Encounter
Admission: RE | Disposition: A | Discharge status: Home Health | Payer: Self-pay | Source: Home / Self Care | Attending: Orthopedic Surgery

## 2021-11-14 DIAGNOSIS — I10 Essential (primary) hypertension: Secondary | ICD-10-CM | POA: Diagnosis not present

## 2021-11-14 DIAGNOSIS — Z96652 Presence of left artificial knee joint: Secondary | ICD-10-CM | POA: Diagnosis not present

## 2021-11-14 DIAGNOSIS — Z96651 Presence of right artificial knee joint: Secondary | ICD-10-CM | POA: Diagnosis not present

## 2021-11-14 DIAGNOSIS — Z79899 Other long term (current) drug therapy: Secondary | ICD-10-CM | POA: Diagnosis not present

## 2021-11-14 DIAGNOSIS — Z8541 Personal history of malignant neoplasm of cervix uteri: Secondary | ICD-10-CM | POA: Diagnosis not present

## 2021-11-14 DIAGNOSIS — Z96659 Presence of unspecified artificial knee joint: Secondary | ICD-10-CM

## 2021-11-14 DIAGNOSIS — M1712 Unilateral primary osteoarthritis, left knee: Secondary | ICD-10-CM | POA: Diagnosis not present

## 2021-11-14 DIAGNOSIS — R5383 Other fatigue: Secondary | ICD-10-CM

## 2021-11-14 DIAGNOSIS — Z8543 Personal history of malignant neoplasm of ovary: Secondary | ICD-10-CM | POA: Insufficient documentation

## 2021-11-14 DIAGNOSIS — Z7982 Long term (current) use of aspirin: Secondary | ICD-10-CM | POA: Insufficient documentation

## 2021-11-14 HISTORY — PX: KNEE ARTHROPLASTY: SHX992

## 2021-11-14 LAB — TYPE AND SCREEN
ABO/RH(D): A POS
Antibody Screen: NEGATIVE

## 2021-11-14 LAB — ABO/RH: ABO/RH(D): A POS

## 2021-11-14 SURGERY — ARTHROPLASTY, KNEE, TOTAL, USING IMAGELESS COMPUTER-ASSISTED NAVIGATION
Anesthesia: General | Site: Knee | Laterality: Left

## 2021-11-14 MED ORDER — 0.9 % SODIUM CHLORIDE (POUR BTL) OPTIME
TOPICAL | Status: DC | PRN
  Administered 2021-11-14: 1000 mL

## 2021-11-14 MED ORDER — CELECOXIB 200 MG PO CAPS
ORAL_CAPSULE | ORAL | Status: AC
  Administered 2021-11-14: 400 mg via ORAL
  Filled 2021-11-14: qty 2

## 2021-11-14 MED ORDER — BUPIVACAINE HCL (PF) 0.5 % IJ SOLN
INTRAMUSCULAR | Status: DC | PRN
  Administered 2021-11-14: 3 mL via INTRATHECAL

## 2021-11-14 MED ORDER — TIZANIDINE HCL 4 MG PO TABS
4.0000 mg | ORAL_TABLET | Freq: Every day | ORAL | Status: DC | PRN
  Filled 2021-11-14: qty 1

## 2021-11-14 MED ORDER — TRAZODONE HCL 50 MG PO TABS
50.0000 mg | ORAL_TABLET | Freq: Every day | ORAL | Status: DC
  Administered 2021-11-14: 100 mg via ORAL
  Filled 2021-11-14: qty 2

## 2021-11-14 MED ORDER — CELECOXIB 200 MG PO CAPS: 400.0000 mg | ORAL_CAPSULE | Freq: Once | ORAL | Status: AC

## 2021-11-14 MED ORDER — TRANEXAMIC ACID-NACL 1000-0.7 MG/100ML-% IV SOLN
1000.0000 mg | Freq: Once | INTRAVENOUS | Status: AC
  Administered 2021-11-14: 1000 mg via INTRAVENOUS

## 2021-11-14 MED ORDER — PROPOFOL 10 MG/ML IV BOLUS
INTRAVENOUS | Status: DC | PRN
Start: 2021-11-14 — End: 2021-11-14
  Administered 2021-11-14: 40 mg via INTRAVENOUS
  Administered 2021-11-14: 30 mg via INTRAVENOUS

## 2021-11-14 MED ORDER — TRANEXAMIC ACID-NACL 1000-0.7 MG/100ML-% IV SOLN
INTRAVENOUS | Status: AC
  Filled 2021-11-14: qty 100

## 2021-11-14 MED ORDER — ONDANSETRON HCL 4 MG PO TABS: 4.0000 mg | ORAL_TABLET | Freq: Four times a day (QID) | ORAL | Status: DC | PRN

## 2021-11-14 MED ORDER — ACETAMINOPHEN 10 MG/ML IV SOLN
INTRAVENOUS | Status: DC | PRN
Start: 2021-11-14 — End: 2021-11-14
  Administered 2021-11-14: 1000 mg via INTRAVENOUS

## 2021-11-14 MED ORDER — OXYCODONE HCL 5 MG/5ML PO SOLN: 5.0000 mg | Freq: Once | ORAL | Status: DC | PRN

## 2021-11-14 MED ORDER — FAMOTIDINE 20 MG PO TABS: 20.0000 mg | ORAL_TABLET | Freq: Once | ORAL | Status: AC

## 2021-11-14 MED ORDER — OXYCODONE HCL 5 MG PO TABS
10.0000 mg | ORAL_TABLET | ORAL | Status: DC | PRN
  Administered 2021-11-14 – 2021-11-15 (×2): 10 mg via ORAL
  Filled 2021-11-14 (×2): qty 2

## 2021-11-14 MED ORDER — LACTATED RINGERS IV SOLN: INTRAVENOUS | Status: DC

## 2021-11-14 MED ORDER — ACETAMINOPHEN 10 MG/ML IV SOLN
1000.0000 mg | Freq: Four times a day (QID) | INTRAVENOUS | Status: AC
  Administered 2021-11-14 – 2021-11-15 (×4): 1000 mg via INTRAVENOUS
  Filled 2021-11-14 (×4): qty 100

## 2021-11-14 MED ORDER — OXYCODONE HCL 5 MG PO TABS
5.0000 mg | ORAL_TABLET | ORAL | Status: DC | PRN
  Administered 2021-11-14 – 2021-11-15 (×2): 5 mg via ORAL
  Filled 2021-11-14 (×2): qty 1

## 2021-11-14 MED ORDER — MONTELUKAST SODIUM 10 MG PO TABS
10.0000 mg | ORAL_TABLET | Freq: Every day | ORAL | Status: DC
  Administered 2021-11-14: 10 mg via ORAL
  Filled 2021-11-14: qty 1

## 2021-11-14 MED ORDER — PHENYLEPHRINE HCL-NACL 20-0.9 MG/250ML-% IV SOLN
INTRAVENOUS | Status: DC | PRN
  Administered 2021-11-14: 25 ug/min via INTRAVENOUS

## 2021-11-14 MED ORDER — DEXAMETHASONE SODIUM PHOSPHATE 10 MG/ML IJ SOLN
INTRAMUSCULAR | Status: AC
  Administered 2021-11-14: 8 mg via INTRAVENOUS
  Filled 2021-11-14: qty 1

## 2021-11-14 MED ORDER — VITAMIN D 25 MCG (1000 UNIT) PO TABS
1000.0000 [IU] | ORAL_TABLET | Freq: Every day | ORAL | Status: DC
  Administered 2021-11-14 – 2021-11-15 (×2): 1000 [IU] via ORAL
  Filled 2021-11-14 (×2): qty 1

## 2021-11-14 MED ORDER — DEXAMETHASONE SODIUM PHOSPHATE 10 MG/ML IJ SOLN: 8.0000 mg | Freq: Once | INTRAMUSCULAR | Status: AC

## 2021-11-14 MED ORDER — ENOXAPARIN SODIUM 30 MG/0.3ML IJ SOSY
30.0000 mg | PREFILLED_SYRINGE | Freq: Two times a day (BID) | INTRAMUSCULAR | Status: DC
  Administered 2021-11-15: 30 mg via SUBCUTANEOUS
  Filled 2021-11-14: qty 0.3

## 2021-11-14 MED ORDER — CEFAZOLIN SODIUM-DEXTROSE 2-4 GM/100ML-% IV SOLN
INTRAVENOUS | Status: AC
  Filled 2021-11-14: qty 100

## 2021-11-14 MED ORDER — SURGIPHOR WOUND IRRIGATION SYSTEM - OPTIME
TOPICAL | Status: DC | PRN
Start: 2021-11-14 — End: 2021-11-14
  Administered 2021-11-14: 1 via TOPICAL

## 2021-11-14 MED ORDER — LORATADINE 10 MG PO TABS
10.0000 mg | ORAL_TABLET | Freq: Every day | ORAL | Status: DC
  Administered 2021-11-14 – 2021-11-15 (×2): 10 mg via ORAL
  Filled 2021-11-14 (×2): qty 1

## 2021-11-14 MED ORDER — CELECOXIB 200 MG PO CAPS
200.0000 mg | ORAL_CAPSULE | Freq: Two times a day (BID) | ORAL | Status: DC
  Administered 2021-11-14 – 2021-11-15 (×2): 200 mg via ORAL
  Filled 2021-11-14 (×2): qty 1

## 2021-11-14 MED ORDER — ACETAMINOPHEN 10 MG/ML IV SOLN: 1000.0000 mg | Freq: Once | INTRAVENOUS | Status: DC | PRN

## 2021-11-14 MED ORDER — ONDANSETRON HCL 4 MG/2ML IJ SOLN: 4.0000 mg | Freq: Four times a day (QID) | INTRAMUSCULAR | Status: DC | PRN

## 2021-11-14 MED ORDER — FENTANYL CITRATE (PF) 100 MCG/2ML IJ SOLN
INTRAMUSCULAR | Status: AC
  Filled 2021-11-14: qty 2

## 2021-11-14 MED ORDER — BUPIVACAINE HCL (PF) 0.25 % IJ SOLN
INTRAMUSCULAR | Status: DC | PRN
  Administered 2021-11-14: 60 mL

## 2021-11-14 MED ORDER — ORAL CARE MOUTH RINSE: 15.0000 mL | Freq: Once | OROMUCOSAL | Status: AC

## 2021-11-14 MED ORDER — CEFAZOLIN SODIUM-DEXTROSE 2-4 GM/100ML-% IV SOLN
2.0000 g | INTRAVENOUS | Status: AC
  Administered 2021-11-14: 2 g via INTRAVENOUS

## 2021-11-14 MED ORDER — SENNOSIDES-DOCUSATE SODIUM 8.6-50 MG PO TABS
1.0000 | ORAL_TABLET | Freq: Two times a day (BID) | ORAL | Status: DC
  Administered 2021-11-14 – 2021-11-15 (×2): 1 via ORAL
  Filled 2021-11-14 (×2): qty 1

## 2021-11-14 MED ORDER — ACETAMINOPHEN 325 MG PO TABS: 325.0000 mg | ORAL_TABLET | Freq: Four times a day (QID) | ORAL | Status: DC | PRN

## 2021-11-14 MED ORDER — GABAPENTIN 300 MG PO CAPS: 300.0000 mg | ORAL_CAPSULE | Freq: Once | ORAL | Status: AC

## 2021-11-14 MED ORDER — CHLORHEXIDINE GLUCONATE 4 % EX LIQD
60.0000 mL | Freq: Once | CUTANEOUS | Status: AC
  Administered 2021-11-14: 4 via TOPICAL

## 2021-11-14 MED ORDER — FAMOTIDINE 20 MG PO TABS
ORAL_TABLET | ORAL | Status: AC
  Administered 2021-11-14: 20 mg
  Filled 2021-11-14: qty 1

## 2021-11-14 MED ORDER — NEOMYCIN-POLYMYXIN B GU 40-200000 IR SOLN
Status: DC | PRN
  Administered 2021-11-14: 16 mL

## 2021-11-14 MED ORDER — CEFAZOLIN SODIUM-DEXTROSE 2-4 GM/100ML-% IV SOLN
2.0000 g | Freq: Four times a day (QID) | INTRAVENOUS | Status: AC
  Administered 2021-11-14 – 2021-11-15 (×2): 2 g via INTRAVENOUS
  Filled 2021-11-14 (×2): qty 100

## 2021-11-14 MED ORDER — FLEET ENEMA 7-19 GM/118ML RE ENEM: 1.0000 | ENEMA | Freq: Once | RECTAL | Status: DC | PRN

## 2021-11-14 MED ORDER — PHENOL 1.4 % MT LIQD: 1.0000 | OROMUCOSAL | Status: DC | PRN

## 2021-11-14 MED ORDER — OXYCODONE HCL 5 MG PO TABS: 5.0000 mg | ORAL_TABLET | Freq: Once | ORAL | Status: DC | PRN

## 2021-11-14 MED ORDER — FERROUS SULFATE 325 (65 FE) MG PO TABS
325.0000 mg | ORAL_TABLET | Freq: Two times a day (BID) | ORAL | Status: DC
  Administered 2021-11-14 – 2021-11-15 (×3): 325 mg via ORAL
  Filled 2021-11-14 (×3): qty 1

## 2021-11-14 MED ORDER — ENSURE PRE-SURGERY PO LIQD
296.0000 mL | Freq: Once | ORAL | Status: AC
  Administered 2021-11-14: 296 mL via ORAL
  Filled 2021-11-14: qty 296

## 2021-11-14 MED ORDER — ACETAMINOPHEN 10 MG/ML IV SOLN
INTRAVENOUS | Status: AC
  Filled 2021-11-14: qty 100

## 2021-11-14 MED ORDER — GLYCOPYRROLATE 0.2 MG/ML IJ SOLN
INTRAMUSCULAR | Status: DC | PRN
  Administered 2021-11-14: .2 mg via INTRAVENOUS

## 2021-11-14 MED ORDER — METOCLOPRAMIDE HCL 10 MG PO TABS
10.0000 mg | ORAL_TABLET | Freq: Three times a day (TID) | ORAL | Status: DC
Start: 2021-11-14 — End: 2021-11-15
  Administered 2021-11-14 – 2021-11-15 (×4): 10 mg via ORAL
  Filled 2021-11-14 (×9): qty 1

## 2021-11-14 MED ORDER — CHLORHEXIDINE GLUCONATE 0.12 % MT SOLN: 15.0000 mL | Freq: Once | OROMUCOSAL | Status: AC

## 2021-11-14 MED ORDER — SODIUM CHLORIDE 0.9 % IR SOLN
Status: DC | PRN
  Administered 2021-11-14: 3000 mL

## 2021-11-14 MED ORDER — MAGNESIUM HYDROXIDE 400 MG/5ML PO SUSP
30.0000 mL | Freq: Every day | ORAL | Status: DC
  Administered 2021-11-14 – 2021-11-15 (×2): 30 mL via ORAL
  Filled 2021-11-14 (×2): qty 30

## 2021-11-14 MED ORDER — AMLODIPINE BESYLATE 5 MG PO TABS
2.5000 mg | ORAL_TABLET | Freq: Every day | ORAL | Status: DC
  Administered 2021-11-15: 2.5 mg via ORAL
  Filled 2021-11-14 (×2): qty 1

## 2021-11-14 MED ORDER — GABAPENTIN 300 MG PO CAPS
ORAL_CAPSULE | ORAL | Status: AC
  Administered 2021-11-14: 300 mg via ORAL
  Filled 2021-11-14: qty 1

## 2021-11-14 MED ORDER — MENTHOL 3 MG MT LOZG: 1.0000 | LOZENGE | OROMUCOSAL | Status: DC | PRN

## 2021-11-14 MED ORDER — PROPOFOL 500 MG/50ML IV EMUL
INTRAVENOUS | Status: DC | PRN
Start: 2021-11-14 — End: 2021-11-14
  Administered 2021-11-14: 50 ug/kg/min via INTRAVENOUS

## 2021-11-14 MED ORDER — PANTOPRAZOLE SODIUM 40 MG PO TBEC
40.0000 mg | DELAYED_RELEASE_TABLET | Freq: Two times a day (BID) | ORAL | Status: DC
  Administered 2021-11-14 – 2021-11-15 (×2): 40 mg via ORAL
  Filled 2021-11-14 (×2): qty 1

## 2021-11-14 MED ORDER — MIDAZOLAM HCL 5 MG/5ML IJ SOLN
INTRAMUSCULAR | Status: DC | PRN
Start: 2021-11-14 — End: 2021-11-14
  Administered 2021-11-14: 1 mg via INTRAVENOUS

## 2021-11-14 MED ORDER — BISACODYL 10 MG RE SUPP: 10.0000 mg | Freq: Every day | RECTAL | Status: DC | PRN

## 2021-11-14 MED ORDER — SERTRALINE HCL 50 MG PO TABS
100.0000 mg | ORAL_TABLET | Freq: Every day | ORAL | Status: DC
  Administered 2021-11-14 – 2021-11-15 (×2): 100 mg via ORAL
  Filled 2021-11-14 (×2): qty 2

## 2021-11-14 MED ORDER — MIDAZOLAM HCL 2 MG/2ML IJ SOLN
INTRAMUSCULAR | Status: AC
Start: 2021-11-14 — End: ?
  Filled 2021-11-14: qty 2

## 2021-11-14 MED ORDER — DIPHENHYDRAMINE HCL 12.5 MG/5ML PO ELIX: 12.5000 mg | ORAL_SOLUTION | ORAL | Status: DC | PRN

## 2021-11-14 MED ORDER — SODIUM CHLORIDE 0.9 % IV SOLN: INTRAVENOUS | Status: DC

## 2021-11-14 MED ORDER — CHLORHEXIDINE GLUCONATE 0.12 % MT SOLN
OROMUCOSAL | Status: AC
  Administered 2021-11-14: 15 mL via OROMUCOSAL
  Filled 2021-11-14: qty 15

## 2021-11-14 MED ORDER — HYDROMORPHONE HCL 1 MG/ML IJ SOLN: 0.5000 mg | INTRAMUSCULAR | Status: DC | PRN

## 2021-11-14 MED ORDER — ONDANSETRON HCL 4 MG/2ML IJ SOLN: 4.0000 mg | Freq: Once | INTRAMUSCULAR | Status: DC | PRN

## 2021-11-14 MED ORDER — PROPOFOL 1000 MG/100ML IV EMUL
INTRAVENOUS | Status: AC
  Filled 2021-11-14: qty 100

## 2021-11-14 MED ORDER — TRAMADOL HCL 50 MG PO TABS: 50.0000 mg | ORAL_TABLET | ORAL | Status: DC | PRN

## 2021-11-14 MED ORDER — ALUM & MAG HYDROXIDE-SIMETH 200-200-20 MG/5ML PO SUSP: 30.0000 mL | ORAL | Status: DC | PRN

## 2021-11-14 MED ORDER — TRANEXAMIC ACID-NACL 1000-0.7 MG/100ML-% IV SOLN
1000.0000 mg | INTRAVENOUS | Status: AC
  Administered 2021-11-14: 1000 mg via INTRAVENOUS

## 2021-11-14 MED ORDER — ONDANSETRON HCL 4 MG/2ML IJ SOLN
INTRAMUSCULAR | Status: DC | PRN
  Administered 2021-11-14: 4 mg via INTRAVENOUS

## 2021-11-14 MED ORDER — FENTANYL CITRATE (PF) 100 MCG/2ML IJ SOLN
25.0000 ug | INTRAMUSCULAR | Status: DC | PRN
  Administered 2021-11-14 (×3): 25 ug via INTRAVENOUS

## 2021-11-14 MED ORDER — SODIUM CHLORIDE 0.9 % IV SOLN
INTRAVENOUS | Status: DC | PRN
  Administered 2021-11-14: 60 mL

## 2021-11-14 SURGICAL SUPPLY — 73 items
ATTUNE PS FEM LT SZ 5 CEM KNEE (Femur) ×1 IMPLANT
ATTUNE PSRP INSR SZ5 5 KNEE (Insert) ×1 IMPLANT
BASE TIBIAL ROT PLAT SZ 5 KNEE (Knees) IMPLANT
BATTERY INSTRU NAVIGATION (MISCELLANEOUS) ×12 IMPLANT
BLADE SAW 70X12.5 (BLADE) ×3 IMPLANT
BLADE SAW 90X13X1.19 OSCILLAT (BLADE) ×3 IMPLANT
BLADE SAW 90X25X1.19 OSCILLAT (BLADE) ×3 IMPLANT
BONE CEMENT GENTAMICIN (Cement) ×4 IMPLANT
BSPLAT TIB 5 CMNT ROT PLAT STR (Knees) ×1 IMPLANT
BTRY SRG DRVR LF (MISCELLANEOUS) ×4
CEMENT BONE GENTAMICIN 40 (Cement) IMPLANT
COOLER POLAR GLACIER W/PUMP (MISCELLANEOUS) ×3 IMPLANT
CUFF TOURN SGL QUICK 24 (TOURNIQUET CUFF)
CUFF TRNQT CYL 24X4X16.5-23 (TOURNIQUET CUFF) IMPLANT
DRAPE 3/4 80X56 (DRAPES) ×3 IMPLANT
DRAPE INCISE IOBAN 66X45 STRL (DRAPES) IMPLANT
DRSG DERMACEA 8X12 NADH (GAUZE/BANDAGES/DRESSINGS) ×3 IMPLANT
DRSG MEPILEX SACRM 8.7X9.8 (GAUZE/BANDAGES/DRESSINGS) ×3 IMPLANT
DRSG OPSITE POSTOP 4X12 (GAUZE/BANDAGES/DRESSINGS) ×1 IMPLANT
DRSG OPSITE POSTOP 4X14 (GAUZE/BANDAGES/DRESSINGS) ×3 IMPLANT
DRSG TEGADERM 4X4.75 (GAUZE/BANDAGES/DRESSINGS) ×3 IMPLANT
DURAPREP 26ML APPLICATOR (WOUND CARE) ×6 IMPLANT
ELECT CAUTERY BLADE 6.4 (BLADE) ×3 IMPLANT
ELECT REM PT RETURN 9FT ADLT (ELECTROSURGICAL) ×2
ELECTRODE REM PT RTRN 9FT ADLT (ELECTROSURGICAL) ×2 IMPLANT
EX-PIN ORTHOLOCK NAV 4X150 (PIN) ×6 IMPLANT
GLOVE BIOGEL M STRL SZ7.5 (GLOVE) ×12 IMPLANT
GLOVE BIOGEL PI IND STRL 8 (GLOVE) ×2 IMPLANT
GLOVE BIOGEL PI INDICATOR 8 (GLOVE) ×1
GLOVE SURG UNDER POLY LF SZ7.5 (GLOVE) ×3 IMPLANT
GOWN STRL REUS W/ TWL LRG LVL3 (GOWN DISPOSABLE) ×4 IMPLANT
GOWN STRL REUS W/ TWL XL LVL3 (GOWN DISPOSABLE) ×2 IMPLANT
GOWN STRL REUS W/TWL LRG LVL3 (GOWN DISPOSABLE) ×4
GOWN STRL REUS W/TWL XL LVL3 (GOWN DISPOSABLE) ×2
HEMOVAC 400CC 10FR (MISCELLANEOUS) ×3 IMPLANT
HOLDER FOLEY CATH W/STRAP (MISCELLANEOUS) ×3 IMPLANT
HOLSTER ELECTROSUGICAL PENCIL (MISCELLANEOUS) ×3 IMPLANT
HOOD PEEL AWAY FLYTE STAYCOOL (MISCELLANEOUS) ×6 IMPLANT
IV NS IRRIG 3000ML ARTHROMATIC (IV SOLUTION) ×3 IMPLANT
KIT TURNOVER KIT A (KITS) ×3 IMPLANT
KNIFE SCULPS 14X20 (INSTRUMENTS) ×3 IMPLANT
MANIFOLD NEPTUNE II (INSTRUMENTS) ×6 IMPLANT
NDL SPNL 20GX3.5 QUINCKE YW (NEEDLE) ×4 IMPLANT
NEEDLE SPNL 20GX3.5 QUINCKE YW (NEEDLE) ×4 IMPLANT
NS IRRIG 500ML POUR BTL (IV SOLUTION) ×3 IMPLANT
PACK TOTAL KNEE (MISCELLANEOUS) ×3 IMPLANT
PAD ABD DERMACEA PRESS 5X9 (GAUZE/BANDAGES/DRESSINGS) ×6 IMPLANT
PAD WRAPON POLAR KNEE (MISCELLANEOUS) ×2 IMPLANT
PATELLA MEDIAL ATTUN 35MM KNEE (Knees) ×1 IMPLANT
PIN DRILL FIX HALF THREAD (BIT) ×6 IMPLANT
PIN DRILL QUICK PACK ×6 IMPLANT
PIN FIXATION 1/8DIA X 3INL (PIN) ×3 IMPLANT
PULSAVAC PLUS IRRIG FAN TIP (DISPOSABLE) ×2
SOL PREP PVP 2OZ (MISCELLANEOUS) ×2
SOLUTION IRRIG SURGIPHOR (IV SOLUTION) ×3 IMPLANT
SOLUTION PREP PVP 2OZ (MISCELLANEOUS) ×2 IMPLANT
SPONGE DRAIN TRACH 4X4 STRL 2S (GAUZE/BANDAGES/DRESSINGS) ×3 IMPLANT
STAPLER SKIN PROX 35W (STAPLE) ×3 IMPLANT
STOCKINETTE IMPERV 14X48 (MISCELLANEOUS) ×1 IMPLANT
STRAP TIBIA SHORT (MISCELLANEOUS) ×3 IMPLANT
SUCTION FRAZIER HANDLE 10FR (MISCELLANEOUS) ×2
SUCTION TUBE FRAZIER 10FR DISP (MISCELLANEOUS) ×2 IMPLANT
SUT VIC AB 0 CT1 36 (SUTURE) ×6 IMPLANT
SUT VIC AB 1 CT1 36 (SUTURE) ×6 IMPLANT
SUT VIC AB 2-0 CT2 27 (SUTURE) ×3 IMPLANT
SYR 30ML LL (SYRINGE) ×6 IMPLANT
TIBIAL BASE ROT PLAT SZ 5 KNEE (Knees) ×2 IMPLANT
TIP FAN IRRIG PULSAVAC PLUS (DISPOSABLE) ×2 IMPLANT
TOWEL OR 17X26 4PK STRL BLUE (TOWEL DISPOSABLE) ×1 IMPLANT
TOWER CARTRIDGE SMART MIX (DISPOSABLE) ×3 IMPLANT
TRAY FOLEY MTR SLVR 16FR STAT (SET/KITS/TRAYS/PACK) ×3 IMPLANT
WATER STERILE IRR 1000ML POUR (IV SOLUTION) ×1 IMPLANT
WRAPON POLAR PAD KNEE (MISCELLANEOUS) ×2

## 2021-11-14 NOTE — Evaluation (Signed)
Physical Therapy Evaluation ?Patient Details ?Name: Lynn Mccoy ?MRN: [id number] ?DOB: Aug 01, 1940 ?Today's Date: 11/14/2021 ? ?History of Present Illness ? Pt is a 81 yo F diagnosed with degenerative arthrosis of the left knee and is s/p elective L TKA.  PMH includes: depression, OA, osteopenia, and HTN. ?  ?Clinical Impression ? Pt was pleasant and motivated to participate during the session and put forth good effort throughout. Pt required no physical assistance during the session but did require cuing for sequencing as well as extra time and effort with functional tasks.  Pt was steady with ambulation with a RW and reported no adverse symptoms other than mild L knee pain with SpO2 94% on room air and HR WNL.  Pt is expected to make good progress towards goals while in acute care and will benefit from HHPT upon discharge to safely address deficits listed in patient problem list for decreased caregiver assistance and eventual return to PLOF. ? ?   ?   ? ?Recommendations for follow up therapy are one component of a multi-disciplinary discharge planning process, led by the attending physician.  Recommendations may be updated based on patient status, additional functional criteria and insurance authorization. ? ?Follow Up Recommendations Home health PT ? ?  ?Assistance Recommended at Discharge Frequent or constant Supervision/Assistance  ?Patient can return home with the following ? A little help with walking and/or transfers;A little help with bathing/dressing/bathroom;Assistance with cooking/housework;Help with stairs or ramp for entrance;Assist for transportation ? ?  ?Equipment Recommendations Rolling walker (2 wheels)  ?Recommendations for Other Services ?    ?  ?Functional Status Assessment Patient has had a recent decline in their functional status and demonstrates the ability to make significant improvements in function in a reasonable and predictable amount of time.  ? ?  ?Precautions / Restrictions  Precautions ?Precautions: Fall ?Splint/Cast - Date Prophylactic Dressing Applied (if applicable): 17/79/39 ?Restrictions ?Weight Bearing Restrictions: Yes ?LLE Weight Bearing: Weight bearing as tolerated ?Other Position/Activity Restrictions: Pt able to perform independent LLE SLR without extensor lag, no KI required  ? ?  ? ?Mobility ? Bed Mobility ?Overal bed mobility: Modified Independent ?  ?  ?  ?  ?  ?  ?General bed mobility comments: Min extra time, effort, and use of rails required ?  ? ?Transfers ?Overall transfer level: Needs assistance ?Equipment used: Rolling walker (2 wheels) ?Transfers: Sit to/from Stand ?Sit to Stand: Min guard ?  ?  ?  ?  ?  ?General transfer comment: Mod verbal and visual cues for sequencing for L foot positioning and hand placement ?  ? ?Ambulation/Gait ?Ambulation/Gait assistance: Min guard ?Gait Distance (Feet): 10 Feet ?Assistive device: Rolling walker (2 wheels) ?Gait Pattern/deviations: Step-to pattern, Decreased stance time - left, Antalgic ?Gait velocity: decreased ?  ?  ?General Gait Details: Mildly antalgic step-to pattern with cues for proper sequencing but steady without LOB or buckling ? ?Stairs ?  ?  ?  ?  ?  ? ?Wheelchair Mobility ?  ? ?Modified Rankin (Stroke Patients Only) ?  ? ?  ? ?Balance Overall balance assessment: Needs assistance ?  ?Sitting balance-Leahy Scale: Good ?  ?  ?Standing balance support: Bilateral upper extremity supported, During functional activity ?Standing balance-Leahy Scale: Good ?  ?  ?  ?  ?  ?  ?  ?  ?  ?  ?  ?  ?   ? ? ? ?Pertinent Vitals/Pain Pain Assessment ?Pain Assessment: 0-10 ?Pain Score: 2  ?Pain Location: L knee ?  Pain Descriptors / Indicators: Sore ?Pain Intervention(s): Repositioned, Monitored during session, Premedicated before session, Ice applied  ? ? ?Home Living Family/patient expects to be discharged to:: Private residence ?Living Arrangements: Alone ?Available Help at Discharge: Family;Available 24 hours/day ?Type of Home:  House ?Home Access: Stairs to enter ?Entrance Stairs-Rails: None ?Entrance Stairs-Number of Steps: 1 ?  ?Home Layout: One level ?Home Equipment: Standard Walker;BSC/3in1;Grab bars - tub/shower ?   ?  ?Prior Function Prior Level of Function : Independent/Modified Independent ?  ?  ?  ?  ?  ?  ?Mobility Comments: Ind amb community distances without an AD, no fall history, mows yard with a push mower ?ADLs Comments: Ind with ADLs ?  ? ? ?Hand Dominance  ?   ? ?  ?Extremity/Trunk Assessment  ? Upper Extremity Assessment ?Upper Extremity Assessment: Overall WFL for tasks assessed ?  ? ?Lower Extremity Assessment ?Lower Extremity Assessment: LLE deficits/detail ?LLE Deficits / Details: BLE ankle AROM, strength, and sensation to light touch grossly WNL; L hip flex strength >/= 3/5 ?LLE: Unable to fully assess due to pain ?LLE Sensation: WNL ?LLE Coordination: WNL ?  ? ?   ?Communication  ? Communication: No difficulties  ?Cognition Arousal/Alertness: Awake/alert ?Behavior During Therapy: [facility] for tasks assessed/performed ?Overall Cognitive Status: Within Functional Limits for tasks assessed ?  ?  ?  ?  ?  ?  ?  ?  ?  ?  ?  ?  ?  ?  ?  ?  ?  ?  ?  ? ?  ?General Comments   ? ?  ?Exercises Total Joint Exercises ?Ankle Circles/Pumps: AROM, Strengthening, Both, 10 reps ?Quad Sets: AROM, Strengthening, Left, 5 reps, 10 reps ?Straight Leg Raises: AROM, Strengthening, Left, 5 reps ?Long Arc Quad: AROM, Strengthening, Left, 10 reps, 15 reps ?Knee Flexion: AROM, Strengthening, Left, 10 reps, 15 reps ?Goniometric ROM: L knee AROM: 6-61 deg ?Marching in Standing: AROM, Strengthening, Both, 5 reps, Standing ?Other Exercises ?Other Exercises: Positioning education to promote L knee ext PROM ?Other Exercises: HEP education per handout, emphasis on LLE QS and knee flex exercises  ? ?Assessment/Plan  ?  ?PT Assessment Patient needs continued PT services  ?PT Problem List Decreased strength;Decreased range of motion;Decreased activity  tolerance;Decreased balance;Decreased mobility;Decreased knowledge of use of DME;Pain ? ?   ?  ?PT Treatment Interventions DME instruction;Gait training;Stair training;Functional mobility training;Therapeutic activities;Therapeutic exercise;Balance training;Patient/family education   ? ?PT Goals (Current goals can be found in the Care Plan section)  ?Acute Rehab PT Goals ?Patient Stated Goal: To dance again ?PT Goal Formulation: With patient ?Time For Goal Achievement: 11/27/21 ?Potential to Achieve Goals: Good ? ?  ?Frequency BID ?  ? ? ?Co-evaluation   ?  ?  ?  ?  ? ? ?  ?AM-PAC PT "6 Clicks" Mobility  ?Outcome Measure Help needed turning from your back to your side while in a flat bed without using bedrails?: A Little ?Help needed moving from lying on your back to sitting on the side of a flat bed without using bedrails?: A Little ?Help needed moving to and from a bed to a chair (including a wheelchair)?: A Little ?Help needed standing up from a chair using your arms (e.g., wheelchair or bedside chair)?: A Little ?Help needed to walk in hospital room?: A Little ?Help needed climbing 3-5 steps with a railing? : A Lot ?6 Click Score: 17 ? ?  ?End of Session Equipment Utilized During Treatment: Gait belt ?Activity Tolerance: Patient tolerated  treatment well ?Patient left: in chair;with call bell/phone within reach;with chair alarm set;with family/visitor present;with SCD's reapplied;Other (comment) (polar care donned to L knee) ?Nurse Communication: Mobility status;Weight bearing status ?PT Visit Diagnosis: Other abnormalities of gait and mobility (R26.89);Muscle weakness (generalized) (M62.81);Pain ?Pain - Right/Left: Left ?Pain - part of body: Knee ?  ? ?Time: 6815-9470 ?PT Time Calculation (min) (ACUTE ONLY): 39 min ? ? ?Charges:   PT Evaluation ?$PT Eval Moderate Complexity: 1 Mod ?PT Treatments ?$Therapeutic Exercise: 8-22 mins ?$Therapeutic Activity: 8-22 mins ?  ?   ? ? ?D. Royetta Asal PT, DPT ?11/14/21,  5:33 PM ? ? ?

## 2021-11-14 NOTE — Anesthesia Preprocedure Evaluation (Addendum)
Anesthesia Evaluation  ?Patient identified by MRN, date of birth, ID band ?Patient awake ? ? ? ?Reviewed: ?Allergy & Precautions, NPO status , Patient's Chart, lab work & pertinent test results ? ?History of Anesthesia Complications ?Negative for: history of anesthetic complications ? ?Airway ?Mallampati: I ? ? ?Neck ROM: Full ? ? ? Dental ? ?(+) Lower Dentures, Upper Dentures ?  ?Pulmonary ?neg pulmonary ROS,  ?  ?Pulmonary exam normal ?breath sounds clear to auscultation ? ? ? ? ? ? Cardiovascular ?hypertension, Normal cardiovascular exam ?Rhythm:Regular Rate:Normal ? ?ECG 11/03/21:  ?Sinus bradycardia (HR 59) ?Otherwise normal ECG ?  ?Neuro/Psych ?PSYCHIATRIC DISORDERS Depression negative neurological ROS ?   ? GI/Hepatic ?negative GI ROS,   ?Endo/Other  ?Obesity  ? Renal/GU ?negative Renal ROS  ? ?  ?Musculoskeletal ? ? Abdominal ?  ?Peds ? Hematology ?negative hematology ROS ?(+)   ?Anesthesia Other Findings ? ? Reproductive/Obstetrics ?Cervical and ovarian cancers ? ?  ? ? ? ? ? ? ? ? ? ? ? ? ? ?  ?  ? ? ? ? ? ? ? ?Anesthesia Physical ?Anesthesia Plan ? ?ASA: 2 ? ?Anesthesia Plan: General and Spinal  ? ?Post-op Pain Management:   ? ?Induction: Intravenous ? ?PONV Risk Score and Plan: 3 and Propofol infusion, TIVA, Treatment may vary due to age or medical condition and Ondansetron ? ?Airway Management Planned: Natural Airway and Nasal Cannula ? ?Additional Equipment:  ? ?Intra-op Plan:  ? ?Post-operative Plan:  ? ?Informed Consent: I have reviewed the patients History and Physical, chart, labs and discussed the procedure including the risks, benefits and alternatives for the proposed anesthesia with the patient or authorized representative who has indicated his/her understanding and acceptance.  ? ? ? ? ? ?Plan Discussed with: CRNA ? ?Anesthesia Plan Comments: (Plan for spinal and GA with natural airway, LMA/GETA backup.  Patient consented for risks of anesthesia including but  not limited to:  ?- adverse reactions to medications ?- damage to eyes, teeth, lips or other oral mucosa ?- nerve damage due to positioning  ?- sore throat or hoarseness ?- headache, bleeding, infection, nerve damage 2/2 spinal ?- damage to heart, brain, nerves, lungs, other parts of body or loss of life ? ?Informed patient about role of CRNA in peri- and intra-operative care.  Patient voiced understanding.)  ? ? ? ? ? ? ?Anesthesia Quick Evaluation ? ?

## 2021-11-14 NOTE — Anesthesia Procedure Notes (Signed)
Spinal ? ?Patient location during procedure: OR ?Start time: 11/14/2021 12:06 PM ?End time: 11/14/2021 12:11 PM ?Reason for block: surgical anesthesia ?Staffing ?Performed: anesthesiologist  ?Anesthesiologist: Darrin Nipper, MD ?Preanesthetic Checklist ?Completed: patient identified, IV checked, site marked, risks and benefits discussed, surgical consent, monitors and equipment checked, pre-op evaluation and timeout performed ?Spinal Block ?Patient position: sitting ?Prep: ChloraPrep ?Patient monitoring: heart rate, cardiac monitor, continuous pulse ox and blood pressure ?Approach: midline ?Interspace: L1-2. ?Injection technique: single-shot ?Needle ?Needle type: Whitacre  ?Needle gauge: 22 G ?Needle length: 9 cm ?Assessment ?Sensory level: T4 ?Events: CSF return ?Additional Notes ?Os encountered midline in initial attempts by both Kaelah Hayashi and Tamala Julian; ultimately placed right paramedian at L1-2 by [employer]. ? ? ? ?

## 2021-11-14 NOTE — Transfer of Care (Signed)
Immediate Anesthesia Transfer of Care Note ? ?Patient: Lynn Mccoy ? ?Procedure(s) Performed: COMPUTER ASSISTED TOTAL KNEE ARTHROPLASTY (Left: Knee) ? ?Patient Location: PACU ? ?Anesthesia Type:Spinal ? ?Level of Consciousness: awake, alert  and oriented ? ?Airway & Oxygen Therapy: Patient Spontanous Breathing and Patient connected to face mask oxygen ? ?Post-op Assessment: Report given to RN and Post -op Vital signs reviewed and stable ? ?Post vital signs: Reviewed and stable ? ?Last Vitals:  ?Vitals Value Taken Time  ?BP 132/81 11/14/21 1531  ?Temp    ?Pulse 89 11/14/21 1533  ?Resp 26 11/14/21 1533  ?SpO2 98 % 11/14/21 1533  ?Vitals shown include unvalidated device data. ? ?Last Pain:  ?Vitals:  ? 11/14/21 1027  ?TempSrc: Temporal  ?PainSc: 0-No pain  ?   ? ?Patients Stated Pain Goal: 0 (11/14/21 1027) ? ?Complications: No notable events documented. ?

## 2021-11-14 NOTE — H&P (Signed)
The patient has been re-examined, and the chart reviewed, and there have been no interval changes to the documented history and physical.    The risks, benefits, and alternatives have been discussed at length. The patient expressed understanding of the risks benefits and agreed with plans for surgical intervention.  Santos Sollenberger P. Porfiria Heinrich, Jr. M.D.    

## 2021-11-14 NOTE — Op Note (Signed)
OPERATIVE NOTE ? ?DATE OF SURGERY:  11/14/2021 ? ?PATIENT NAME:  Lynn Mccoy   ?DOB: 11-16-1940  ?MRN: [id number] ? ?PRE-OPERATIVE DIAGNOSIS: Degenerative arthrosis of the left knee, primary ? ?POST-OPERATIVE DIAGNOSIS:  Same ? ?PROCEDURE:  Left total knee arthroplasty using computer-assisted navigation ? ?SURGEON:  Marciano Sequin. M.D. ? ?ASSISTANT: Cassell Smiles, PA-C (present and scrubbed throughout the case, critical for assistance with exposure, retraction, instrumentation, and closure) ? ?ANESTHESIA: spinal ? ?ESTIMATED BLOOD LOSS: 50 mL ? ?FLUIDS REPLACED: 1000 mL of crystalloid ? ?TOURNIQUET TIME: 80 minutes ? ?DRAINS: 2 medium Hemovac drains ? ?SOFT TISSUE RELEASES: Anterior cruciate ligament, posterior cruciate ligament, deep medial collateral ligament, patellofemoral ligament ? ?IMPLANTS UTILIZED: DePuy Attune size 5 posterior stabilized femoral component (cemented), size 5 rotating platform tibial component (cemented), 35 mm medialized dome patella (cemented), and a 5 mm stabilized rotating platform polyethylene insert. ? ?INDICATIONS FOR SURGERY: Lynn Mccoy is a 81 y.o. year old female with a long history of progressive knee pain. X-rays demonstrated severe degenerative changes in tricompartmental fashion. The patient had not seen any significant improvement despite conservative nonsurgical intervention. After discussion of the risks and benefits of surgical intervention, the patient expressed understanding of the risks benefits and agree with plans for total knee arthroplasty.  ? ?The risks, benefits, and alternatives were discussed at length including but not limited to the risks of infection, bleeding, nerve injury, stiffness, blood clots, the need for revision surgery, cardiopulmonary complications, among others, and they were willing to proceed. ? ?PROCEDURE IN DETAIL: The patient was brought into the operating room and, after adequate spinal anesthesia was achieved, a tourniquet was placed on  the patient's upper thigh. The patient's knee and leg were cleaned and prepped with alcohol and DuraPrep and draped in the usual sterile fashion. A "timeout" was performed as per usual protocol. The lower extremity was exsanguinated using an Esmarch, and the tourniquet was inflated to 300 mmHg. An anterior longitudinal incision was made followed by a standard mid vastus approach. The deep fibers of the medial collateral ligament were elevated in a subperiosteal fashion off of the medial flare of the tibia so as to maintain a continuous soft tissue sleeve. The patella was subluxed laterally and the patellofemoral ligament was incised. Inspection of the knee demonstrated severe degenerative changes with full-thickness loss of articular cartilage. Osteophytes were debrided using a rongeur. Anterior and posterior cruciate ligaments were excised. Two 4.0 mm Schanz pins were inserted in the femur and into the tibia for attachment of the array of trackers used for computer-assisted navigation. Hip center was identified using a circumduction technique. Distal landmarks were mapped using the computer. The distal femur and proximal tibia were mapped using the computer. The distal femoral cutting guide was positioned using computer-assisted navigation so as to achieve a 5? distal valgus cut. The femur was sized and it was felt that a size 5 femoral component was appropriate. A size 5 femoral cutting guide was positioned and the anterior cut was performed and verified using the computer. This was followed by completion of the posterior and chamfer cuts. Femoral cutting guide for the central box was then positioned in the center box cut was performed. ? ?Attention was then directed to the proximal tibia. Medial and lateral menisci were excised. The extramedullary tibial cutting guide was positioned using computer-assisted navigation so as to achieve a 0? varus-valgus alignment and 3? posterior slope. The cut was performed and  verified using the computer. The proximal tibia was  sized and it was felt that a size 5 tibial tray was appropriate. Tibial and femoral trials were inserted followed by insertion of a 5 mm polyethylene insert. This allowed for excellent mediolateral soft tissue balancing both in flexion and in full extension. Finally, the patella was cut and prepared so as to accommodate a 5 mm medialized dome patella. A patella trial was placed and the knee was placed through a range of motion with excellent patellar tracking appreciated. The femoral trial was removed after debridement of posterior osteophytes. The central post-hole for the tibial component was reamed followed by insertion of a keel punch. Tibial trials were then removed. Cut surfaces of bone were irrigated with copious amounts of normal saline using pulsatile lavage and then suctioned dry. Polymethylmethacrylate cement with gentamicin was prepared in the usual fashion using a vacuum mixer. Cement was applied to the cut surface of the proximal tibia as well as along the undersurface of a size 5 rotating platform tibial component. Tibial component was positioned and impacted into place. Excess cement was removed using [employer] fast streamer. Cement was then applied to the cut surfaces of the femur as well as along the posterior flanges of the size 5 femoral component. The femoral component was positioned and impacted into place. Excess cement was removed using [employer] fast streamer. A 5 mm polyethylene trial was inserted and the knee was brought into full extension with steady axial compression applied. Finally, cement was applied to the backside of a 35 mm medialized dome patella and the patellar component was positioned and patellar clamp applied. Excess cement was removed using [employer] fast streamer. After adequate curing of the cement, the tourniquet was deflated after a total tourniquet time of 80 minutes. Hemostasis was achieved using electrocautery. The knee was irrigated with  copious amounts of normal saline using pulsatile lavage followed by 450 ml of Surgiphor and then suctioned dry. 20 mL of 1.3% Exparel and 60 mL of 0.25% Marcaine in 40 mL of normal saline was injected along the posterior capsule, medial and lateral gutters, and along the arthrotomy site. A 5 mm stabilized rotating platform polyethylene insert was inserted and the knee was placed through a range of motion with excellent mediolateral soft tissue balancing appreciated and excellent patellar tracking noted. 2 medium drains were placed in the wound bed and brought out through separate stab incisions. The medial parapatellar portion of the incision was reapproximated using interrupted sutures of #1 Vicryl. Subcutaneous tissue was approximated in layers using first #0 Vicryl followed #2-0 Vicryl. The skin was approximated with skin staples. A sterile dressing was applied. ? ?The patient tolerated the procedure well and was transported to the recovery room in stable condition.   ? ?Thomasina Housley P. Holley Bouche., M.D.  ?

## 2021-11-15 ENCOUNTER — Other Ambulatory Visit: Payer: Self-pay

## 2021-11-15 ENCOUNTER — Encounter: Payer: Self-pay | Admitting: Orthopedic Surgery

## 2021-11-15 DIAGNOSIS — Z7982 Long term (current) use of aspirin: Secondary | ICD-10-CM | POA: Diagnosis not present

## 2021-11-15 DIAGNOSIS — I10 Essential (primary) hypertension: Secondary | ICD-10-CM | POA: Diagnosis not present

## 2021-11-15 DIAGNOSIS — Z79899 Other long term (current) drug therapy: Secondary | ICD-10-CM | POA: Diagnosis not present

## 2021-11-15 DIAGNOSIS — Z96651 Presence of right artificial knee joint: Secondary | ICD-10-CM | POA: Diagnosis not present

## 2021-11-15 DIAGNOSIS — M1712 Unilateral primary osteoarthritis, left knee: Secondary | ICD-10-CM | POA: Diagnosis not present

## 2021-11-15 DIAGNOSIS — Z96659 Presence of unspecified artificial knee joint: Secondary | ICD-10-CM | POA: Diagnosis not present

## 2021-11-15 MED ORDER — TRAZODONE HCL 100 MG PO TABS: 100.0000 mg | ORAL_TABLET | Freq: Every day | ORAL | Status: DC

## 2021-11-15 MED ORDER — CELECOXIB 200 MG PO CAPS: 200.0000 mg | ORAL_CAPSULE | Freq: Two times a day (BID) | ORAL | 0 refills | Status: DC

## 2021-11-15 MED ORDER — OXYCODONE HCL 5 MG PO TABS: 5.0000 mg | ORAL_TABLET | ORAL | 0 refills | Status: DC | PRN

## 2021-11-15 MED ORDER — ENOXAPARIN SODIUM 40 MG/0.4ML IJ SOSY: 40.0000 mg | PREFILLED_SYRINGE | INTRAMUSCULAR | 0 refills | Status: DC

## 2021-11-15 MED ORDER — TRAMADOL HCL 50 MG PO TABS: 50.0000 mg | ORAL_TABLET | ORAL | 0 refills | Status: DC | PRN

## 2021-11-15 NOTE — Progress Notes (Signed)
Physical Therapy Treatment ?Patient Details ?Name: Lynn Mccoy ?MRN: [id number] ?DOB: Jan 21, 1941 ?Today's Date: 11/15/2021 ? ? ?History of Present Illness Pt is a 81 yo F diagnosed with degenerative arthrosis of the left knee and is s/p elective L TKA.  PMH includes: depression, OA, osteopenia, and HTN. ? ?  ?PT Comments  ? ? Pt was pleasant and motivated to participate during the session and put forth good effort throughout. Pt's family present during the session and educated with patient on HEP, positioning, car transfer sequencing, and participated in stair training.  Pt steady with transfers and gait and demonstrated good control and stability as well as good carryover of proper sequencing during stair training.  Pt will benefit from HHPT upon discharge to safely address deficits listed in patient problem list for decreased caregiver assistance and eventual return to PLOF. ? ?   ?Recommendations for follow up therapy are one component of a multi-disciplinary discharge planning process, led by the attending physician.  Recommendations may be updated based on patient status, additional functional criteria and insurance authorization. ? ?Follow Up Recommendations ? Home health PT ?  ?  ?Assistance Recommended at Discharge Frequent or constant Supervision/Assistance  ?Patient can return home with the following A little help with walking and/or transfers;A little help with bathing/dressing/bathroom;Assistance with cooking/housework;Help with stairs or ramp for entrance;Assist for transportation ?  ?Equipment Recommendations ? Rolling walker (2 wheels)  ?  ?Recommendations for Other Services   ? ? ?  ?Precautions / Restrictions Precautions ?Precautions: Fall ?Restrictions ?Weight Bearing Restrictions: Yes ?LLE Weight Bearing: Weight bearing as tolerated ?Other Position/Activity Restrictions: Pt able to perform independent LLE SLR without extensor lag, no KI required  ?  ? ?Mobility ? Bed Mobility ?Overal bed mobility:  Modified Independent ?  ?  ?  ?  ?  ?  ?General bed mobility comments: Min extra time and effort only ?  ? ?Transfers ?Overall transfer level: Needs assistance ?Equipment used: Rolling walker (2 wheels) ?Transfers: Sit to/from Stand ?Sit to Stand: Supervision ?  ?  ?  ?  ?  ?General transfer comment: Min verbal cues for safe hand and L foot placement ?  ? ?Ambulation/Gait ?Ambulation/Gait assistance: Min guard ?Gait Distance (Feet): 100 Feet ?Assistive device: Rolling walker (2 wheels) ?Gait Pattern/deviations: Step-to pattern, Decreased stance time - left, Antalgic ?Gait velocity: decreased ?  ?  ?General Gait Details: Mildly antalgic step-to pattern that remained step-through pattern this session for pain control with good control and stability throughout ? ? ?Stairs ?Stairs: Yes ?Stairs assistance: Min guard ?Stair Management: With walker, Backwards, Forwards, Step to pattern ?Number of Stairs: 1 ?General stair comments: Pt able to ascend backwards and descend forwards 1 step x 3 with good carryover of proper sequencing and good stability throughout; pt's family present and involved in training ? ? ?Wheelchair Mobility ?  ? ?Modified Rankin (Stroke Patients Only) ?  ? ? ?  ?Balance Overall balance assessment: Needs assistance ?  ?Sitting balance-Leahy Scale: Normal ?  ?  ?Standing balance support: Bilateral upper extremity supported, During functional activity ?Standing balance-Leahy Scale: Good ?  ?  ?  ?  ?  ?  ?  ?  ?  ?  ?  ?  ?  ? ?  ?Cognition Arousal/Alertness: Awake/alert ?Behavior During Therapy: [facility] for tasks assessed/performed ?Overall Cognitive Status: Within Functional Limits for tasks assessed ?  ?  ?  ?  ?  ?  ?  ?  ?  ?  ?  ?  ?  ?  ?  ?  ?  ?  ?  ? ?  ?  Exercises Total Joint Exercises ?Quad Sets: AROM, Strengthening, Left, 10 reps ?Long Arc Quad: AROM, Strengthening, Left, 10 reps ?Knee Flexion: AROM, Strengthening, Left, 10 reps ?Goniometric ROM: L knee AROM: 6-89 deg ?Marching in Standing: AROM,  Strengthening, Both, 5 reps, Standing ?Other Exercises ?Other Exercises: Car transfer sequencing education with pt and family ?Other Exercises: HEP education per handout with pt and family, emphasis on LLE QS and knee flex exercises ? ?  ?General Comments   ?  ?  ? ?Pertinent Vitals/Pain Pain Assessment ?Pain Assessment: 0-10 ?Pain Score: 5  ?Pain Location: L knee ?Pain Descriptors / Indicators: Sore ?Pain Intervention(s): Repositioned, Premedicated before session, Patient requesting pain meds-RN notified, Ice applied  ? ? ?Home Living Family/patient expects to be discharged to:: Private residence ?Living Arrangements: Alone ?Available Help at Discharge: Family ?Type of Home: House ?Home Access: Stairs to enter ?Entrance Stairs-Rails: None ?Entrance Stairs-Number of Steps: 1 ?  ?Home Layout: One level ?Home Equipment: Grab bars - tub/shower;Toilet riser ?   ?  ?Prior Function    ?  ?  ?   ? ?PT Goals (current goals can now be found in the care plan section) Progress towards PT goals: Progressing toward goals ? ?  ?Frequency ? ? ? BID ? ? ? ?  ?PT Plan Current plan remains appropriate  ? ? ?Co-evaluation   ?  ?  ?  ?  ? ?  ?AM-PAC PT "6 Clicks" Mobility   ?Outcome Measure ? Help needed turning from your back to your side while in a flat bed without using bedrails?: A Little ?Help needed moving from lying on your back to sitting on the side of a flat bed without using bedrails?: A Little ?Help needed moving to and from a bed to a chair (including a wheelchair)?: A Little ?Help needed standing up from a chair using your arms (e.g., wheelchair or bedside chair)?: A Little ?Help needed to walk in hospital room?: A Little ?Help needed climbing 3-5 steps with a railing? : A Little ?6 Click Score: 18 ? ?  ?End of Session Equipment Utilized During Treatment: Gait belt ?Activity Tolerance: Patient tolerated treatment well ?Patient left: in bed;with call bell/phone within reach;with bed alarm set;with SCD's reapplied;with  family/visitor present;Other (comment) (polar care donned to L knee) ?Nurse Communication: Mobility status;Weight bearing status ?PT Visit Diagnosis: Other abnormalities of gait and mobility (R26.89);Muscle weakness (generalized) (M62.81);Pain ?Pain - Right/Left: Left ?Pain - part of body: Knee ?  ? ? ?Time: 2376-2831 ?PT Time Calculation (min) (ACUTE ONLY): 28 min ? ?Charges:  $Gait Training: 8-22 mins ?$Therapeutic Exercise: 8-22 mins ?$Therapeutic Activity: 8-22 mins          ?          ? ?D. Royetta Asal PT, DPT ?11/15/21, 2:41 PM ? ? ?

## 2021-11-15 NOTE — Progress Notes (Signed)
Met with the patient in the room at the bedside ?The patient lives at Home alone but her daughter and grand daughter will stay with her ?The patient  currently has DME 3 in 1 ?The patient will need rolling walker to be delivered by [employer] to the bedside ?They have transportation with her daughter ?They can afford their medication ? ?They are set up with [facility] for Home health services ? ? ? ?

## 2021-11-15 NOTE — Anesthesia Postprocedure Evaluation (Signed)
Anesthesia Post Note ? ?Patient: Lynn Mccoy ? ?Procedure(s) Performed: COMPUTER ASSISTED TOTAL KNEE ARTHROPLASTY (Left: Knee) ? ?Patient location during evaluation: PACU ?Anesthesia Type: Spinal ?Level of consciousness: awake and alert, oriented and patient cooperative ?Pain management: pain level controlled ?Vital Signs Assessment: post-procedure vital signs reviewed and stable ?Respiratory status: spontaneous breathing, nonlabored ventilation and respiratory function stable ?Cardiovascular status: blood pressure returned to baseline and stable ?Postop Assessment: adequate PO intake ?Anesthetic complications: no ? ? ?No notable events documented. ? ? ?Last Vitals:  ?Vitals:  ? 11/15/21 0541 11/15/21 0731  ?BP: (!) 114/46 (!) 115/39  ?Pulse: (!) 50 (!) 53  ?Resp: 16 16  ?Temp:  (!) 36.4 ?C  ?SpO2: 94% 94%  ?  ?Last Pain:  ?Vitals:  ? 11/15/21 0800  ?TempSrc:   ?PainSc: 4   ? ? ?  ?  ?  ?  ?  ?  ? ?Darrin Nipper ? ? ? ? ?

## 2021-11-15 NOTE — Evaluation (Signed)
Occupational Therapy Evaluation ?Patient Details ?Name: Lynn Mccoy ?MRN: [id number] ?DOB: [birth year] ?Today's Date: 11/15/2021 ? ? ?History of Present Illness Pt is a 81 yo F diagnosed with degenerative arthrosis of the left knee and is s/p elective L TKA.  PMH includes: depression, OA, osteopenia, and HTN.  ? ?Clinical Impression ?  ?Lynn Mccoy was seen for OT evaluation this date. Prior to hospital admission, pt was Independent for mobility and I/ADLs. Pt lives alone with plans for daughter to stay as needed. Pt currently requires MOD I don/doff B socks sitting EOB, anticipate needing SUPERVISION for standing dressing tasks 2/2 mild impulsivity and safety awareness concerns. SUPERVISION + RW for BSC t/f and pericare in sitting. SBA + RW functional reaching task. Pt instructed in polar care mgt, falls prevention strategies, home/routines modifications, and compression stocking mgt. All education complete, will sign off. Pt would benefit from skilled OT to address noted impairments and functional limitations (see below for any additional details). Upon hospital discharge, recommend no OT follow up.  ?   ? ?Recommendations for follow up therapy are one component of a multi-disciplinary discharge planning process, led by the attending physician.  Recommendations may be updated based on patient status, additional functional criteria and insurance authorization.  ? ?Follow Up Recommendations ? No OT follow up  ?  ?Assistance Recommended at Discharge Set up Supervision/Assistance  ?Patient can return home with the following A little help with walking and/or transfers;A little help with bathing/dressing/bathroom ? ?  ?Functional Status Assessment ? Patient has had a recent decline in their functional status and demonstrates the ability to make significant improvements in function in a reasonable and predictable amount of time.  ?Equipment Recommendations ? None recommended by OT  ?  ?Recommendations for Other Services    ? ? ?  ?Precautions / Restrictions Precautions ?Precautions: Fall ?Restrictions ?Weight Bearing Restrictions: Yes ?LLE Weight Bearing: Weight bearing as tolerated ?Other Position/Activity Restrictions: Pt able to perform independent LLE SLR without extensor lag, no KI required  ? ?  ? ?Mobility Bed Mobility ?Overal bed mobility: Modified Independent ?  ?  ?  ?  ?  ?  ?  ?  ? ?Transfers ?Overall transfer level: Needs assistance ?Equipment used: Rolling walker (2 wheels) ?Transfers: Bed to chair/wheelchair/BSC, Sit to/from Stand ?Sit to Stand: Supervision ?  ?  ?Step pivot transfers: Supervision ?  ?  ?General transfer comment: VC for safe hand placement ?  ? ?  ?Balance Overall balance assessment: Needs assistance ?  ?Sitting balance-Leahy Scale: Good ?  ?  ?Standing balance support: Single extremity supported ?Standing balance-Leahy Scale: Good ?  ?  ?  ?  ?  ?  ?  ?  ?  ?  ?  ?  ?   ? ?ADL either performed or assessed with clinical judgement  ? ?ADL Overall ADL's : Needs assistance/impaired ?  ?  ?  ?  ?  ?  ?  ?  ?  ?  ?  ?  ?  ?  ?  ?  ?  ?  ?  ?General ADL Comments: MOD I don/doff B socks sitting EOB, anticipate needing SUPERVISION for standing dressing tasks 2/2 mild impulsivity and safety awareness concerns. SUPERVISION + RW for BSC t/f and pericare in sitting. SBA + RW functional reaching task  ? ? ? ? ?Pertinent Vitals/Pain Pain Assessment ?Pain Assessment: Faces ?Faces Pain Scale: No hurt  ? ? ? ?Hand Dominance   ?  ?Extremity/Trunk Assessment Upper  Extremity Assessment ?Upper Extremity Assessment: Overall WFL for tasks assessed ?  ?Lower Extremity Assessment ?Lower Extremity Assessment: Generalized weakness ?  ?  ?  ?Communication Communication ?Communication: No difficulties ?  ?Cognition Arousal/Alertness: Awake/alert ?Behavior During Therapy: [facility] for tasks assessed/performed ?Overall Cognitive Status: Within Functional Limits for tasks assessed ?  ?  ?  ?  ?  ?  ?  ?  ?  ?  ?  ?  ?  ?  ?  ?  ?  ?  ?  ?    ?   ?   ? ? ?Home Living Family/patient expects to be discharged to:: Private residence ?Living Arrangements: Alone ?Available Help at Discharge: Family ?Type of Home: House ?Home Access: Stairs to enter ?Entrance Stairs-Number of Steps: 1 ?Entrance Stairs-Rails: None ?Home Layout: One level ?  ?  ?Bathroom Shower/Tub: Tub/shower unit ?  ?Bathroom Toilet: Handicapped height ?  ?  ?Home Equipment: Grab bars - tub/shower;Toilet riser ?  ?  ?  ? ?  ?Prior Functioning/Environment Prior Level of Function : Independent/Modified Independent ?  ?  ?  ?  ?  ?  ?Mobility Comments: Ind amb community distances without an AD, no fall history, mows yard with a push mower ?  ?  ? ?  ?  ?OT Problem List: Impaired balance (sitting and/or standing);Decreased safety awareness ?  ?   ?   ?OT Goals(Current goals can be found in the care plan section) Acute Rehab OT Goals ?Patient Stated Goal: Return to PLOF ?OT Goal Formulation: With patient ?Time For Goal Achievement: 11/29/21 ?Potential to Achieve Goals: Good  ? ?AM-PAC OT "6 Clicks" Daily Activity     ?Outcome Measure Help from another person eating meals?: None ?Help from another person taking care of personal grooming?: A Little ?Help from another person toileting, which includes using toliet, bedpan, or urinal?: A Little ?Help from another person bathing (including washing, rinsing, drying)?: A Little ?Help from another person to put on and taking off regular upper body clothing?: None ?Help from another person to put on and taking off regular lower body clothing?: None ?6 Click Score: 21 ?  ?End of Session Equipment Utilized During Treatment: Rolling walker (2 wheels) ? ?Activity Tolerance: Patient tolerated treatment well ?Patient left: in bed;with call bell/phone within reach;with bed alarm set;with SCD's reapplied ? ?OT Visit Diagnosis: Other abnormalities of gait and mobility (R26.89)  ?              ?Time: 8502-7741 ?OT Time Calculation (min): 16 min ?Charges:  OT General  Charges ?$OT Visit: 1 Visit ?OT Evaluation ?$OT Eval Low Complexity: 1 Low ? ?Lynn Coma, M.S. OTR/L  ?11/15/21, 12:44 PM  ?ascom [phone] ? ?

## 2021-11-15 NOTE — Discharge Summary (Signed)
Physician Discharge Summary  Patient ID: BRITTINEY DICOSTANZO MRN: [id number] DOB/AGE: 11-27-1940 81 y.o.  Admit date: 11/14/2021 Discharge date: 11/15/2021  Admission Diagnoses:  Total knee replacement status [Z96.659]  Surgeries:Procedure(s): Left total knee arthroplasty using computer-assisted navigation   SURGEON:  Marciano Sequin. M.D.   ASSISTANT: Cassell Smiles, PA-C (present and scrubbed throughout the case, critical for assistance with exposure, retraction, instrumentation, and closure)   ANESTHESIA: spinal   ESTIMATED BLOOD LOSS: 50 mL   FLUIDS REPLACED: 1000 mL of crystalloid   TOURNIQUET TIME: 80 minutes   DRAINS: 2 medium Hemovac drains   SOFT TISSUE RELEASES: Anterior cruciate ligament, posterior cruciate ligament, deep medial collateral ligament, patellofemoral ligament   IMPLANTS UTILIZED: DePuy Attune size 5 posterior stabilized femoral component (cemented), size 5 rotating platform tibial component (cemented), 35 mm medialized dome patella (cemented), and a 5 mm stabilized rotating platform polyethylene insert.  Discharge Diagnoses: Patient Active Problem List   Diagnosis Date Noted   Total knee replacement status 11/14/2021   Essential hypertension 04/17/2018   Presence of right artificial knee joint 12/12/2016   Allergic rhinitis 01/22/2015   Depression 12/29/2014   Fatigue 12/29/2014   Arthritis, degenerative 11/06/2013   History of repair of rotator cuff 11/06/2013   Insomnia 12/06/2009   Adjustment disorder with depressed mood 01/13/2009   Osteopenia 11/24/2008   Diverticulosis of colon without hemorrhage 10/29/2008   Obesity 10/29/2008   Osteoarthrosis 10/29/2008    Past Medical History:  Diagnosis Date   Anemia    Cancer ([place])    ovarian ca   Depression    History of cervical cancer 12/29/2014   stage 4, s/p complete hysterectomy    History of mumps as a child    Hyperlipidemia    Hypertension    Osteoarthrosis    knees   Osteopenia     Wears dentures    full upper and lower     Transfusion:    Consultants (if any):   Discharged Condition: Improved  Hospital Course: Lynn Mccoy is an 81 y.o. female who was admitted 11/14/2021 with a diagnosis of left knee osteoarthritis and went to the operating room on 11/14/2021 and underwent left total knee arthoplasty. The patient received perioperative antibiotics for prophylaxis (see below). The patient tolerated the procedure well and was transported to PACU in stable condition. After meeting PACU criteria, the patient was subsequently transferred to the Orthopaedics/Rehabilitation unit.   The patient received DVT prophylaxis in the form of early mobilization, Lovenox, TED hose, and SCDs . A sacral pad had been placed and heels were elevated off of the bed with rolled towels in order to protect skin integrity. Foley catheter was discontinued on postoperative day #0. Wound drains were discontinued on postoperative day #1. The surgical incision was healing well without signs of infection.  Physical therapy was initiated postoperatively for transfers, gait training, and strengthening. Occupational therapy was initiated for activities of daily living and evaluation for assisted devices. Rehabilitation goals were reviewed in detail with the patient. The patient made steady progress with physical therapy and physical therapy recommended discharge to Home.   The patient achieved the preliminary goals of this hospitalization and was felt to be medically and orthopaedically appropriate for discharge.  She was given perioperative antibiotics:  Anti-infectives (From admission, onward)    Start     Dose/Rate Route Frequency Ordered Stop   11/14/21 1815  ceFAZolin (ANCEF) IVPB 2g/100 mL premix        2 g 200  mL/hr over 30 Minutes Intravenous Every 6 hours 11/14/21 1626 11/15/21 0800   11/14/21 0930  ceFAZolin (ANCEF) IVPB 2g/100 mL premix        2 g 200 mL/hr over 30 Minutes Intravenous On  call to O.R. 11/14/21 7510 11/14/21 1227     .  Recent vital signs:  Vitals:   11/15/21 0731 11/15/21 1609  BP: (!) 115/39 (!) 118/45  Pulse: (!) 53 63  Resp: 16 17  Temp: (!) 97.5 F (36.4 C) 98.2 F (36.8 C)  SpO2: 94% 95%    Recent laboratory studies:  No results for input(s): WBC, HGB, HCT, PLT, K, CL, CO2, BUN, CREATININE, GLUCOSE, CALCIUM, LABPT, INR in the last 72 hours.  Diagnostic Studies: DG Knee Left Port  Result Date: 11/14/2021 CLINICAL DATA:  Postop left total knee replacement. EXAM: PORTABLE LEFT KNEE - 1-2 VIEW COMPARISON:  None Available. FINDINGS: Evidence of recent left total knee arthroplasty. Two surgical drains are in place. There are anterior skin staples. The hardware is well positioned. No evidence of acute fracture or dislocation. A small amount of gas is present within the joint and surrounding soft tissues. IMPRESSION: Recent left total knee arthroplasty without demonstrated complication. Electronically Signed   By: Richardean Sale M.D.   On: 11/14/2021 15:53    Discharge Medications:   Allergies as of 11/15/2021   No Active Allergies      Medication List     STOP taking these medications    aspirin 81 MG chewable tablet   naproxen sodium 220 MG tablet Commonly known as: ALEVE       TAKE these medications    amLODipine 2.5 MG tablet Commonly known as: NORVASC Take 1 tablet (2.5 mg total) by mouth daily.   celecoxib 200 MG capsule Commonly known as: CELEBREX Take 1 capsule (200 mg total) by mouth 2 (two) times daily.   cetirizine 10 MG tablet Commonly known as: ZYRTEC Take 10 mg by mouth daily.   cholecalciferol 1000 units tablet Commonly known as: VITAMIN D Take 1,000 Units by mouth daily.   diclofenac Sodium 1 % Gel Commonly known as: VOLTAREN Apply 1 application. topically at bedtime as needed (knee pain).   enoxaparin 40 MG/0.4ML injection Commonly known as: LOVENOX Inject 0.4 mLs (40 mg total) into the skin daily for 14  days.   montelukast 10 MG tablet Commonly known as: SINGULAIR Take 1 tablet (10 mg total) by mouth at bedtime.   NON FORMULARY Take 1 tablet by mouth daily. Neuriva OTC   NONFORMULARY OR COMPOUNDED ITEM Take 4 tablets by mouth daily. SeroVital- hgh   oxyCODONE 5 MG immediate release tablet Commonly known as: Oxy IR/ROXICODONE Take 1 tablet (5 mg total) by mouth every 4 (four) hours as needed for severe pain.   sertraline 100 MG tablet Commonly known as: ZOLOFT TAKE ONE TABLET EVERY DAY What changed:  when to take this reasons to take this   tiZANidine 4 MG tablet Commonly known as: Zanaflex Take 1 tablet (4 mg total) by mouth at bedtime. For stiff neck What changed:  when to take this reasons to take this additional instructions   traMADol 50 MG tablet Commonly known as: ULTRAM Take 1 tablet (50 mg total) by mouth every 4 (four) hours as needed for moderate pain.   traZODone 100 MG tablet Commonly known as: DESYREL TAKE ONE-HALF TO ONE TABLET ONE HOUR BEFORE BEDTIME. What changed: See the new instructions.  Durable Medical Equipment  (From admission, onward)           Start     Ordered   11/14/21 1529  DME Walker rolling  Once       Question:  Patient needs a walker to treat with the following condition  Answer:  Total knee replacement status   11/14/21 1528   11/14/21 1529  DME Bedside commode  Once       Question:  Patient needs a bedside commode to treat with the following condition  Answer:  Total knee replacement status   11/14/21 1528            Disposition: Home with home health PT     Follow-up Information     Fausto Skillern, PA-C Follow up on 11/29/2021.   Specialty: Orthopedic Surgery Why: at 1:15pm Contact information: [place] [address] [phone]         Dereck Leep, MD Follow up on 01/05/2022.   Specialty: Orthopedic  Surgery Why: at 11:15am Contact information: [place] [id number] [place], PA-C 11/15/2021, 4:41 PM

## 2021-11-15 NOTE — Progress Notes (Signed)
Physical Therapy Treatment ?Patient Details ?Name: Lynn Mccoy ?MRN: [id number] ?DOB: 04/24/1941 ?Today's Date: 11/15/2021 ? ? ?History of Present Illness Pt is a 81 yo F diagnosed with degenerative arthrosis of the left knee and is s/p elective L TKA.  PMH includes: depression, OA, osteopenia, and HTN. ? ?  ?PT Comments  ? ? Pt was pleasant and motivated to participate during the session and put forth good effort throughout. Pt required no physical assistance during the session and demonstrate good control and stability with transfers, gait, and stair training.  Pt reported no adverse symptoms during the session other than mod L knee pain with SpO2 and HR WNL on room air.  Pt will benefit from HHPT upon discharge to safely address deficits listed in patient problem list for decreased caregiver assistance and eventual return to PLOF. ? ?   ?Recommendations for follow up therapy are one component of a multi-disciplinary discharge planning process, led by the attending physician.  Recommendations may be updated based on patient status, additional functional criteria and insurance authorization. ? ?Follow Up Recommendations ? Home health PT ?  ?  ?Assistance Recommended at Discharge Frequent or constant Supervision/Assistance  ?Patient can return home with the following A little help with walking and/or transfers;A little help with bathing/dressing/bathroom;Assistance with cooking/housework;Help with stairs or ramp for entrance;Assist for transportation ?  ?Equipment Recommendations ? Rolling walker (2 wheels)  ?  ?Recommendations for Other Services   ? ? ?  ?Precautions / Restrictions Precautions ?Precautions: Fall ?Restrictions ?Weight Bearing Restrictions: Yes ?LLE Weight Bearing: Weight bearing as tolerated ?Other Position/Activity Restrictions: Pt able to perform independent LLE SLR without extensor lag, no KI required  ?  ? ?Mobility ? Bed Mobility ?  ?  ?  ?  ?  ?  ?  ?General bed mobility comments: NT, pt in  recliner ?  ? ?Transfers ?Overall transfer level: Needs assistance ?Equipment used: Rolling walker (2 wheels) ?Transfers: Sit to/from Stand ?Sit to Stand: Supervision ?  ?  ?  ?  ?  ?General transfer comment: Min verbal and visual cues for sequencing for L foot positioning and hand placement ?  ? ?Ambulation/Gait ?Ambulation/Gait assistance: Min guard ?Gait Distance (Feet): 100 Feet x 2 ?Assistive device: Rolling walker (2 wheels) ?Gait Pattern/deviations: Step-to pattern, Decreased stance time - left, Antalgic, Step-through pattern ?Gait velocity: decreased ?  ?  ?General Gait Details: Mildly antalgic step-to pattern that progressed to step-through pattern with good control and stability throughout; min verbal cues for general sequencing with the RW and for upright posture ? ? ?Stairs ?Stairs: Yes ?Stairs assistance: Min guard ?Stair Management: With walker, Backwards, Forwards, Step to pattern ?Number of Stairs: 1 ?General stair comments: Pt able to ascend backwards and descend forwards 1 step x 4 with good carryover of proper sequencing and good stability throughout ? ? ?Wheelchair Mobility ?  ? ?Modified Rankin (Stroke Patients Only) ?  ? ? ?  ?Balance Overall balance assessment: Needs assistance ?  ?Sitting balance-Leahy Scale: Good ?  ?  ?Standing balance support: Bilateral upper extremity supported, During functional activity ?Standing balance-Leahy Scale: Good ?  ?  ?  ?  ?  ?  ?  ?  ?  ?  ?  ?  ?  ? ?  ?Cognition Arousal/Alertness: Awake/alert ?Behavior During Therapy: [facility] for tasks assessed/performed ?Overall Cognitive Status: Within Functional Limits for tasks assessed ?  ?  ?  ?  ?  ?  ?  ?  ?  ?  ?  ?  ?  ?  ?  ?  ?  ?  ?  ? ?  ?  Exercises Total Joint Exercises ?Quad Sets: AROM, Strengthening, Left, 10 reps, 15 reps ?Long Arc Quad: AROM, Strengthening, Left, 10 reps, 15 reps ?Knee Flexion: AROM, Strengthening, Left, 10 reps, 15 reps ?Goniometric ROM: L knee AROM: 6-89 deg ?Marching in Standing: AROM,  Strengthening, Both, 5 reps, Standing ?Other Exercises ?Other Exercises: Positioning review to promote L knee ext PROM ? ?  ?General Comments   ?  ?  ? ?Pertinent Vitals/Pain Pain Assessment ?Pain Assessment: 0-10 ?Pain Score: 2  ?Pain Location: L knee ?Pain Descriptors / Indicators: Sore ?Pain Intervention(s): Repositioned, Premedicated before session, Monitored during session, Ice applied  ? ? ?Home Living   ?  ?  ?  ?  ?  ?  ?  ?  ?  ?   ?  ?Prior Function    ?  ?  ?   ? ?PT Goals (current goals can now be found in the care plan section) Progress towards PT goals: Progressing toward goals ? ?  ?Frequency ? ? ? BID ? ? ? ?  ?PT Plan Current plan remains appropriate  ? ? ?Co-evaluation   ?  ?  ?  ?  ? ?  ?AM-PAC PT "6 Clicks" Mobility   ?Outcome Measure ? Help needed turning from your back to your side while in a flat bed without using bedrails?: A Little ?Help needed moving from lying on your back to sitting on the side of a flat bed without using bedrails?: A Little ?Help needed moving to and from a bed to a chair (including a wheelchair)?: A Little ?Help needed standing up from a chair using your arms (e.g., wheelchair or bedside chair)?: A Little ?Help needed to walk in hospital room?: A Little ?Help needed climbing 3-5 steps with a railing? : A Little ?6 Click Score: 18 ? ?  ?End of Session Equipment Utilized During Treatment: Gait belt ?Activity Tolerance: Patient tolerated treatment well ?Patient left: in chair;with call bell/phone within reach;with chair alarm set;with SCD's reapplied;Other (comment) (polar care donned to L knee) ?Nurse Communication: Mobility status;Weight bearing status ?PT Visit Diagnosis: Other abnormalities of gait and mobility (R26.89);Muscle weakness (generalized) (M62.81);Pain ?Pain - Right/Left: Left ?Pain - part of body: Knee ?  ? ? ?Time: 3710-6269 ?PT Time Calculation (min) (ACUTE ONLY): 27 min ? ?Charges:  $Gait Training: 8-22 mins ?$Therapeutic Exercise: 8-22 mins           ?          ?D. Royetta Asal PT, DPT ?11/15/21, 11:29 AM ? ? ?

## 2021-11-15 NOTE — Progress Notes (Signed)
?  Subjective: ?1 Day Post-Op Procedure(s) (LRB): ?COMPUTER ASSISTED TOTAL KNEE ARTHROPLASTY (Left) ?Patient reports pain as well-controlled.   ?Patient is well, and has had no acute complaints or problems ?Plan is to go Home after hospital stay. ?Negative for chest pain and shortness of breath ?Fever: no ?Gastrointestinal: negative for nausea and vomiting.  ? ?Objective: ?Vital signs in last 24 hours: ?Temp:  [97 ?F (36.1 ?C)-98.7 ?F (37.1 ?C)] 97.5 ?F (36.4 ?C) (05/16 0569) ?Pulse Rate:  [50-88] 53 (05/16 0731) ?Resp:  [15-18] 16 (05/16 0731) ?BP: (108-142)/(39-86) 115/39 (05/16 0731) ?SpO2:  [87 %-97 %] 94 % (05/16 0731) ?Weight:  [73.5 kg] 73.5 kg (05/15 1027) ? ?Intake/Output from previous day: ? ?Intake/Output Summary (Last 24 hours) at 11/15/2021 0828 ?Last data filed at 11/15/2021 0458 ?Gross per 24 hour  ?Intake 2040 ml  ?Output 1110 ml  ?Net 930 ml  ?  ?Intake/Output this shift: ?No intake/output data recorded. ? ?Labs: ?No results for input(s): HGB in the last 72 hours. ?No results for input(s): WBC, RBC, HCT, PLT in the last 72 hours. ?No results for input(s): NA, K, CL, CO2, BUN, CREATININE, GLUCOSE, CALCIUM in the last 72 hours. ?No results for input(s): LABPT, INR in the last 72 hours. ? ? ?EXAM ?General - Patient is Alert, Appropriate, and Oriented ?Extremity - Neurovascular intact ?Dorsiflexion/Plantar flexion intact ?Compartment soft ?Dressing/Incision -Postoperative dressing remains in place., Polar Care in place and working. , Hemovac in place. , Following removal of post-op dressing, mild bloody drainage noted distally ?Motor Function - intact, moving foot and toes well on exam.    ?Cardiovascular- Regular rate and rhythm, no murmurs/rubs/gallops ?Respiratory- Lungs clear to auscultation bilaterally ?Gastrointestinal- soft, nontender, and active bowel sounds ? ? ?Assessment/Plan: ?1 Day Post-Op Procedure(s) (LRB): ?COMPUTER ASSISTED TOTAL KNEE ARTHROPLASTY (Left) ?Principal Problem: ?  Total knee  replacement status ? ?Estimated body mass index is 25.37 kg/m? as calculated from the following: ?  Height as of this encounter: '5\' 7"'$  (1.702 m). ?  Weight as of this encounter: 73.5 kg. ?Advance diet ?Up with therapy ?Discharge home with home health pending completion of PT goals  ? ? ? ?Post-op dressing removed. , Hemovac removed., Mini compression dressing applied. , and Fresh honeycomb dressing applied.  ? ?DVT Prophylaxis - Lovenox, Ted hose, and SCDs ?Weight-Bearing as tolerated to left leg ? ?Cassell Smiles, PA-C ?[facility] Orthopaedic Surgery ?11/15/2021, 8:28 AM ? ?

## 2021-11-16 DIAGNOSIS — J302 Other seasonal allergic rhinitis: Secondary | ICD-10-CM | POA: Diagnosis not present

## 2021-11-16 DIAGNOSIS — Z7901 Long term (current) use of anticoagulants: Secondary | ICD-10-CM | POA: Diagnosis not present

## 2021-11-16 DIAGNOSIS — Z471 Aftercare following joint replacement surgery: Secondary | ICD-10-CM | POA: Diagnosis not present

## 2021-11-16 DIAGNOSIS — M8580 Other specified disorders of bone density and structure, unspecified site: Secondary | ICD-10-CM | POA: Diagnosis not present

## 2021-11-16 DIAGNOSIS — F32A Depression, unspecified: Secondary | ICD-10-CM | POA: Diagnosis not present

## 2021-11-16 DIAGNOSIS — E785 Hyperlipidemia, unspecified: Secondary | ICD-10-CM | POA: Diagnosis not present

## 2021-11-16 DIAGNOSIS — I10 Essential (primary) hypertension: Secondary | ICD-10-CM | POA: Diagnosis not present

## 2021-11-16 DIAGNOSIS — Z8619 Personal history of other infectious and parasitic diseases: Secondary | ICD-10-CM | POA: Diagnosis not present

## 2021-11-16 DIAGNOSIS — Z96653 Presence of artificial knee joint, bilateral: Secondary | ICD-10-CM | POA: Diagnosis not present

## 2021-11-16 DIAGNOSIS — D649 Anemia, unspecified: Secondary | ICD-10-CM | POA: Diagnosis not present

## 2021-11-16 DIAGNOSIS — Z791 Long term (current) use of non-steroidal anti-inflammatories (NSAID): Secondary | ICD-10-CM | POA: Diagnosis not present

## 2021-11-16 DIAGNOSIS — N390 Urinary tract infection, site not specified: Secondary | ICD-10-CM | POA: Diagnosis not present

## 2021-11-16 DIAGNOSIS — G4700 Insomnia, unspecified: Secondary | ICD-10-CM | POA: Diagnosis not present

## 2021-11-16 DIAGNOSIS — K5790 Diverticulosis of intestine, part unspecified, without perforation or abscess without bleeding: Secondary | ICD-10-CM | POA: Diagnosis not present

## 2021-11-18 DIAGNOSIS — F32A Depression, unspecified: Secondary | ICD-10-CM | POA: Diagnosis not present

## 2021-11-18 DIAGNOSIS — K5790 Diverticulosis of intestine, part unspecified, without perforation or abscess without bleeding: Secondary | ICD-10-CM | POA: Diagnosis not present

## 2021-11-18 DIAGNOSIS — J302 Other seasonal allergic rhinitis: Secondary | ICD-10-CM | POA: Diagnosis not present

## 2021-11-18 DIAGNOSIS — G4700 Insomnia, unspecified: Secondary | ICD-10-CM | POA: Diagnosis not present

## 2021-11-18 DIAGNOSIS — E785 Hyperlipidemia, unspecified: Secondary | ICD-10-CM | POA: Diagnosis not present

## 2021-11-18 DIAGNOSIS — Z96653 Presence of artificial knee joint, bilateral: Secondary | ICD-10-CM | POA: Diagnosis not present

## 2021-11-18 DIAGNOSIS — M8580 Other specified disorders of bone density and structure, unspecified site: Secondary | ICD-10-CM | POA: Diagnosis not present

## 2021-11-18 DIAGNOSIS — Z791 Long term (current) use of non-steroidal anti-inflammatories (NSAID): Secondary | ICD-10-CM | POA: Diagnosis not present

## 2021-11-18 DIAGNOSIS — Z7901 Long term (current) use of anticoagulants: Secondary | ICD-10-CM | POA: Diagnosis not present

## 2021-11-18 DIAGNOSIS — N390 Urinary tract infection, site not specified: Secondary | ICD-10-CM | POA: Diagnosis not present

## 2021-11-18 DIAGNOSIS — Z471 Aftercare following joint replacement surgery: Secondary | ICD-10-CM | POA: Diagnosis not present

## 2021-11-18 DIAGNOSIS — Z8619 Personal history of other infectious and parasitic diseases: Secondary | ICD-10-CM | POA: Diagnosis not present

## 2021-11-18 DIAGNOSIS — D649 Anemia, unspecified: Secondary | ICD-10-CM | POA: Diagnosis not present

## 2021-11-18 DIAGNOSIS — I10 Essential (primary) hypertension: Secondary | ICD-10-CM | POA: Diagnosis not present

## 2021-11-19 DIAGNOSIS — Z8619 Personal history of other infectious and parasitic diseases: Secondary | ICD-10-CM | POA: Diagnosis not present

## 2021-11-19 DIAGNOSIS — Z96653 Presence of artificial knee joint, bilateral: Secondary | ICD-10-CM | POA: Diagnosis not present

## 2021-11-19 DIAGNOSIS — J302 Other seasonal allergic rhinitis: Secondary | ICD-10-CM | POA: Diagnosis not present

## 2021-11-19 DIAGNOSIS — M8580 Other specified disorders of bone density and structure, unspecified site: Secondary | ICD-10-CM | POA: Diagnosis not present

## 2021-11-19 DIAGNOSIS — Z7901 Long term (current) use of anticoagulants: Secondary | ICD-10-CM | POA: Diagnosis not present

## 2021-11-19 DIAGNOSIS — G4700 Insomnia, unspecified: Secondary | ICD-10-CM | POA: Diagnosis not present

## 2021-11-19 DIAGNOSIS — E785 Hyperlipidemia, unspecified: Secondary | ICD-10-CM | POA: Diagnosis not present

## 2021-11-19 DIAGNOSIS — I10 Essential (primary) hypertension: Secondary | ICD-10-CM | POA: Diagnosis not present

## 2021-11-19 DIAGNOSIS — F32A Depression, unspecified: Secondary | ICD-10-CM | POA: Diagnosis not present

## 2021-11-19 DIAGNOSIS — Z791 Long term (current) use of non-steroidal anti-inflammatories (NSAID): Secondary | ICD-10-CM | POA: Diagnosis not present

## 2021-11-19 DIAGNOSIS — D649 Anemia, unspecified: Secondary | ICD-10-CM | POA: Diagnosis not present

## 2021-11-19 DIAGNOSIS — K5790 Diverticulosis of intestine, part unspecified, without perforation or abscess without bleeding: Secondary | ICD-10-CM | POA: Diagnosis not present

## 2021-11-19 DIAGNOSIS — Z471 Aftercare following joint replacement surgery: Secondary | ICD-10-CM | POA: Diagnosis not present

## 2021-11-19 DIAGNOSIS — N390 Urinary tract infection, site not specified: Secondary | ICD-10-CM | POA: Diagnosis not present

## 2021-11-21 ENCOUNTER — Other Ambulatory Visit: Payer: Self-pay | Admitting: Family Medicine

## 2021-11-21 DIAGNOSIS — Z7901 Long term (current) use of anticoagulants: Secondary | ICD-10-CM | POA: Diagnosis not present

## 2021-11-21 DIAGNOSIS — G4700 Insomnia, unspecified: Secondary | ICD-10-CM | POA: Diagnosis not present

## 2021-11-21 DIAGNOSIS — Z791 Long term (current) use of non-steroidal anti-inflammatories (NSAID): Secondary | ICD-10-CM | POA: Diagnosis not present

## 2021-11-21 DIAGNOSIS — Z471 Aftercare following joint replacement surgery: Secondary | ICD-10-CM | POA: Diagnosis not present

## 2021-11-21 DIAGNOSIS — J302 Other seasonal allergic rhinitis: Secondary | ICD-10-CM | POA: Diagnosis not present

## 2021-11-21 DIAGNOSIS — F32A Depression, unspecified: Secondary | ICD-10-CM | POA: Diagnosis not present

## 2021-11-21 DIAGNOSIS — I10 Essential (primary) hypertension: Secondary | ICD-10-CM | POA: Diagnosis not present

## 2021-11-21 DIAGNOSIS — E785 Hyperlipidemia, unspecified: Secondary | ICD-10-CM | POA: Diagnosis not present

## 2021-11-21 DIAGNOSIS — N390 Urinary tract infection, site not specified: Secondary | ICD-10-CM | POA: Diagnosis not present

## 2021-11-21 DIAGNOSIS — M436 Torticollis: Secondary | ICD-10-CM

## 2021-11-21 DIAGNOSIS — M8580 Other specified disorders of bone density and structure, unspecified site: Secondary | ICD-10-CM | POA: Diagnosis not present

## 2021-11-21 DIAGNOSIS — Z96653 Presence of artificial knee joint, bilateral: Secondary | ICD-10-CM | POA: Diagnosis not present

## 2021-11-21 DIAGNOSIS — K5790 Diverticulosis of intestine, part unspecified, without perforation or abscess without bleeding: Secondary | ICD-10-CM | POA: Diagnosis not present

## 2021-11-21 DIAGNOSIS — D649 Anemia, unspecified: Secondary | ICD-10-CM | POA: Diagnosis not present

## 2021-11-21 DIAGNOSIS — Z8619 Personal history of other infectious and parasitic diseases: Secondary | ICD-10-CM | POA: Diagnosis not present

## 2021-11-23 ENCOUNTER — Ambulatory Visit: Payer: Medicare Other | Admitting: Family Medicine

## 2021-11-23 DIAGNOSIS — Z7901 Long term (current) use of anticoagulants: Secondary | ICD-10-CM | POA: Diagnosis not present

## 2021-11-23 DIAGNOSIS — Z8619 Personal history of other infectious and parasitic diseases: Secondary | ICD-10-CM | POA: Diagnosis not present

## 2021-11-23 DIAGNOSIS — Z791 Long term (current) use of non-steroidal anti-inflammatories (NSAID): Secondary | ICD-10-CM | POA: Diagnosis not present

## 2021-11-23 DIAGNOSIS — I10 Essential (primary) hypertension: Secondary | ICD-10-CM | POA: Diagnosis not present

## 2021-11-23 DIAGNOSIS — N390 Urinary tract infection, site not specified: Secondary | ICD-10-CM | POA: Diagnosis not present

## 2021-11-23 DIAGNOSIS — K5790 Diverticulosis of intestine, part unspecified, without perforation or abscess without bleeding: Secondary | ICD-10-CM | POA: Diagnosis not present

## 2021-11-23 DIAGNOSIS — Z471 Aftercare following joint replacement surgery: Secondary | ICD-10-CM | POA: Diagnosis not present

## 2021-11-23 DIAGNOSIS — M8580 Other specified disorders of bone density and structure, unspecified site: Secondary | ICD-10-CM | POA: Diagnosis not present

## 2021-11-23 DIAGNOSIS — Z96653 Presence of artificial knee joint, bilateral: Secondary | ICD-10-CM | POA: Diagnosis not present

## 2021-11-23 DIAGNOSIS — D649 Anemia, unspecified: Secondary | ICD-10-CM | POA: Diagnosis not present

## 2021-11-23 DIAGNOSIS — F32A Depression, unspecified: Secondary | ICD-10-CM | POA: Diagnosis not present

## 2021-11-23 DIAGNOSIS — E785 Hyperlipidemia, unspecified: Secondary | ICD-10-CM | POA: Diagnosis not present

## 2021-11-23 DIAGNOSIS — G4700 Insomnia, unspecified: Secondary | ICD-10-CM | POA: Diagnosis not present

## 2021-11-23 DIAGNOSIS — J302 Other seasonal allergic rhinitis: Secondary | ICD-10-CM | POA: Diagnosis not present

## 2021-11-25 DIAGNOSIS — Z96653 Presence of artificial knee joint, bilateral: Secondary | ICD-10-CM | POA: Diagnosis not present

## 2021-11-25 DIAGNOSIS — J302 Other seasonal allergic rhinitis: Secondary | ICD-10-CM | POA: Diagnosis not present

## 2021-11-25 DIAGNOSIS — Z471 Aftercare following joint replacement surgery: Secondary | ICD-10-CM | POA: Diagnosis not present

## 2021-11-25 DIAGNOSIS — Z8619 Personal history of other infectious and parasitic diseases: Secondary | ICD-10-CM | POA: Diagnosis not present

## 2021-11-25 DIAGNOSIS — Z791 Long term (current) use of non-steroidal anti-inflammatories (NSAID): Secondary | ICD-10-CM | POA: Diagnosis not present

## 2021-11-25 DIAGNOSIS — E785 Hyperlipidemia, unspecified: Secondary | ICD-10-CM | POA: Diagnosis not present

## 2021-11-25 DIAGNOSIS — I10 Essential (primary) hypertension: Secondary | ICD-10-CM | POA: Diagnosis not present

## 2021-11-25 DIAGNOSIS — M8580 Other specified disorders of bone density and structure, unspecified site: Secondary | ICD-10-CM | POA: Diagnosis not present

## 2021-11-25 DIAGNOSIS — N390 Urinary tract infection, site not specified: Secondary | ICD-10-CM | POA: Diagnosis not present

## 2021-11-25 DIAGNOSIS — F32A Depression, unspecified: Secondary | ICD-10-CM | POA: Diagnosis not present

## 2021-11-25 DIAGNOSIS — D649 Anemia, unspecified: Secondary | ICD-10-CM | POA: Diagnosis not present

## 2021-11-25 DIAGNOSIS — Z7901 Long term (current) use of anticoagulants: Secondary | ICD-10-CM | POA: Diagnosis not present

## 2021-11-25 DIAGNOSIS — K5790 Diverticulosis of intestine, part unspecified, without perforation or abscess without bleeding: Secondary | ICD-10-CM | POA: Diagnosis not present

## 2021-11-25 DIAGNOSIS — G4700 Insomnia, unspecified: Secondary | ICD-10-CM | POA: Diagnosis not present

## 2021-11-29 DIAGNOSIS — Z7901 Long term (current) use of anticoagulants: Secondary | ICD-10-CM | POA: Diagnosis not present

## 2021-11-29 DIAGNOSIS — Z96653 Presence of artificial knee joint, bilateral: Secondary | ICD-10-CM | POA: Diagnosis not present

## 2021-11-29 DIAGNOSIS — Z8619 Personal history of other infectious and parasitic diseases: Secondary | ICD-10-CM | POA: Diagnosis not present

## 2021-11-29 DIAGNOSIS — N390 Urinary tract infection, site not specified: Secondary | ICD-10-CM | POA: Diagnosis not present

## 2021-11-29 DIAGNOSIS — D649 Anemia, unspecified: Secondary | ICD-10-CM | POA: Diagnosis not present

## 2021-11-29 DIAGNOSIS — Z791 Long term (current) use of non-steroidal anti-inflammatories (NSAID): Secondary | ICD-10-CM | POA: Diagnosis not present

## 2021-11-29 DIAGNOSIS — K5790 Diverticulosis of intestine, part unspecified, without perforation or abscess without bleeding: Secondary | ICD-10-CM | POA: Diagnosis not present

## 2021-11-29 DIAGNOSIS — E785 Hyperlipidemia, unspecified: Secondary | ICD-10-CM | POA: Diagnosis not present

## 2021-11-29 DIAGNOSIS — F32A Depression, unspecified: Secondary | ICD-10-CM | POA: Diagnosis not present

## 2021-11-29 DIAGNOSIS — G4700 Insomnia, unspecified: Secondary | ICD-10-CM | POA: Diagnosis not present

## 2021-11-29 DIAGNOSIS — M8580 Other specified disorders of bone density and structure, unspecified site: Secondary | ICD-10-CM | POA: Diagnosis not present

## 2021-11-29 DIAGNOSIS — I10 Essential (primary) hypertension: Secondary | ICD-10-CM | POA: Diagnosis not present

## 2021-11-29 DIAGNOSIS — Z471 Aftercare following joint replacement surgery: Secondary | ICD-10-CM | POA: Diagnosis not present

## 2021-11-29 DIAGNOSIS — J302 Other seasonal allergic rhinitis: Secondary | ICD-10-CM | POA: Diagnosis not present

## 2021-11-30 DIAGNOSIS — Z96652 Presence of left artificial knee joint: Secondary | ICD-10-CM | POA: Diagnosis not present

## 2021-11-30 DIAGNOSIS — M25562 Pain in left knee: Secondary | ICD-10-CM | POA: Diagnosis not present

## 2021-12-02 DIAGNOSIS — M25562 Pain in left knee: Secondary | ICD-10-CM | POA: Diagnosis not present

## 2021-12-02 DIAGNOSIS — Z96652 Presence of left artificial knee joint: Secondary | ICD-10-CM | POA: Diagnosis not present

## 2021-12-05 DIAGNOSIS — Z96652 Presence of left artificial knee joint: Secondary | ICD-10-CM | POA: Diagnosis not present

## 2021-12-05 DIAGNOSIS — M25562 Pain in left knee: Secondary | ICD-10-CM | POA: Diagnosis not present

## 2021-12-07 ENCOUNTER — Other Ambulatory Visit: Payer: Self-pay | Admitting: Family Medicine

## 2021-12-07 DIAGNOSIS — I10 Essential (primary) hypertension: Secondary | ICD-10-CM

## 2021-12-09 DIAGNOSIS — M25562 Pain in left knee: Secondary | ICD-10-CM | POA: Diagnosis not present

## 2021-12-09 DIAGNOSIS — Z96652 Presence of left artificial knee joint: Secondary | ICD-10-CM | POA: Diagnosis not present

## 2021-12-14 DIAGNOSIS — M25562 Pain in left knee: Secondary | ICD-10-CM | POA: Diagnosis not present

## 2021-12-14 DIAGNOSIS — Z96652 Presence of left artificial knee joint: Secondary | ICD-10-CM | POA: Diagnosis not present

## 2021-12-16 DIAGNOSIS — Z96652 Presence of left artificial knee joint: Secondary | ICD-10-CM | POA: Diagnosis not present

## 2021-12-16 DIAGNOSIS — M25562 Pain in left knee: Secondary | ICD-10-CM | POA: Diagnosis not present

## 2021-12-20 DIAGNOSIS — Z96652 Presence of left artificial knee joint: Secondary | ICD-10-CM | POA: Diagnosis not present

## 2021-12-20 DIAGNOSIS — M25562 Pain in left knee: Secondary | ICD-10-CM | POA: Diagnosis not present

## 2021-12-23 DIAGNOSIS — Z96652 Presence of left artificial knee joint: Secondary | ICD-10-CM | POA: Diagnosis not present

## 2021-12-23 DIAGNOSIS — M25562 Pain in left knee: Secondary | ICD-10-CM | POA: Diagnosis not present

## 2021-12-27 DIAGNOSIS — Z96652 Presence of left artificial knee joint: Secondary | ICD-10-CM | POA: Diagnosis not present

## 2021-12-27 DIAGNOSIS — M25562 Pain in left knee: Secondary | ICD-10-CM | POA: Diagnosis not present

## 2022-01-04 DIAGNOSIS — Z96652 Presence of left artificial knee joint: Secondary | ICD-10-CM | POA: Diagnosis not present

## 2022-01-04 DIAGNOSIS — M25562 Pain in left knee: Secondary | ICD-10-CM | POA: Diagnosis not present

## 2022-01-05 DIAGNOSIS — Z96652 Presence of left artificial knee joint: Secondary | ICD-10-CM | POA: Diagnosis not present

## 2022-04-26 ENCOUNTER — Encounter: Payer: Self-pay | Admitting: Family Medicine

## 2022-04-26 ENCOUNTER — Ambulatory Visit (INDEPENDENT_AMBULATORY_CARE_PROVIDER_SITE_OTHER): Payer: Medicare Other | Admitting: Family Medicine

## 2022-04-26 VITALS — BP 148/62 | HR 52 | Temp 97.8°F | Resp 14 | Ht 67.01 in | Wt 164.5 lb

## 2022-04-26 DIAGNOSIS — I10 Essential (primary) hypertension: Secondary | ICD-10-CM

## 2022-04-26 DIAGNOSIS — Z23 Encounter for immunization: Secondary | ICD-10-CM | POA: Diagnosis not present

## 2022-04-26 DIAGNOSIS — F3289 Other specified depressive episodes: Secondary | ICD-10-CM

## 2022-04-26 DIAGNOSIS — Z0000 Encounter for general adult medical examination without abnormal findings: Secondary | ICD-10-CM | POA: Diagnosis not present

## 2022-04-26 DIAGNOSIS — G4700 Insomnia, unspecified: Secondary | ICD-10-CM | POA: Diagnosis not present

## 2022-04-26 DIAGNOSIS — R011 Cardiac murmur, unspecified: Secondary | ICD-10-CM | POA: Diagnosis not present

## 2022-04-26 NOTE — Progress Notes (Signed)
I,Roshena L Chambers,acting as a scribe for [name], MD.,have documented all relevant documentation on the behalf of [name], MD,as directed by  [name], MD while in the presence of [name], MD.   Complete Physical Exam      Patient: Lynn Mccoy, Female    DOB: Feb 07, 1941, 81 y.o.   MRN: [id number] Visit Date: 04/26/2022  Today's Provider: [name], MD   Chief Complaint  Patient presents with   Medicare Wellness   Hypertension   Depression   Subjective    Lynn Mccoy is a 81 y.o. female who presents today for her complete physical examination. She reports consuming a general diet. Home exercise routine includes walking daily. She generally feels fairly well. She reports sleeping fairly well. She does not have additional problems to discuss today.   HPI Hypertension, follow-up  BP Readings from Last 3 Encounters:  04/26/22 (!) 167/51  11/15/21 (!) 118/45  08/29/21 (!) 151/57   Wt Readings from Last 3 Encounters:  04/26/22 164 lb 8 oz (74.6 kg)  11/14/21 162 lb (73.5 kg)  08/29/21 168 lb (76.2 kg)     She was last seen for hypertension 8 months ago.  BP at that visit was 151/57. Management since that visit includes adding amLODipine (NORVASC) 2.5 MG tablet; Take 1 tablet (2.5 mg total) by mouth daily. HCTZ was later put on hold due to low home blood pressure readings and dizziness.  She reports good compliance with treatment, although she did not take amlodipine this morning. She is not having side effects.  She is following a Regular diet. She is exercising. She does not smoke.  Use of agents associated with hypertension: none.   Outside blood pressures are 130/70's. Symptoms: No chest pain No chest pressure  No palpitations No syncope  No dyspnea No orthopnea  No paroxysmal nocturnal dyspnea No lower extremity edema   Pertinent labs Lab Results  Component Value Date   CHOL 150 04/17/2018   HDL 53 04/17/2018   LDLCALC 85  04/17/2018   TRIG 58 04/17/2018   CHOLHDL 2.8 04/17/2018   Lab Results  Component Value Date   NA 138 11/03/2021   K 3.8 11/03/2021   CREATININE 0.86 11/03/2021   GFRNONAA >60 11/03/2021   GLUCOSE 103 (H) 11/03/2021   TSH 1.080 04/25/2021     The ASCVD Risk score (Arnett DK, et al., 2019) failed to calculate for the following reasons:   The 2019 ASCVD risk score is only valid for ages 67 to 52  ---------------------------------------------------------------------------------------------------   Depression, Follow-up  She  was last seen for this 1  year  ago. Changes made at last visit include none; continue same medication.   She reports fair compliance with treatment. She does not take sertraline daily as prescribed. Patient reports taking only 1/2 tablet three times a week. She is not having side effects.   She reports good tolerance of treatment. Current symptoms include: depressed mood She feels she is Unchanged since last visit.     04/26/2022    9:22 AM 08/29/2021    8:34 AM 04/25/2021    9:12 AM  Depression screen PHQ 2/9  Decreased Interest 0 0 0  Down, Depressed, Hopeless '1 1 1  '$ PHQ - 2 Score '1 1 1  '$ Altered sleeping 0 0 0  Tired, decreased energy 0 1 2  Change in appetite 0 1 0  Feeling bad or failure about yourself  2 0 1  Trouble concentrating  0 0 0  Moving slowly or fidgety/restless 0 1 0  Suicidal thoughts 1 0 0  PHQ-9 Score '4 4 4  '$ Difficult doing work/chores Not difficult at all Not difficult at all Not difficult at all    -----------------------------------------------------------------------------------------    Medications: Outpatient Medications Prior to Visit  Medication Sig   amLODipine (NORVASC) 2.5 MG tablet Take 1 tablet (2.5 mg total) by mouth daily.   celecoxib (CELEBREX) 100 MG capsule Take 100 mg by mouth 2 (two) times daily.   cetirizine (ZYRTEC) 10 MG tablet Take 10 mg by mouth daily.   cholecalciferol (VITAMIN D) 1000 UNITS tablet  Take 1,000 Units by mouth daily.   diclofenac Sodium (VOLTAREN) 1 % GEL Apply 1 application. topically at bedtime as needed (knee pain).   montelukast (SINGULAIR) 10 MG tablet Take 1 tablet (10 mg total) by mouth at bedtime.   NON FORMULARY Take 1 tablet by mouth daily. Neuriva OTC   NONFORMULARY OR COMPOUNDED ITEM Take 4 tablets by mouth daily. SeroVital- hgh   sertraline (ZOLOFT) 100 MG tablet TAKE ONE TABLET EVERY DAY (Patient taking differently: Take 100 mg by mouth daily as needed (depression).)   tiZANidine (ZANAFLEX) 4 MG tablet Take 1 tablet (4 mg total) by mouth at bedtime. For stiff neck   traMADol (ULTRAM) 50 MG tablet Take 1 tablet (50 mg total) by mouth every 4 (four) hours as needed for moderate pain.   traZODone (DESYREL) 100 MG tablet TAKE ONE-HALF TO ONE TABLET ONE HOUR BEFORE BEDTIME. (Patient taking differently: Take 50-100 mg by mouth at bedtime.)   enoxaparin (LOVENOX) 40 MG/0.4ML injection Inject 0.4 mLs (40 mg total) into the skin daily for 14 days.   oxyCODONE (OXY IR/ROXICODONE) 5 MG immediate release tablet Take 1 tablet (5 mg total) by mouth every 4 (four) hours as needed for severe pain.   [DISCONTINUED] celecoxib (CELEBREX) 200 MG capsule Take 1 capsule (200 mg total) by mouth 2 (two) times daily.   No facility-administered medications prior to visit.    No Active Allergies  Patient Care Team: Birdie Sons, MD as PCP - General (Family Medicine) Emmaline Kluver., MD (Rheumatology) Leandrew Koyanagi, MD as Referring Physician (Ophthalmology)  Review of Systems  Constitutional:  Negative for chills, fatigue and fever.  HENT:  Positive for drooling and sneezing. Negative for congestion, ear pain, rhinorrhea and sore throat.   Eyes: Negative.  Negative for pain and redness.  Respiratory:  Negative for cough, shortness of breath and wheezing.   Cardiovascular:  Negative for chest pain and leg swelling.  Gastrointestinal:  Positive for abdominal  distention. Negative for abdominal pain, blood in stool, constipation, diarrhea and nausea.  Endocrine: Negative for polydipsia and polyphagia.  Genitourinary:  Positive for frequency. Negative for dysuria, flank pain, hematuria, pelvic pain, vaginal bleeding and vaginal discharge.  Musculoskeletal:  Positive for arthralgias. Negative for back pain, gait problem and joint swelling.  Skin:  Negative for rash.  Neurological:  Positive for numbness. Negative for dizziness, tremors, seizures, weakness, light-headedness and headaches.  Hematological:  Negative for adenopathy.  Psychiatric/Behavioral:  Positive for agitation. Negative for behavioral problems, confusion and dysphoric mood. The patient is not nervous/anxious and is not hyperactive.         Objective    Vitals: BP (!) 167/51 (BP Location: Right Arm, Patient Position: Sitting, Cuff Size: Normal)   Pulse (!) 52   Temp 97.8 F (36.6 C) (Oral)   Resp 14   Ht 5' 7.01" (1.702 m)  Wt 164 lb 8 oz (74.6 kg)   SpO2 100% Comment: room air  BMI 25.76 kg/m     Physical Exam  General Appearance:    Well developed, well nourished female. Alert, cooperative, in no acute distress, appears stated age   Head:    Normocephalic, without obvious abnormality, atraumatic  Eyes:    PERRL, conjunctiva/corneas clear, EOM's intact, fundi    benign, both eyes  Ears:    Normal TM's and external ear canals, both ears  Nose:   Nares normal, septum midline, mucosa normal, no drainage    or sinus tenderness  Throat:   Lips, mucosa, and tongue normal; teeth and gums normal  Neck:   Supple, symmetrical, trachea midline, no adenopathy;    thyroid:  no enlargement/tenderness/nodules; no carotid   bruit or JVD  Back:     Symmetric, no curvature, ROM normal, no CVA tenderness  Lungs:     Clear to auscultation bilaterally, respirations unlabored  Chest Wall:    No tenderness or deformity   Heart:    Bradycardic. Normal rhythm. III/VI systolic murmur RUSB,  No rubs, or gallops.   Breast Exam:    deferred  Abdomen:     Soft, non-tender, bowel sounds active all four quadrants,    no masses, no organomegaly  Pelvic:    deferred  Extremities:   All extremities are intact. No cyanosis or edema  Pulses:   2+ and symmetric all extremities  Skin:   Skin color, texture, turgor normal, no rashes or lesions  Lymph nodes:   Cervical, supraclavicular, and axillary nodes normal  Neurologic:   CNII-XII intact, normal strength, sensation and reflexes    throughout      Assessment & Plan     1. Annual physical exam Generally doing well.   2. Heart murmur, systolic Asymptomatic, but not = appreciated on prior exams.  - ECHOCARDIOGRAM COMPLETE; Future  3. Essential hypertension Usually better controlled, she did not take her medications today and reports home SBP typically in the low 130s.  - Comprehensive metabolic panel - Lipid panel - CBC  4. Need for immunization against influenza  - Flu Vaccine QUAD High Dose(Fluad)  5. Other depression Doing well with sertraline and wishes to continue unchanged.   6. Insomnia, unspecified type Doing well with trazodone and wishes to continue current dosage.      The entirety of the information documented in the History of Present Illness, Review of Systems and Physical Exam were personally obtained by me. Portions of this information were initially documented by the CMA and reviewed by me for thoroughness and accuracy.     [name], MD  [facility] [phone] (phone) [phone] (fax)  [place]

## 2022-04-27 LAB — COMPREHENSIVE METABOLIC PANEL
ALT: 16 IU/L (ref 0–32)
AST: 21 IU/L (ref 0–40)
Albumin/Globulin Ratio: 1.8 (ref 1.2–2.2)
Albumin: 4.4 g/dL (ref 3.7–4.7)
Alkaline Phosphatase: 63 IU/L (ref 44–121)
BUN/Creatinine Ratio: 21 (ref 12–28)
BUN: 16 mg/dL (ref 8–27)
Bilirubin Total: 0.2 mg/dL (ref 0.0–1.2)
CO2: 21 mmol/L (ref 20–29)
Calcium: 9.3 mg/dL (ref 8.7–10.3)
Chloride: 105 mmol/L (ref 96–106)
Creatinine, Ser: 0.78 mg/dL (ref 0.57–1.00)
Globulin, Total: 2.4 g/dL (ref 1.5–4.5)
Glucose: 99 mg/dL (ref 70–99)
Potassium: 4.4 mmol/L (ref 3.5–5.2)
Sodium: 142 mmol/L (ref 134–144)
Total Protein: 6.8 g/dL (ref 6.0–8.5)
eGFR: 76 mL/min/{1.73_m2} (ref >59)

## 2022-04-27 LAB — CBC
Hematocrit: 33 % — ABNORMAL LOW (ref 34.0–46.6)
Hemoglobin: 10.5 g/dL — ABNORMAL LOW (ref 11.1–15.9)
MCH: 28.5 pg (ref 26.6–33.0)
MCHC: 31.8 g/dL (ref 31.5–35.7)
MCV: 89 fL (ref 79–97)
Platelets: 219 10*3/uL (ref 150–450)
RBC: 3.69 x10E6/uL — ABNORMAL LOW (ref 3.77–5.28)
RDW: 13.8 % (ref 11.7–15.4)
WBC: 5.8 10*3/uL (ref 3.4–10.8)

## 2022-04-27 LAB — LIPID PANEL
Chol/HDL Ratio: 2.7 ratio (ref 0.0–4.4)
Cholesterol, Total: 153 mg/dL (ref 100–199)
HDL: 56 mg/dL (ref >39)
LDL Chol Calc (NIH): 83 mg/dL (ref 0–99)
Triglycerides: 70 mg/dL (ref 0–149)
VLDL Cholesterol Cal: 14 mg/dL (ref 5–40)

## 2022-05-01 DIAGNOSIS — R011 Cardiac murmur, unspecified: Secondary | ICD-10-CM | POA: Insufficient documentation

## 2022-05-01 NOTE — Progress Notes (Signed)
Annual Wellness Visit     Patient: Lynn Mccoy, Female    DOB: December 04, 1940, 81 y.o.   MRN: [id number] Visit Date: 04/26/2022  Today's Provider: [name], MD    Lynn Mccoy is a 81 y.o. female who presents today for her Annual Wellness Visit.   Medications: Outpatient Medications Prior to Visit  Medication Sig   amLODipine (NORVASC) 2.5 MG tablet Take 1 tablet (2.5 mg total) by mouth daily.   celecoxib (CELEBREX) 100 MG capsule Take 100 mg by mouth 2 (two) times daily.   cetirizine (ZYRTEC) 10 MG tablet Take 10 mg by mouth daily.   cholecalciferol (VITAMIN D) 1000 UNITS tablet Take 1,000 Units by mouth daily.   diclofenac Sodium (VOLTAREN) 1 % GEL Apply 1 application. topically at bedtime as needed (knee pain).   montelukast (SINGULAIR) 10 MG tablet Take 1 tablet (10 mg total) by mouth at bedtime.   NON FORMULARY Take 1 tablet by mouth daily. Neuriva OTC   NONFORMULARY OR COMPOUNDED ITEM Take 4 tablets by mouth daily. SeroVital- hgh   sertraline (ZOLOFT) 100 MG tablet TAKE ONE TABLET EVERY DAY (Patient taking differently: Take 100 mg by mouth daily as needed (depression).)   tiZANidine (ZANAFLEX) 4 MG tablet Take 1 tablet (4 mg total) by mouth at bedtime. For stiff neck   traMADol (ULTRAM) 50 MG tablet Take 1 tablet (50 mg total) by mouth every 4 (four) hours as needed for moderate pain.   traZODone (DESYREL) 100 MG tablet TAKE ONE-HALF TO ONE TABLET ONE HOUR BEFORE BEDTIME. (Patient taking differently: Take 50-100 mg by mouth at bedtime.)   enoxaparin (LOVENOX) 40 MG/0.4ML injection Inject 0.4 mLs (40 mg total) into the skin daily for 14 days.   [DISCONTINUED] celecoxib (CELEBREX) 200 MG capsule Take 1 capsule (200 mg total) by mouth 2 (two) times daily.   [DISCONTINUED] oxyCODONE (OXY IR/ROXICODONE) 5 MG immediate release tablet Take 1 tablet (5 mg total) by mouth every 4 (four) hours as needed for severe pain.   No facility-administered medications  prior to visit.    No Active Allergies  Patient Care Team: Birdie Sons, MD as PCP - General (Family Medicine) Emmaline Kluver., MD (Rheumatology) Leandrew Koyanagi, MD as Referring Physician (Ophthalmology)        Objective      Most recent functional status assessment:    04/26/2022    9:22 AM  In your present state of health, do you have any difficulty performing the following activities:  Hearing? 0  Vision? 0  Difficulty concentrating or making decisions? 0  Walking or climbing stairs? 1  Dressing or bathing? 0  Doing errands, shopping? 0   Most recent fall risk assessment:    04/26/2022    9:22 AM  Fall Risk   Falls in the past year? 0  Number falls in past yr: 0  Injury with Fall? 0  Risk for fall due to : No Fall Risks  Follow up Falls prevention discussed    Most recent depression screenings:    04/26/2022    9:22 AM 08/29/2021    8:34 AM  PHQ 2/9 Scores  PHQ - 2 Score 1 1  PHQ- 9 Score 4 4   Most recent cognitive screening:    04/25/2021    9:00 AM  6CIT Screen  What Year? 0 points  What month? 0 points  What time? 0 points  Count back from 20 0 points  Months in reverse 0 points  Repeat phrase 2 points  Total Score 2 points   Most recent Audit-C alcohol use screening    04/25/2021    9:13 AM  Alcohol Use Disorder Test (AUDIT)  1. How often do you have a drink containing alcohol? 1  2. How many drinks containing alcohol do you have on a typical day when you are drinking? 0  3. How often do you have six or more drinks on one occasion? 0  AUDIT-C Score 1   A score of 3 or more in women, and 4 or more in men indicates increased risk for alcohol abuse, EXCEPT if all of the points are from question 1     Assessment & Plan     Annual wellness visit done today including the all of the following: Reviewed patient's Family Medical History Reviewed and updated list of patient's medical providers Assessment of cognitive  impairment was done Assessed patient's functional ability Established a written schedule for health screening services Health Risk Assessent Completed and Reviewed  Exercise Activities and Dietary recommendations  Goals      Exercise 3x per week (30 min per time)     Recommend to walk 3x per week (30 min per time).      Increase water intake     Recommend drinking 6-8 glasses of water per day.         Immunization History  Administered Date(s) Administered   Fluad Quad(high Dose 65+) 04/21/2019, 04/21/2020, 04/25/2021, 04/26/2022   Influenza, High Dose Seasonal PF 04/11/2017, 04/08/2018   PFIZER(Purple Top)SARS-COV-2 Vaccination 08/06/2019, 08/27/2019, 04/12/2020, 02/14/2021   Pfizer Covid-19 Vaccine Bivalent Booster 61yr & up 05/18/2021   Pneumococcal Conjugate-13 03/12/2014   Pneumococcal Polysaccharide-23 03/22/2005   Td 01/19/1997, 11/23/2008   Tdap 12/11/2015   Zoster Recombinat (Shingrix) 05/29/2018, 08/16/2018   Zoster, Live 10/28/2007    Health Maintenance  Topic Date Due   COVID-19 Vaccine (6 - Pfizer series) 09/15/2021   Medicare Annual Wellness (AWV)  04/25/2022   DEXA SCAN  06/01/2023   TETANUS/TDAP  12/10/2025   Pneumonia Vaccine 81 Years old  Completed   INFLUENZA VACCINE  Completed   Zoster Vaccines- Shingrix  Completed   HPV VACCINES  Aged Out     Discussed health benefits of physical activity, and encouraged her to engage in regular exercise appropriate for her age and condition.      The entirety of the information documented in the History of Present Illness, Review of Systems and Physical Exam were personally obtained by me. Portions of this information were initially documented by the CMA and reviewed by me for thoroughness and accuracy.     DLelon Huh MD  [phone](phone) [phone](fax)  [place]

## 2022-05-03 ENCOUNTER — Other Ambulatory Visit: Payer: Self-pay | Admitting: Family Medicine

## 2022-05-03 ENCOUNTER — Telehealth: Payer: Self-pay

## 2022-05-03 DIAGNOSIS — D539 Nutritional anemia, unspecified: Secondary | ICD-10-CM

## 2022-05-03 DIAGNOSIS — Z1231 Encounter for screening mammogram for malignant neoplasm of breast: Secondary | ICD-10-CM

## 2022-05-03 NOTE — Telephone Encounter (Signed)
Patient asking for lab results from last week.

## 2022-05-04 NOTE — Telephone Encounter (Signed)
Labs show that is is a little bit anemic, everything else was normal. Need to check B12, iron TIBC and ferritin. [facility] requires a new blood drawn. Have placed order, she can stop  by lab during normal hours, does not need to be fasting.

## 2022-05-04 NOTE — Telephone Encounter (Signed)
Patient advised. She agrees to stop by the lab to have blood work done by next week. Lab order placed up front at [address].

## 2022-05-04 NOTE — Addendum Note (Signed)
Addended by: Birdie Sons on: 05/04/2022 02:26 PM   Modules accepted: Orders

## 2022-05-04 NOTE — Telephone Encounter (Signed)
Called [facility] to add labs on, they discarded the lab yesterday. Patient is calling asking for note on labs that were done. Please advise.

## 2022-05-05 ENCOUNTER — Ambulatory Visit
Admission: RE | Admit: 2022-05-05 | Discharge: 2022-05-05 | Disposition: A | Discharge status: Home / Self Care | Payer: Medicare Other | Source: Ambulatory Visit | Attending: Family Medicine | Admitting: Family Medicine

## 2022-05-05 DIAGNOSIS — I10 Essential (primary) hypertension: Secondary | ICD-10-CM | POA: Insufficient documentation

## 2022-05-05 DIAGNOSIS — I351 Nonrheumatic aortic (valve) insufficiency: Secondary | ICD-10-CM | POA: Insufficient documentation

## 2022-05-05 DIAGNOSIS — E785 Hyperlipidemia, unspecified: Secondary | ICD-10-CM | POA: Insufficient documentation

## 2022-05-05 DIAGNOSIS — R011 Cardiac murmur, unspecified: Secondary | ICD-10-CM

## 2022-05-05 DIAGNOSIS — D539 Nutritional anemia, unspecified: Secondary | ICD-10-CM | POA: Diagnosis not present

## 2022-05-05 LAB — ECHOCARDIOGRAM COMPLETE
AR max vel: 1.63 cm2
AV Area VTI: 1.7 cm2
AV Area mean vel: 1.61 cm2
AV Mean grad: 9 mmHg
AV Peak grad: 16.4 mmHg
Ao pk vel: 2.03 m/s
Area-P 1/2: 3.34 cm2
P 1/2 time: 660 msec
S' Lateral: 3.1 cm

## 2022-05-05 NOTE — Progress Notes (Signed)
*  PRELIMINARY RESULTS* Echocardiogram 2D Echocardiogram has been performed.  Sherrie Sport 05/05/2022, 9:43 AM

## 2022-05-06 ENCOUNTER — Encounter: Payer: Self-pay | Admitting: Family Medicine

## 2022-05-06 DIAGNOSIS — D509 Iron deficiency anemia, unspecified: Secondary | ICD-10-CM | POA: Insufficient documentation

## 2022-05-06 LAB — RETICULOCYTES: Retic Ct Pct: 1 % (ref 0.6–2.6)

## 2022-05-06 LAB — IRON,TIBC AND FERRITIN PANEL
Ferritin: 22 ng/mL (ref 15–150)
Iron Saturation: 17 % (ref 15–55)
Iron: 61 ug/dL (ref 27–139)
Total Iron Binding Capacity: 349 ug/dL (ref 250–450)
UIBC: 288 ug/dL (ref 118–369)

## 2022-05-06 LAB — VITAMIN B12: Vitamin B-12: 1060 pg/mL (ref 232–1245)

## 2022-05-15 ENCOUNTER — Other Ambulatory Visit: Payer: Self-pay | Admitting: Family Medicine

## 2022-05-15 DIAGNOSIS — G4700 Insomnia, unspecified: Secondary | ICD-10-CM

## 2022-06-08 ENCOUNTER — Ambulatory Visit
Admission: RE | Admit: 2022-06-08 | Discharge: 2022-06-08 | Disposition: A | Discharge status: Home / Self Care | Payer: Medicare Other | Source: Ambulatory Visit | Attending: Family Medicine | Admitting: Family Medicine

## 2022-06-08 DIAGNOSIS — Z1231 Encounter for screening mammogram for malignant neoplasm of breast: Secondary | ICD-10-CM | POA: Diagnosis not present

## 2022-07-04 ENCOUNTER — Other Ambulatory Visit: Payer: Self-pay | Admitting: Family Medicine

## 2022-07-04 DIAGNOSIS — M436 Torticollis: Secondary | ICD-10-CM

## 2022-07-04 DIAGNOSIS — I10 Essential (primary) hypertension: Secondary | ICD-10-CM

## 2022-07-05 NOTE — Addendum Note (Signed)
Addended by: Erie Noe on: 07/05/2022 09:43 AM   Modules accepted: Orders

## 2022-07-05 NOTE — Telephone Encounter (Signed)
Requested Prescriptions  Pending Prescriptions Disp Refills   amLODipine (NORVASC) 2.5 MG tablet [Pharmacy Med Name: AMLODIPINE BESYLATE 2.5 MG TAB] 90 tablet 1    Sig: Take 1 tablet (2.5 mg total) by mouth daily.     Cardiovascular: Calcium Channel Blockers 2 Failed - 07/04/2022  9:12 AM      Failed - Last BP in normal range    BP Readings from Last 1 Encounters:  04/26/22 (!) 148/62         Passed - Last Heart Rate in normal range    Pulse Readings from Last 1 Encounters:  04/26/22 (!) 52         Passed - Valid encounter within last 6 months    Recent Outpatient Visits           2 months ago Annual physical exam   [facility] Birdie Sons, MD   10 months ago Essential hypertension   [facility] Birdie Sons, MD   1 year ago Annual physical exam   [facility] Birdie Sons, MD   2 years ago Annual physical exam   [facility] Birdie Sons, MD   2 years ago [place], Vickki Muff, PA-C       Future Appointments             In 4 months Fisher, Kirstie Peri, MD [facility]             tiZANidine (ZANAFLEX) 4 MG tablet [Pharmacy Med Name: TIZANIDINE HCL 4 MG TABLET] 30 tablet 0    Sig: Take 1 tablet (4 mg total) by mouth at bedtime. For stiff neck     Not Delegated - Cardiovascular:  Alpha-2 Agonists - tizanidine Failed - 07/04/2022  9:12 AM      Failed - This refill cannot be delegated      Passed - Valid encounter within last 6 months    Recent Outpatient Visits           2 months ago Annual physical exam   [facility] Birdie Sons, MD   10 months ago Essential hypertension   [facility] Birdie Sons, MD   1 year ago Annual physical exam   [facility] Birdie Sons, MD   2 years ago Annual physical exam   [facility] Birdie Sons, MD   2 years ago  [place], Vickki Muff, PA-C       Future Appointments             In 4 months Fisher, Kirstie Peri, MD [facility], [place]

## 2022-07-05 NOTE — Telephone Encounter (Signed)
[employer] called, spoke to [place], [place]. Advised him that tizanidine was accidentally sent and should have been sent to Dr. Caryn Section d/t non delegated drug. Advised him to disregard once received and will send request to Dr. Caryn Section to send in refill. He states once received will just dc until gets new one.

## 2022-09-19 ENCOUNTER — Other Ambulatory Visit: Payer: Self-pay | Admitting: Family Medicine

## 2022-09-19 DIAGNOSIS — F4321 Adjustment disorder with depressed mood: Secondary | ICD-10-CM

## 2022-09-20 NOTE — Telephone Encounter (Signed)
Requested Prescriptions  Pending Prescriptions Disp Refills   sertraline (ZOLOFT) 100 MG tablet [Pharmacy Med Name: SERTRALINE HCL 100 MG TABLET] 90 tablet 0    Sig: Take 1 tablet (100 mg total) by mouth daily as needed (depression).     Psychiatry:  Antidepressants - SSRI - sertraline Passed - 09/19/2022 10:13 AM      Passed - AST in normal range and within 360 days    AST  Date Value Ref Range Status  04/26/2022 21 0 - 40 IU/L Final         Passed - ALT in normal range and within 360 days    ALT  Date Value Ref Range Status  04/26/2022 16 0 - 32 IU/L Final         Passed - Completed PHQ-2 or PHQ-9 in the last 360 days      Passed - Valid encounter within last 6 months    Recent Outpatient Visits           4 months ago Annual physical exam   [facility] Birdie Sons, MD   1 year ago Essential hypertension   [place], Donald E, MD   1 year ago Annual physical exam   [facility] Birdie Sons, MD   2 years ago Annual physical exam   [facility] Birdie Sons, MD   2 years ago [place], PA-C       Future Appointments             In 1 month Fisher, Kirstie Peri, MD [facility], [place]

## 2022-11-06 ENCOUNTER — Encounter: Payer: Self-pay | Admitting: Family Medicine

## 2022-11-06 ENCOUNTER — Ambulatory Visit (INDEPENDENT_AMBULATORY_CARE_PROVIDER_SITE_OTHER): Payer: Medicare Other | Admitting: Family Medicine

## 2022-11-06 VITALS — BP 136/62 | HR 61 | Wt 161.4 lb

## 2022-11-06 DIAGNOSIS — I10 Essential (primary) hypertension: Secondary | ICD-10-CM

## 2022-11-06 DIAGNOSIS — D509 Iron deficiency anemia, unspecified: Secondary | ICD-10-CM | POA: Diagnosis not present

## 2022-11-06 NOTE — Patient Instructions (Signed)
Please review the attached list of medications and notify my office if there are any errors.   Start taking an over the counter iron supplement, ferrous sulfate 325mg  once every other day

## 2022-11-06 NOTE — Progress Notes (Signed)
I,Sha'taria Tyson,acting as a Neurosurgeon for Mila Merry, MD.,have documented all relevant documentation on the behalf of Mila Merry, MD,as directed by  Mila Merry, MD while in the presence of Mila Merry, MD.   Established patient visit   Patient: Lynn Mccoy   DOB: [birth year]   82 y.o. Female  MRN: [id number] Visit Date: 11/06/2022  Today's healthcare provider: Mila Merry, MD    Subjective    HPI  Hypertension, follow-up  BP Readings from Last 3 Encounters:  04/26/22 (!) 148/62  11/15/21 (!) 118/45  08/29/21 (!) 151/57   Wt Readings from Last 3 Encounters:  04/26/22 164 lb 8 oz (74.6 kg)  11/14/21 162 lb (73.5 kg)  08/29/21 168 lb (76.2 kg)     She was last seen for hypertension 6 months ago.  BP at that visit was 148/62. Management since that visit includes continue current treatment.  Outside blood pressures are 145/73 this morning sometimes as low as 129. Symptoms: No chest pain No chest pressure  No palpitations No syncope  No dyspnea No orthopnea  No paroxysmal nocturnal dyspnea Yes lower extremity edema   Pertinent labs Lab Results  Component Value Date   CHOL 153 04/26/2022   HDL 56 04/26/2022   LDLCALC 83 04/26/2022   TRIG 70 04/26/2022   CHOLHDL 2.7 04/26/2022   Lab Results  Component Value Date   NA 142 04/26/2022   K 4.4 04/26/2022   CREATININE 0.78 04/26/2022   EGFR 76 04/26/2022   GLUCOSE 99 04/26/2022   TSH 1.080 04/25/2021     The ASCVD Risk score (Arnett DK, et al., 2019) failed to calculate for the following reasons:   The 2019 ASCVD risk score is only valid for ages 93 to 23  ---------------------------------------------------------------------------------------------------  She was also noted to be mildly anemic and iron deficient at her last visit  Lab Results  Component Value Date   IRON 61 05/05/2022   TIBC 349 05/05/2022   FERRITIN 22 05/05/2022   Lab Results  Component Value Date   WBC 5.8 04/26/2022   HGB  10.5 (L) 04/26/2022   HCT 33.0 (L) 04/26/2022   MCV 89 04/26/2022   PLT 219 04/26/2022  She was advised to start taking an over the counter iron supplement but she has not yet done so.   Medications: Outpatient Medications Prior to Visit  Medication Sig   amLODipine (NORVASC) 2.5 MG tablet Take 1 tablet (2.5 mg total) by mouth daily.   celecoxib (CELEBREX) 100 MG capsule Take 100 mg by mouth 2 (two) times daily.   cetirizine (ZYRTEC) 10 MG tablet Take 10 mg by mouth daily.   cholecalciferol (VITAMIN D) 1000 UNITS tablet Take 1,000 Units by mouth daily.   diclofenac Sodium (VOLTAREN) 1 % GEL Apply 1 application. topically at bedtime as needed (knee pain).   montelukast (SINGULAIR) 10 MG tablet Take 1 tablet (10 mg total) by mouth at bedtime.   NON FORMULARY Take 1 tablet by mouth daily. Neuriva OTC   NONFORMULARY OR COMPOUNDED ITEM Take 4 tablets by mouth daily. SeroVital- hgh   sertraline (ZOLOFT) 100 MG tablet Take 1 tablet (100 mg total) by mouth daily as needed (depression).   tiZANidine (ZANAFLEX) 4 MG tablet Take 1 tablet (4 mg total) by mouth at bedtime. For stiff neck   traMADol (ULTRAM) 50 MG tablet Take 1 tablet (50 mg total) by mouth every 4 (four) hours as needed for moderate pain.   traZODone (DESYREL) 100 MG tablet  Take 0.5-1 tablets (50-100 mg total) by mouth at bedtime.   No facility-administered medications prior to visit.    Review of Systems  Constitutional:  Negative for appetite change, chills, fatigue and fever.  Respiratory:  Negative for chest tightness and shortness of breath.   Cardiovascular:  Negative for chest pain and palpitations.  Gastrointestinal:  Negative for abdominal pain, nausea and vomiting.  Neurological:  Negative for dizziness and weakness.       Objective    BP 136/62   Pulse 61   Wt 161 lb 6.4 oz (73.2 kg)   SpO2 98%   BMI 25.27 kg/m    Physical Exam   General: Appearance:    Well developed, well nourished female in no acute  distress  Eyes:    PERRL, conjunctiva/corneas clear, EOM's intact       Lungs:     Clear to auscultation bilaterally, respirations unlabored  Heart:    Normal heart rate. Normal rhythm.  2/6 systolic murmur 2/6 high pitched, blowing, decrescendo, diastolic murmur at the left third intercostal space and lower sternal border   MS:   All extremities are intact.    Neurologic:   Awake, alert, oriented x 3. No apparent focal neurological defect.         Assessment & Plan     1. Primary hypertension Bp near goal. She does report some swelling in legs when standing for prolonged periods of time. Continue current medications.    2. Iron deficiency anemia, unspecified iron deficiency anemia type She has not yet started OTC iron supplement and recommended that she do so.   Future Appointments  Date Time Provider Department Center  05/09/2023  8:20 AM Malva Limes, MD [facility]           The entirety of the information documented in the History of Present Illness, Review of Systems and Physical Exam were personally obtained by me. Portions of this information were initially documented by the CMA and reviewed by me for thoroughness and accuracy.     Mila Merry, MD  [facility] [phone] (phone) [phone] (fax)  [facility]

## 2022-11-16 DIAGNOSIS — Z96652 Presence of left artificial knee joint: Secondary | ICD-10-CM | POA: Diagnosis not present

## 2023-01-01 ENCOUNTER — Other Ambulatory Visit: Payer: Self-pay | Admitting: Family Medicine

## 2023-01-01 DIAGNOSIS — I10 Essential (primary) hypertension: Secondary | ICD-10-CM

## 2023-02-05 ENCOUNTER — Other Ambulatory Visit: Payer: Self-pay | Admitting: Family Medicine

## 2023-02-05 DIAGNOSIS — F4321 Adjustment disorder with depressed mood: Secondary | ICD-10-CM

## 2023-03-07 ENCOUNTER — Ambulatory Visit (INDEPENDENT_AMBULATORY_CARE_PROVIDER_SITE_OTHER): Payer: Medicare Other

## 2023-03-07 VITALS — Ht 67.0 in | Wt 161.0 lb

## 2023-03-07 DIAGNOSIS — Z0000 Encounter for general adult medical examination without abnormal findings: Secondary | ICD-10-CM

## 2023-03-07 NOTE — Progress Notes (Signed)
Subjective:   Lynn Mccoy is a 82 y.o. female who presents for Medicare Annual (Subsequent) preventive examination.  Visit Complete: Virtual  I connected with  [name] on 03/07/23 by a audio enabled telemedicine application and verified that I am speaking with the correct person using two identifiers.  Patient Location: Home  Provider Location: Office/Clinic  I discussed the limitations of evaluation and management by telemedicine. The patient expressed understanding and agreed to proceed.  Vital Signs: Unable to obtain new vitals due to this being a telehealth visit.  Patient Medicare AWV questionnaire was completed by the patient on 03/06/23; I have confirmed that all information answered by patient is correct and no changes since this date.  Review of Systems    Cardiac Risk Factors include: advanced age (>65men, >73 women);hypertension;sedentary lifestyle    Objective:    Today's Vitals   03/07/23 0844  Weight: 161 lb (73 kg)  Height: 5\' 7"  (1.702 m)   Body mass index is 25.22 kg/m.     03/07/2023    8:50 AM 11/14/2021    9:49 AM 11/03/2021    1:31 PM 04/21/2020    2:31 PM 04/10/2019    8:46 AM 04/08/2018    8:45 AM 03/06/2018   10:00 AM  Advanced Directives  Does Patient Have a Medical Advance Directive? Yes Yes Yes Yes Yes Yes Yes  Type of Estate agent of [place];Living will Healthcare Power of [place];Living will  Healthcare Power of [place];Living will Healthcare Power of [place];Living will Healthcare Power of [place];Living will Healthcare Power of [place];Living will  Does patient want to make changes to medical advance directive?  No - Patient declined     No - Patient declined  Copy of Healthcare Power of Attorney in Chart?  No - copy requested  No - copy requested No - copy requested No - copy requested No - copy requested    Current Medications (verified) Outpatient Encounter Medications as of 03/07/2023  Medication Sig    amLODipine (NORVASC) 2.5 MG tablet Take 1 tablet (2.5 mg total) by mouth daily.   cetirizine (ZYRTEC) 10 MG tablet Take 10 mg by mouth daily.   cholecalciferol (VITAMIN D) 1000 UNITS tablet Take 1,000 Units by mouth daily.   diclofenac Sodium (VOLTAREN) 1 % GEL Apply 1 application. topically at bedtime as needed (knee pain).   montelukast (SINGULAIR) 10 MG tablet Take 1 tablet (10 mg total) by mouth at bedtime.   NON FORMULARY Take 1 tablet by mouth daily. Neuriva OTC   NONFORMULARY OR COMPOUNDED ITEM Take 4 tablets by mouth daily. SeroVital- hgh   sertraline (ZOLOFT) 100 MG tablet TAKE ONE TABLET EVERY DAY   traZODone (DESYREL) 100 MG tablet Take 0.5-1 tablets (50-100 mg total) by mouth at bedtime.   celecoxib (CELEBREX) 100 MG capsule Take 100 mg by mouth 2 (two) times daily. (Patient not taking: Reported on 11/06/2022)   tiZANidine (ZANAFLEX) 4 MG tablet Take 1 tablet (4 mg total) by mouth at bedtime. For stiff neck (Patient not taking: Reported on 11/06/2022)   traMADol (ULTRAM) 50 MG tablet Take 1 tablet (50 mg total) by mouth every 4 (four) hours as needed for moderate pain. (Patient not taking: Reported on 11/06/2022)   No facility-administered encounter medications on file as of 03/07/2023.    Allergies (verified) Patient has no known allergies.   History: Past Medical History:  Diagnosis Date   Anemia    Cancer (HCC)    ovarian ca  Depression    History of cervical cancer 12/29/2014   stage 4, s/p complete hysterectomy    History of mumps as a child    Hyperlipidemia    Hypertension    Osteoarthrosis    knees   Osteopenia    Wears dentures    full upper and lower   Past Surgical History:  Procedure Laterality Date   ABDOMINAL HYSTERECTOMY  1977   due to cervical cancer stage 4   APPENDECTOMY  1977   arthritis Bilateral    CATARACT EXTRACTION W/PHACO Left 03/06/2018   Procedure: CATARACT EXTRACTION PHACO AND INTRAOCULAR LENS PLACEMENT (IOC) COMPLICATED LEFT;  Surgeon:  Lockie Mola, MD;  Location: [facility];  Service: Ophthalmology;  Laterality: Left;  HEALON 5 VISION BLUE CAPSULAR TENSION RING MALYUGIN   COLONOSCOPY     KNEE ARTHROPLASTY Left 11/14/2021   Procedure: COMPUTER ASSISTED TOTAL KNEE ARTHROPLASTY;  Surgeon: Donato Heinz, MD;  Location: [facility];  Service: Orthopedics;  Laterality: Left;   OOPHORECTOMY     REPLACEMENT TOTAL KNEE Right    ROTATOR CUFF REPAIR Right about 2006   Dr. Hyacinth Meeker   Family History  Problem Relation Age of Onset   Leukemia Mother    Lung cancer Father    Dementia Sister    Alzheimer's disease Sister    Heart attack Brother    Rheum arthritis Sister    COPD Sister    Breast cancer Neg Hx    Social History   Socioeconomic History   Marital status: Widowed    Spouse name: Not on file   Number of children: 3   Years of education: 12   Highest education level: High school graduate  Occupational History   Occupation: Retired    Comment: former Scientist, research (medical)  Tobacco Use   Smoking status: Never   Smokeless tobacco: Never  Vaping Use   Vaping status: Never Used  Substance and Sexual Activity   Alcohol use: Yes    Alcohol/week: 0.0 - 1.0 standard drinks of alcohol   Drug use: No   Sexual activity: Not Currently  Other Topics Concern   Not on file  Social History Narrative   Lives alone   Social Determinants of Health   Financial Resource Strain: Low Risk  (03/06/2023)   Overall Financial Resource Strain (CARDIA)    Difficulty of Paying Living Expenses: Not hard at all  Food Insecurity: No Food Insecurity (03/06/2023)   Hunger Vital Sign    Worried About Running Out of Food in the Last Year: Never true    Ran Out of Food in the Last Year: Never true  Transportation Needs: No Transportation Needs (03/06/2023)   PRAPARE - Administrator, Civil Service (Medical): No    Lack of Transportation (Non-Medical): No  Physical Activity: Insufficiently Active (03/06/2023)   Exercise Vital  Sign    Days of Exercise per Week: 2 days    Minutes of Exercise per Session: 10 min  Stress: No Stress Concern Present (03/06/2023)   [employer] of Occupational Health - Occupational Stress Questionnaire    Feeling of Stress : Not at all  Social Connections: Unknown (03/06/2023)   Social Connection and Isolation Panel [NHANES]    Frequency of Communication with Friends and Family: More than three times a week    Frequency of Social Gatherings with Friends and Family: More than three times a week    Attends Religious Services: Not on [employer] of Clubs or  Organizations: No    Attends Banker Meetings: Never    Marital Status: Widowed    Tobacco Counseling Counseling given: Not Answered   Clinical Intake:  Pre-visit preparation completed: Yes  Pain : No/denies pain   BMI - recorded: 25.22 Nutritional Status: BMI 25 -29 Overweight Nutritional Risks: None Diabetes: No  How often do you need to have someone help you when you read instructions, pamphlets, or other written materials from your doctor or pharmacy?: 1 - Never  Interpreter Needed?: No  Comments: lives alone Information entered by :: B.Avaiah Stempel,LPN   Activities of Daily Living    03/06/2023    2:29 PM 11/06/2022    8:20 AM  In your present state of health, do you have any difficulty performing the following activities:  Hearing? 0 0  Vision? 0 0  Difficulty concentrating or making decisions? 0 0  Walking or climbing stairs? 0 0  Dressing or bathing? 0 0  Doing errands, shopping? 0 0  Preparing Food and eating ? N   Using the Toilet? N   In the past six months, have you accidently leaked urine? N   Do you have problems with loss of bowel control? N   Managing your Medications? N   Managing your Finances? N   Housekeeping or managing your Housekeeping? N     Patient Care Team: Malva Limes, MD as PCP - General (Family Medicine) Kandyce Rud., MD  (Rheumatology) Lockie Mola, MD as Referring Physician (Ophthalmology)  Indicate any recent Medical Services you may have received from other than [facility] providers in the past year (date may be approximate).     Assessment:   This is a routine wellness examination for [place].  Hearing/Vision screen Hearing Screening - Comments:: Adequate hearing Vision Screening - Comments:: Adequate vision after cataract surgery in lft eye [facility]  Dietary issues and exercise activities discussed:     Goals Addressed             This Visit's Progress    Exercise 3x per week (30 min per time)   Not on track    Recommend to walk 3x per week (30 min per time).     Increase water intake   Not on track    Recommend drinking 6-8 glasses of water per day.        Depression Screen    03/07/2023    8:49 AM 11/06/2022    8:20 AM 04/26/2022    9:22 AM 08/29/2021    8:34 AM 04/25/2021    9:12 AM 04/21/2020    8:35 AM 04/10/2019    8:46 AM  PHQ 2/9 Scores  PHQ - 2 Score 1 0 1 1 1 3  0  PHQ- 9 Score  1 4 4 4 8      Fall Risk    03/06/2023    2:29 PM 11/06/2022    8:20 AM 04/26/2022    9:22 AM 04/25/2021    9:12 AM 04/21/2020    2:31 PM  Fall Risk   Falls in the past year? 0 0 0 0 0  Number falls in past yr: 0 0 0 0 0  Injury with Fall? 0 0 0 0 0  Risk for fall due to : No Fall Risks No Fall Risks No Fall Risks No Fall Risks   Follow up Education provided;Falls prevention discussed Falls evaluation completed Falls prevention discussed Falls evaluation completed     MEDICARE RISK AT HOME: Medicare Risk  at Home Any stairs in or around the home?: No If so, are there any without handrails?: No Home free of loose throw rugs in walkways, pet beds, electrical cords, etc?: Yes Adequate lighting in your home to reduce risk of falls?: Yes Life alert?: Yes Use of a cane, walker or w/c?: No Grab bars in the bathroom?: Yes Shower chair or bench in shower?: No Elevated toilet seat or a  handicapped toilet?: Yes  TIMED UP AND GO:  Was the test performed?  No    Cognitive Function:        03/07/2023    8:53 AM 04/25/2021    9:00 AM 04/04/2017    9:21 AM  6CIT Screen  What Year? 0 points 0 points 0 points  What month? 0 points 0 points 0 points  What time? 0 points 0 points 0 points  Count back from 20 0 points 0 points 0 points  Months in reverse 0 points 0 points 0 points  Repeat phrase 0 points 2 points 8 points  Total Score 0 points 2 points 8 points    Immunizations Immunization History  Administered Date(s) Administered   Fluad Quad(high Dose 65+) 04/21/2019, 04/21/2020, 04/25/2021, 04/26/2022   Influenza, High Dose Seasonal PF 04/11/2017, 04/08/2018   PFIZER(Purple Top)SARS-COV-2 Vaccination 08/06/2019, 08/27/2019, 04/12/2020, 02/14/2021   Pfizer Covid-19 Vaccine Bivalent Booster 40yrs & up 05/18/2021   Pneumococcal Conjugate-13 03/12/2014   Pneumococcal Polysaccharide-23 03/22/2005   Respiratory Syncytial Virus Vaccine,Recomb Aduvanted(Arexvy) 07/03/2022   Td 01/19/1997, 11/23/2008   Tdap 12/11/2015   Zoster Recombinant(Shingrix) 05/29/2018, 08/16/2018   Zoster, Live 10/28/2007    TDAP status: Up to date  Flu Vaccine status: Up to date  Pneumococcal vaccine status: Up to date  Covid-19 vaccine status: Completed vaccines  Qualifies for Shingles Vaccine? Yes   Zostavax completed Yes   Shingrix Completed?: Yes  Screening Tests Health Maintenance  Topic Date Due   INFLUENZA VACCINE  02/01/2023   COVID-19 Vaccine (6 - 2023-24 season) 03/04/2023   DEXA SCAN  06/01/2023   Medicare Annual Wellness (AWV)  03/06/2024   DTaP/Tdap/Td (4 - Td or Tdap) 12/10/2025   Pneumonia Vaccine 78+ Years old  Completed   Zoster Vaccines- Shingrix  Completed   HPV VACCINES  Aged Out    Health Maintenance  Health Maintenance Due  Topic Date Due   INFLUENZA VACCINE  02/01/2023   COVID-19 Vaccine (6 - 2023-24 season) 03/04/2023    Colorectal cancer  screening: No longer required.   Mammogram status: No longer required due to age.  Bone Density status: Completed yes. Results reflect: Bone density results: OSTEOPENIA. Repeat every 3-5 years.  Lung Cancer Screening: (Low Dose CT Chest recommended if Age 26-80 years, 20 pack-year currently smoking OR have quit w/in 15years.) does not qualify.   Lung Cancer Screening Referral: no  Additional Screening:  Hepatitis C Screening: does not qualify; Completed yes  Vision Screening: Recommended annual ophthalmology exams for early detection of glaucoma and other disorders of the eye. Is the patient up to date with their annual eye exam?  Yes  Who is the provider or what is the name of the office in which the patient attends annual eye exams? Dr Druscilla Brownie If pt is not established with a provider, would they like to be referred to a provider to establish care? No .   Dental Screening: Recommended annual dental exams for proper oral hygiene  Diabetic Foot Exam: n/a  Community Resource Referral / Chronic Care Management: CRR required this  visit?  No   CCM required this visit?  No    Plan:     I have personally reviewed and noted the following in the patient's chart:   Medical and social history Use of alcohol, tobacco or illicit drugs  Current medications and supplements including opioid prescriptions. Patient is not currently taking opioid prescriptions. Functional ability and status Nutritional status Physical activity Advanced directives List of other physicians Hospitalizations, surgeries, and ER visits in previous 12 months Vitals Screenings to include cognitive, depression, and falls Referrals and appointments  In addition, I have reviewed and discussed with patient certain preventive protocols, quality metrics, and best practice recommendations. A written personalized care plan for preventive services as well as general preventive health recommendations were provided to  patient.    Sue Lush, LPN   10/07/9627   After Visit Summary: (MyChart) Due to this being a telephonic visit, the after visit summary with patients personalized plan was offered to patient via MyChart   Nurse Notes: The patient states she is doing well and has no concerns or questions at this time.

## 2023-03-07 NOTE — Patient Instructions (Signed)
Lynn Mccoy , Thank you for taking time to come for your Medicare Wellness Visit. I appreciate your ongoing commitment to your health goals. Please review the following plan we discussed and let me know if I can assist you in the future.   Referrals/Orders/Follow-Ups/Clinician Recommendations: none  This is a list of the screening recommended for you and due dates:  Health Maintenance  Topic Date Due   Flu Shot  02/01/2023   COVID-19 Vaccine (6 - 2023-24 season) 03/04/2023   DEXA scan (bone density measurement)  06/01/2023   Medicare Annual Wellness Visit  03/06/2024   DTaP/Tdap/Td vaccine (4 - Td or Tdap) 12/10/2025   Pneumonia Vaccine  Completed   Zoster (Shingles) Vaccine  Completed   HPV Vaccine  Aged Out    Advanced directives: (Copy Requested) Please bring a copy of your health care power of attorney and living will to the office to be added to your chart at your convenience.  Next Medicare Annual Wellness Visit scheduled for next year: Yes 03/11/24 @ 8:45am telephone

## 2023-03-17 NOTE — Progress Notes (Signed)
I have reviewed the health advisor's note, was available for consultation, and agree with documentation and plan  Mila Merry, MD

## 2023-03-20 DIAGNOSIS — M79602 Pain in left arm: Secondary | ICD-10-CM | POA: Diagnosis not present

## 2023-03-20 DIAGNOSIS — M67912 Unspecified disorder of synovium and tendon, left shoulder: Secondary | ICD-10-CM | POA: Diagnosis not present

## 2023-03-20 DIAGNOSIS — G8929 Other chronic pain: Secondary | ICD-10-CM | POA: Diagnosis not present

## 2023-04-04 ENCOUNTER — Other Ambulatory Visit: Payer: Self-pay | Admitting: Family Medicine

## 2023-04-04 DIAGNOSIS — I10 Essential (primary) hypertension: Secondary | ICD-10-CM

## 2023-04-04 NOTE — Telephone Encounter (Signed)
Requested Prescriptions  Pending Prescriptions Disp Refills   amLODipine (NORVASC) 2.5 MG tablet [Pharmacy Med Name: AMLODIPINE BESYLATE 2.5 MG TAB] 90 tablet 0    Sig: Take 1 tablet (2.5 mg total) by mouth daily.     Cardiovascular: Calcium Channel Blockers 2 Passed - 04/04/2023  8:58 AM      Passed - Last BP in normal range    BP Readings from Last 1 Encounters:  11/06/22 136/62         Passed - Last Heart Rate in normal range    Pulse Readings from Last 1 Encounters:  11/06/22 61         Passed - Valid encounter within last 6 months    Recent Outpatient Visits           4 months ago Primary hypertension   [place] [facility] Malva Limes, MD   11 months ago Annual physical exam   [facility] Malva Limes, MD   1 year ago Essential hypertension   [place] [facility] Malva Limes, MD   1 year ago Annual physical exam   [facility] Malva Limes, MD   2 years ago Annual physical exam   [facility] Malva Limes, MD       Future Appointments             In 1 month Fisher, Demetrios Isaacs, MD [facility]

## 2023-04-12 ENCOUNTER — Other Ambulatory Visit: Payer: Self-pay | Admitting: Family Medicine

## 2023-04-12 DIAGNOSIS — G4700 Insomnia, unspecified: Secondary | ICD-10-CM

## 2023-04-12 NOTE — Telephone Encounter (Signed)
Requested Prescriptions  Pending Prescriptions Disp Refills   traZODone (DESYREL) 100 MG tablet [Pharmacy Med Name: TRAZODONE 100 MG TABLET] 30 tablet 0    Sig: TAKE ONE-HALF TO ONE TABLET ONE HOUR BEFORE BEDTIME.     Psychiatry: Antidepressants - Serotonin Modulator Passed - 04/12/2023  9:37 AM      Passed - Completed PHQ-2 or PHQ-9 in the last 360 days      Passed - Valid encounter within last 6 months    Recent Outpatient Visits           5 months ago Primary hypertension   [place] [facility] Malva Limes, MD   11 months ago Annual physical exam   [facility] Malva Limes, MD   1 year ago Essential hypertension   [facility] Malva Limes, MD   1 year ago Annual physical exam   [facility] Malva Limes, MD   2 years ago Annual physical exam   [facility] Malva Limes, MD       Future Appointments             In 3 weeks Fisher, Demetrios Isaacs, MD [facility]

## 2023-04-20 DIAGNOSIS — M79602 Pain in left arm: Secondary | ICD-10-CM | POA: Diagnosis not present

## 2023-04-20 DIAGNOSIS — G8929 Other chronic pain: Secondary | ICD-10-CM | POA: Diagnosis not present

## 2023-04-20 DIAGNOSIS — M67912 Unspecified disorder of synovium and tendon, left shoulder: Secondary | ICD-10-CM | POA: Diagnosis not present

## 2023-05-09 ENCOUNTER — Other Ambulatory Visit: Payer: Self-pay | Admitting: Family Medicine

## 2023-05-09 ENCOUNTER — Ambulatory Visit (INDEPENDENT_AMBULATORY_CARE_PROVIDER_SITE_OTHER): Payer: Medicare Other | Admitting: Family Medicine

## 2023-05-09 VITALS — BP 152/59 | HR 68 | Temp 97.7°F | Ht 67.0 in | Wt 167.0 lb

## 2023-05-09 DIAGNOSIS — M8580 Other specified disorders of bone density and structure, unspecified site: Secondary | ICD-10-CM

## 2023-05-09 DIAGNOSIS — Z0000 Encounter for general adult medical examination without abnormal findings: Secondary | ICD-10-CM

## 2023-05-09 DIAGNOSIS — D509 Iron deficiency anemia, unspecified: Secondary | ICD-10-CM | POA: Diagnosis not present

## 2023-05-09 DIAGNOSIS — Z23 Encounter for immunization: Secondary | ICD-10-CM | POA: Diagnosis not present

## 2023-05-09 DIAGNOSIS — I10 Essential (primary) hypertension: Secondary | ICD-10-CM | POA: Diagnosis not present

## 2023-05-09 DIAGNOSIS — E2839 Other primary ovarian failure: Secondary | ICD-10-CM

## 2023-05-09 DIAGNOSIS — Z1231 Encounter for screening mammogram for malignant neoplasm of breast: Secondary | ICD-10-CM

## 2023-05-09 NOTE — Progress Notes (Signed)
Complete physical exam   Patient: Lynn Mccoy   DOB: [birth year]   82 y.o. Female  MRN: [id number] Visit Date: 05/09/2023  Today's healthcare provider: Mila Merry, MD   Chief Complaint  Patient presents with   Annual Exam    Patient was seen by nurse health advisor in September.  She is feeling well and has no questions or concerns.  She is compliant and toleratine her medications.   Subjective    Discussed the use of AI scribe software for clinical note transcription with the patient, who gave verbal consent to proceed.  History of Present Illness   The patient, with a history of hypertension, presented for a routine physical examination.   The patient has been monitoring her blood pressure at home with a wrist monitor noting that it was consistently around 134/73, which she believed to be an improvement. She is currently on a regimen of diuretics for blood pressure management and has not reported any adverse effects from the medication.  The patient also reported taking an iron supplement every other day for iron deficient anemia without any gastrointestinal upset. She denied any new medications or changes to her current regimen.  The patient denied any recent changes in her health status, including any new or worsening symptoms. She did not report any abdominal pain, cramping, or other gastrointestinal issues. She also denied any changes in hearing or any experience of tinnitus.      HPI     Annual Exam    Additional comments: Patient was seen by nurse health advisor in September.  She is feeling well and has no questions or concerns.  She is compliant and toleratine her medications.      Last edited by Adline Peals, CMA on 05/09/2023  8:13 AM.       Past Medical History:  Diagnosis Date   Anemia    Cancer (HCC)    ovarian ca   Depression    History of cervical cancer 12/29/2014   stage 4, s/p complete hysterectomy    History of mumps as a child     Hyperlipidemia    Hypertension    Osteoarthrosis    knees   Osteopenia    Wears dentures    full upper and lower   Past Surgical History:  Procedure Laterality Date   ABDOMINAL HYSTERECTOMY  1977   due to cervical cancer stage 4   APPENDECTOMY  1977   arthritis Bilateral    CATARACT EXTRACTION W/PHACO Left 03/06/2018   Procedure: CATARACT EXTRACTION PHACO AND INTRAOCULAR LENS PLACEMENT (IOC) COMPLICATED LEFT;  Surgeon: Lockie Mola, MD;  Location: [facility];  Service: Ophthalmology;  Laterality: Left;  HEALON 5 VISION BLUE CAPSULAR TENSION RING MALYUGIN   COLONOSCOPY     KNEE ARTHROPLASTY Left 11/14/2021   Procedure: COMPUTER ASSISTED TOTAL KNEE ARTHROPLASTY;  Surgeon: Donato Heinz, MD;  Location: [facility];  Service: Orthopedics;  Laterality: Left;   OOPHORECTOMY     REPLACEMENT TOTAL KNEE Right    ROTATOR CUFF REPAIR Right about 2006   Dr. Hyacinth Meeker   Social History   Socioeconomic History   Marital status: Widowed    Spouse name: Not on file   Number of children: 3   Years of education: 12   Highest education level: High school graduate  Occupational History   Occupation: Retired    Comment: former Scientist, research (medical)  Tobacco Use   Smoking status: Never   Smokeless tobacco: Never  Advertising account planner  Vaping status: Never Used  Substance and Sexual Activity   Alcohol use: Yes    Alcohol/week: 0.0 - 1.0 standard drinks of alcohol   Drug use: No   Sexual activity: Not Currently  Other Topics Concern   Not on file  Social History Narrative   Lives alone   Social Determinants of Health   Financial Resource Strain: Low Risk  (03/06/2023)   Overall Financial Resource Strain (CARDIA)    Difficulty of Paying Living Expenses: Not hard at all  Food Insecurity: No Food Insecurity (03/06/2023)   Hunger Vital Sign    Worried About Running Out of Food in the Last Year: Never true    Ran Out of Food in the Last Year: Never true  Transportation Needs: No Transportation  Needs (03/06/2023)   PRAPARE - Administrator, Civil Service (Medical): No    Lack of Transportation (Non-Medical): No  Physical Activity: Insufficiently Active (03/06/2023)   Exercise Vital Sign    Days of Exercise per Week: 2 days    Minutes of Exercise per Session: 10 min  Stress: No Stress Concern Present (03/06/2023)   [employer] of Occupational Health - Occupational Stress Questionnaire    Feeling of Stress : Not at all  Social Connections: Unknown (03/06/2023)   Social Connection and Isolation Panel [NHANES]    Frequency of Communication with Friends and Family: More than three times a week    Frequency of Social Gatherings with Friends and Family: More than three times a week    Attends Religious Services: Not on file    Active Member of Clubs or Organizations: No    Attends Banker Meetings: Never    Marital Status: Widowed  Intimate Partner Violence: Not At Risk (03/07/2023)   Humiliation, Afraid, Rape, and Kick questionnaire    Fear of Current or Ex-Partner: No    Emotionally Abused: No    Physically Abused: No    Sexually Abused: No   Family Status  Relation Name Status   Mother  Deceased       Cause of Death: Leukemia   Father  Deceased       Cause of Death: Lung Cancer   Sister  Deceased   Brother  Deceased       Cause of death: MI   Sister Karen Kays Deceased   Neg Hx  (Not Specified)  No partnership data on file   Family History  Problem Relation Age of Onset   Leukemia Mother    Lung cancer Father    Dementia Sister    Alzheimer's disease Sister    Heart attack Brother    Rheum arthritis Sister    COPD Sister    Breast cancer Neg Hx    No Known Allergies  Patient Care Team: Malva Limes, MD as PCP - General (Family Medicine) Kandyce Rud., MD (Rheumatology) Lockie Mola, MD as Referring Physician (Ophthalmology)   Medications: Outpatient Medications Prior to Visit  Medication Sig   amLODipine  (NORVASC) 2.5 MG tablet Take 1 tablet (2.5 mg total) by mouth daily.   cetirizine (ZYRTEC) 10 MG tablet Take 10 mg by mouth daily.   cholecalciferol (VITAMIN D) 1000 UNITS tablet Take 1,000 Units by mouth daily.   diclofenac Sodium (VOLTAREN) 1 % GEL Apply 1 application. topically at bedtime as needed (knee pain).   ferrous sulfate 325 (65 FE) MG EC tablet Take 325 mg by mouth every other day.   montelukast (SINGULAIR) 10  MG tablet Take 1 tablet (10 mg total) by mouth at bedtime.   NON FORMULARY Take 1 tablet by mouth daily. Neuriva OTC   NONFORMULARY OR COMPOUNDED ITEM Take 4 tablets by mouth daily. SeroVital- hgh   sertraline (ZOLOFT) 100 MG tablet TAKE ONE TABLET EVERY DAY   traZODone (DESYREL) 100 MG tablet TAKE ONE-HALF TO ONE TABLET ONE HOUR BEFORE BEDTIME.   celecoxib (CELEBREX) 100 MG capsule Take 100 mg by mouth 2 (two) times daily. (Patient not taking: Reported on 11/06/2022)   tiZANidine (ZANAFLEX) 4 MG tablet Take 1 tablet (4 mg total) by mouth at bedtime. For stiff neck (Patient not taking: Reported on 11/06/2022)   traMADol (ULTRAM) 50 MG tablet Take 1 tablet (50 mg total) by mouth every 4 (four) hours as needed for moderate pain. (Patient not taking: Reported on 11/06/2022)   No facility-administered medications prior to visit.    Review of Systems  Constitutional:  Negative for chills, fatigue and fever.  HENT:  Negative for congestion, ear pain, rhinorrhea, sneezing and sore throat.   Eyes: Negative.  Negative for pain and redness.  Respiratory:  Negative for cough, shortness of breath and wheezing.   Cardiovascular:  Negative for chest pain and leg swelling.  Gastrointestinal:  Negative for abdominal pain, blood in stool, constipation, diarrhea and nausea.  Endocrine: Negative for polydipsia and polyphagia.  Genitourinary: Negative.  Negative for dysuria, flank pain, hematuria, pelvic pain, vaginal bleeding and vaginal discharge.  Musculoskeletal:  Negative for arthralgias, back  pain, gait problem and joint swelling.  Skin:  Negative for rash.  Neurological: Negative.  Negative for dizziness, tremors, seizures, weakness, light-headedness, numbness and headaches.  Hematological:  Negative for adenopathy.  Psychiatric/Behavioral: Negative.  Negative for behavioral problems, confusion and dysphoric mood. The patient is not nervous/anxious and is not hyperactive.       Objective    BP (!) 152/59 (BP Location: Left Arm, Patient Position: Sitting, Cuff Size: Normal)   Pulse 68   Temp 97.7 F (36.5 C) (Oral)   Ht 5\' 7"  (1.702 m)   Wt 167 lb (75.8 kg)   SpO2 96%   BMI 26.16 kg/m     Physical Exam   VITALS: BP- 134/73 HEENT: Ears without abnormalities, nasal mucosa without abnormalities, oropharynx without abnormalities CHEST: Breath sounds clear CARDIOVASCULAR: Heart sounds normal ABDOMEN: Soft, non-tender     Last depression screening scores    03/07/2023    8:49 AM 11/06/2022    8:20 AM 04/26/2022    9:22 AM  PHQ 2/9 Scores  PHQ - 2 Score 1 0 1  PHQ- 9 Score  1 4   Last fall risk screening    03/06/2023    2:29 PM  Fall Risk   Falls in the past year? 0  Number falls in past yr: 0  Injury with Fall? 0  Risk for fall due to : No Fall Risks  Follow up Education provided;Falls prevention discussed   Last Audit-C alcohol use screening    03/06/2023    2:29 PM  Alcohol Use Disorder Test (AUDIT)  1. How often do you have a drink containing alcohol? 0  3. How often do you have six or more drinks on one occasion? 0   A score of 3 or more in women, and 4 or more in men indicates increased risk for alcohol abuse, EXCEPT if all of the points are from question 1   No results found for any visits on 05/09/23.  Assessment & Plan  Routine Health Maintenance and Physical Exam  Exercise Activities and Dietary recommendations  Goals      Exercise 3x per week (30 min per time)     Recommend to walk 3x per week (30 min per time).      Increase water  intake     Recommend drinking 6-8 glasses of water per day.         Immunization History  Administered Date(s) Administered   Fluad Quad(high Dose 65+) 04/21/2019, 04/21/2020, 04/25/2021, 04/26/2022   Fluad Trivalent(High Dose 65+) 05/09/2023   Influenza, High Dose Seasonal PF 04/11/2017, 04/08/2018   PFIZER(Purple Top)SARS-COV-2 Vaccination 08/06/2019, 08/27/2019, 04/12/2020, 02/14/2021   Pfizer Covid-19 Vaccine Bivalent Booster 84yrs & up 05/18/2021   Pneumococcal Conjugate-13 03/12/2014   Pneumococcal Polysaccharide-23 03/22/2005   Respiratory Syncytial Virus Vaccine,Recomb Aduvanted(Arexvy) 07/03/2022   Td 01/19/1997, 11/23/2008   Tdap 12/11/2015   Zoster Recombinant(Shingrix) 05/29/2018, 08/16/2018   Zoster, Live 10/28/2007    Health Maintenance  Topic Date Due   COVID-19 Vaccine (6 - 2023-24 season) 03/04/2023   DEXA SCAN  06/01/2023   Medicare Annual Wellness (AWV)  03/06/2024   DTaP/Tdap/Td (4 - Td or Tdap) 12/10/2025   Pneumonia Vaccine 24+ Years old  Completed   INFLUENZA VACCINE  Completed   Zoster Vaccines- Shingrix  Completed   HPV VACCINES  Aged Out    Discussed health benefits of physical activity, and encouraged her to engage in regular exercise appropriate for her age and condition.     -Administer influenza vaccine today. -Order routine blood work. -Order bone density test. -Patient to call to schedule mammogram after physical examination.   Hypertension Elevated blood pressure noted during visit. Patient reports lower readings at home with wrist cuff. Discussed potential inaccuracies with wrist cuff measurements. -Recommend obtaining a brachial blood pressure cuff for more accurate home readings. -Continue current antihypertensive regimen. -Check blood pressure daily at home and record readings.  Iron deficiency anemia Patient taking iron supplement every other day without gastrointestinal side effects. -Continue current regimen.          Mila Merry, MD  [facility] [phone] (phone) [phone] (fax)  [facility]

## 2023-05-10 LAB — IRON,TIBC AND FERRITIN PANEL
Ferritin: 52 ng/mL (ref 15–150)
Iron Saturation: 18 % (ref 15–55)
Iron: 55 ug/dL (ref 27–139)
Total Iron Binding Capacity: 299 ug/dL (ref 250–450)
UIBC: 244 ug/dL (ref 118–369)

## 2023-05-10 LAB — CBC
Hematocrit: 35.9 % (ref 34.0–46.6)
Hemoglobin: 11.6 g/dL (ref 11.1–15.9)
MCH: 30.4 pg (ref 26.6–33.0)
MCHC: 32.3 g/dL (ref 31.5–35.7)
MCV: 94 fL (ref 79–97)
Platelets: 201 10*3/uL (ref 150–450)
RBC: 3.82 x10E6/uL (ref 3.77–5.28)
RDW: 13.1 % (ref 11.7–15.4)
WBC: 5.1 10*3/uL (ref 3.4–10.8)

## 2023-05-10 LAB — COMPREHENSIVE METABOLIC PANEL
ALT: 17 [IU]/L (ref 0–32)
AST: 25 [IU]/L (ref 0–40)
Albumin: 4.4 g/dL (ref 3.7–4.7)
Alkaline Phosphatase: 66 [IU]/L (ref 44–121)
BUN/Creatinine Ratio: 17 (ref 12–28)
BUN: 15 mg/dL (ref 8–27)
Bilirubin Total: 0.2 mg/dL (ref 0.0–1.2)
CO2: 24 mmol/L (ref 20–29)
Calcium: 9.4 mg/dL (ref 8.7–10.3)
Chloride: 105 mmol/L (ref 96–106)
Creatinine, Ser: 0.89 mg/dL (ref 0.57–1.00)
Globulin, Total: 2.2 g/dL (ref 1.5–4.5)
Glucose: 101 mg/dL — ABNORMAL HIGH (ref 70–99)
Potassium: 3.7 mmol/L (ref 3.5–5.2)
Sodium: 142 mmol/L (ref 134–144)
Total Protein: 6.6 g/dL (ref 6.0–8.5)
eGFR: 64 mL/min/{1.73_m2} (ref >59)

## 2023-05-10 LAB — LIPID PANEL
Chol/HDL Ratio: 2.7 ratio (ref 0.0–4.4)
Cholesterol, Total: 168 mg/dL (ref 100–199)
HDL: 63 mg/dL (ref >39)
LDL Chol Calc (NIH): 93 mg/dL (ref 0–99)
Triglycerides: 60 mg/dL (ref 0–149)
VLDL Cholesterol Cal: 12 mg/dL (ref 5–40)

## 2023-06-29 ENCOUNTER — Other Ambulatory Visit: Payer: Self-pay | Admitting: Family Medicine

## 2023-06-29 DIAGNOSIS — G4700 Insomnia, unspecified: Secondary | ICD-10-CM

## 2023-06-29 DIAGNOSIS — I10 Essential (primary) hypertension: Secondary | ICD-10-CM

## 2023-06-29 DIAGNOSIS — F4321 Adjustment disorder with depressed mood: Secondary | ICD-10-CM

## 2023-07-31 ENCOUNTER — Ambulatory Visit
Admission: RE | Admit: 2023-07-31 | Discharge: 2023-07-31 | Disposition: A | Discharge status: Home / Self Care | Payer: Medicare Other | Source: Ambulatory Visit | Attending: Family Medicine | Admitting: Family Medicine

## 2023-07-31 ENCOUNTER — Encounter: Payer: Self-pay | Admitting: Family Medicine

## 2023-07-31 ENCOUNTER — Other Ambulatory Visit: Payer: Self-pay | Admitting: Family Medicine

## 2023-07-31 DIAGNOSIS — Z1231 Encounter for screening mammogram for malignant neoplasm of breast: Secondary | ICD-10-CM | POA: Diagnosis not present

## 2023-07-31 DIAGNOSIS — M816 Localized osteoporosis [Lequesne]: Secondary | ICD-10-CM

## 2023-07-31 DIAGNOSIS — E2839 Other primary ovarian failure: Secondary | ICD-10-CM | POA: Insufficient documentation

## 2023-07-31 DIAGNOSIS — M8589 Other specified disorders of bone density and structure, multiple sites: Secondary | ICD-10-CM | POA: Diagnosis not present

## 2023-07-31 MED ORDER — ALENDRONATE SODIUM 70 MG PO TABS: 70.0000 mg | ORAL_TABLET | ORAL | 11 refills | Status: DC

## 2023-11-15 DIAGNOSIS — Z96652 Presence of left artificial knee joint: Secondary | ICD-10-CM | POA: Diagnosis not present

## 2023-12-19 ENCOUNTER — Ambulatory Visit

## 2024-02-14 ENCOUNTER — Encounter: Payer: Self-pay | Admitting: Family Medicine

## 2024-03-31 ENCOUNTER — Other Ambulatory Visit: Payer: Self-pay | Admitting: Family Medicine

## 2024-03-31 DIAGNOSIS — I10 Essential (primary) hypertension: Secondary | ICD-10-CM

## 2024-04-21 ENCOUNTER — Emergency Department

## 2024-04-21 ENCOUNTER — Observation Stay
Admission: EM | Admit: 2024-04-21 | Discharge: 2024-04-22 | Disposition: A | Discharge status: Home / Self Care | Source: Ambulatory Visit | Attending: Student | Admitting: Student

## 2024-04-21 ENCOUNTER — Other Ambulatory Visit: Payer: Self-pay

## 2024-04-21 DIAGNOSIS — I5033 Acute on chronic diastolic (congestive) heart failure: Secondary | ICD-10-CM | POA: Diagnosis not present

## 2024-04-21 DIAGNOSIS — I110 Hypertensive heart disease with heart failure: Secondary | ICD-10-CM | POA: Diagnosis not present

## 2024-04-21 DIAGNOSIS — D509 Iron deficiency anemia, unspecified: Secondary | ICD-10-CM | POA: Diagnosis not present

## 2024-04-21 DIAGNOSIS — R918 Other nonspecific abnormal finding of lung field: Secondary | ICD-10-CM | POA: Diagnosis not present

## 2024-04-21 DIAGNOSIS — R0789 Other chest pain: Secondary | ICD-10-CM | POA: Diagnosis present

## 2024-04-21 DIAGNOSIS — Z6825 Body mass index (BMI) 25.0-25.9, adult: Secondary | ICD-10-CM | POA: Diagnosis not present

## 2024-04-21 DIAGNOSIS — R262 Difficulty in walking, not elsewhere classified: Secondary | ICD-10-CM | POA: Diagnosis not present

## 2024-04-21 DIAGNOSIS — I2510 Atherosclerotic heart disease of native coronary artery without angina pectoris: Secondary | ICD-10-CM | POA: Diagnosis not present

## 2024-04-21 DIAGNOSIS — E663 Overweight: Secondary | ICD-10-CM | POA: Insufficient documentation

## 2024-04-21 DIAGNOSIS — I4891 Unspecified atrial fibrillation: Secondary | ICD-10-CM | POA: Diagnosis not present

## 2024-04-21 DIAGNOSIS — R079 Chest pain, unspecified: Secondary | ICD-10-CM | POA: Diagnosis not present

## 2024-04-21 DIAGNOSIS — I10 Essential (primary) hypertension: Secondary | ICD-10-CM | POA: Diagnosis present

## 2024-04-21 DIAGNOSIS — Z8541 Personal history of malignant neoplasm of cervix uteri: Secondary | ICD-10-CM | POA: Insufficient documentation

## 2024-04-21 LAB — BASIC METABOLIC PANEL WITH GFR
Anion gap: 16 — ABNORMAL HIGH (ref 5–15)
BUN: 13 mg/dL (ref 8–23)
CO2: 23 mmol/L (ref 22–32)
Calcium: 8.7 mg/dL — ABNORMAL LOW (ref 8.9–10.3)
Chloride: 103 mmol/L (ref 98–111)
Creatinine, Ser: 0.7 mg/dL (ref 0.44–1.00)
GFR, Estimated: >60 mL/min (ref >60)
Glucose, Bld: 103 mg/dL — ABNORMAL HIGH (ref 70–99)
Potassium: 3.6 mmol/L (ref 3.5–5.1)
Sodium: 142 mmol/L (ref 135–145)

## 2024-04-21 LAB — CBC
HCT: 32.4 % — ABNORMAL LOW (ref 36.0–46.0)
Hemoglobin: 10.8 g/dL — ABNORMAL LOW (ref 12.0–15.0)
MCH: 30.9 pg (ref 26.0–34.0)
MCHC: 33.3 g/dL (ref 30.0–36.0)
MCV: 92.6 fL (ref 80.0–100.0)
Platelets: 214 K/uL (ref 150–400)
RBC: 3.5 MIL/uL — ABNORMAL LOW (ref 3.87–5.11)
RDW: 13.4 % (ref 11.5–15.5)
WBC: 8.2 K/uL (ref 4.0–10.5)
nRBC: 0 % (ref 0.0–0.2)

## 2024-04-21 LAB — PROTIME-INR
INR: 1.1 (ref 0.8–1.2)
Prothrombin Time: 14.5 s (ref 11.4–15.2)

## 2024-04-21 LAB — TROPONIN I (HIGH SENSITIVITY)
Troponin I (High Sensitivity): 5 ng/L (ref <18)
Troponin I (High Sensitivity): 5 ng/L (ref <18)

## 2024-04-21 LAB — MAGNESIUM: Magnesium: 1.9 mg/dL (ref 1.7–2.4)

## 2024-04-21 LAB — BRAIN NATRIURETIC PEPTIDE: B Natriuretic Peptide: 362.7 pg/mL — ABNORMAL HIGH (ref 0.0–100.0)

## 2024-04-21 LAB — TSH: TSH: 1.402 u[IU]/mL (ref 0.350–4.500)

## 2024-04-21 LAB — APTT: aPTT: 30 s (ref 24–36)

## 2024-04-21 MED ORDER — TRAZODONE HCL 50 MG PO TABS: 50.0000 mg | ORAL_TABLET | Freq: Every evening | ORAL | Status: DC | PRN

## 2024-04-21 MED ORDER — FERROUS SULFATE 325 (65 FE) MG PO TABS: 325.0000 mg | ORAL_TABLET | ORAL | Status: DC

## 2024-04-21 MED ORDER — ONDANSETRON HCL 4 MG/2ML IJ SOLN: 4.0000 mg | Freq: Three times a day (TID) | INTRAMUSCULAR | Status: DC | PRN

## 2024-04-21 MED ORDER — IOHEXOL 350 MG/ML SOLN
75.0000 mL | Freq: Once | INTRAVENOUS | Status: AC | PRN
  Administered 2024-04-21: 75 mL via INTRAVENOUS

## 2024-04-21 MED ORDER — SODIUM CHLORIDE 0.9 % IV BOLUS
1000.0000 mL | Freq: Once | INTRAVENOUS | Status: AC
  Administered 2024-04-21: 1000 mL via INTRAVENOUS

## 2024-04-21 MED ORDER — SERTRALINE HCL 50 MG PO TABS
100.0000 mg | ORAL_TABLET | Freq: Every day | ORAL | Status: DC
  Administered 2024-04-22: 100 mg via ORAL
  Filled 2024-04-21: qty 2

## 2024-04-21 MED ORDER — DILTIAZEM HCL-DEXTROSE 125-5 MG/125ML-% IV SOLN (PREMIX)
5.0000 mg/h | INTRAVENOUS | Status: DC
  Filled 2024-04-21: qty 125

## 2024-04-21 MED ORDER — LORATADINE 10 MG PO TABS
10.0000 mg | ORAL_TABLET | Freq: Every day | ORAL | Status: DC
  Administered 2024-04-22: 10 mg via ORAL
  Filled 2024-04-21: qty 1

## 2024-04-21 MED ORDER — METOPROLOL TARTRATE 5 MG/5ML IV SOLN
5.0000 mg | Freq: Once | INTRAVENOUS | Status: AC
  Administered 2024-04-21: 5 mg via INTRAVENOUS
  Filled 2024-04-21: qty 5

## 2024-04-21 MED ORDER — DM-GUAIFENESIN ER 30-600 MG PO TB12: 1.0000 | ORAL_TABLET | Freq: Two times a day (BID) | ORAL | Status: DC | PRN

## 2024-04-21 MED ORDER — HYDRALAZINE HCL 20 MG/ML IJ SOLN: 5.0000 mg | INTRAMUSCULAR | Status: DC | PRN

## 2024-04-21 MED ORDER — METOPROLOL TARTRATE 25 MG PO TABS
25.0000 mg | ORAL_TABLET | Freq: Two times a day (BID) | ORAL | Status: DC
  Administered 2024-04-21: 25 mg via ORAL
  Filled 2024-04-21: qty 1

## 2024-04-21 MED ORDER — HEPARIN (PORCINE) 25000 UT/250ML-% IV SOLN
1350.0000 [IU]/h | INTRAVENOUS | Status: DC
Start: 2024-04-21 — End: 2024-04-22
  Administered 2024-04-21: 1150 [IU]/h via INTRAVENOUS
  Filled 2024-04-21: qty 250

## 2024-04-21 MED ORDER — FUROSEMIDE 10 MG/ML IJ SOLN
40.0000 mg | Freq: Two times a day (BID) | INTRAMUSCULAR | Status: DC
  Administered 2024-04-21 – 2024-04-22 (×2): 40 mg via INTRAVENOUS
  Filled 2024-04-21 (×2): qty 4

## 2024-04-21 MED ORDER — ACETAMINOPHEN 325 MG PO TABS: 650.0000 mg | ORAL_TABLET | Freq: Four times a day (QID) | ORAL | Status: DC | PRN

## 2024-04-21 MED ORDER — HEPARIN BOLUS VIA INFUSION
4000.0000 [IU] | Freq: Once | INTRAVENOUS | Status: AC
  Administered 2024-04-21: 4000 [IU] via INTRAVENOUS
  Filled 2024-04-21: qty 4000

## 2024-04-21 MED ORDER — VITAMIN D 25 MCG (1000 UNIT) PO TABS
1000.0000 [IU] | ORAL_TABLET | Freq: Every day | ORAL | Status: DC
  Administered 2024-04-22: 1000 [IU] via ORAL
  Filled 2024-04-21: qty 1

## 2024-04-21 MED ORDER — METOPROLOL TARTRATE 25 MG/10 ML ORAL SUSPENSION
25.0000 mg | Freq: Two times a day (BID) | ORAL | Status: DC
  Filled 2024-04-21: qty 10

## 2024-04-21 MED ORDER — CELECOXIB 100 MG PO CAPS
100.0000 mg | ORAL_CAPSULE | Freq: Two times a day (BID) | ORAL | Status: DC
  Administered 2024-04-21 – 2024-04-22 (×2): 100 mg via ORAL
  Filled 2024-04-21 (×2): qty 1

## 2024-04-21 MED ORDER — ALBUTEROL SULFATE (2.5 MG/3ML) 0.083% IN NEBU: 2.5000 mg | INHALATION_SOLUTION | RESPIRATORY_TRACT | Status: DC | PRN

## 2024-04-21 MED ORDER — OXYCODONE-ACETAMINOPHEN 5-325 MG PO TABS
1.0000 | ORAL_TABLET | ORAL | Status: DC | PRN
Start: 2024-04-21 — End: 2024-04-22

## 2024-04-21 MED ORDER — AMLODIPINE BESYLATE 5 MG PO TABS: 2.5000 mg | ORAL_TABLET | Freq: Every day | ORAL | Status: DC

## 2024-04-21 NOTE — ED Provider Notes (Addendum)
 [facility] Provider Note    Event Date/Time   First MD Initiated Contact with Patient 04/21/24 1518     (approximate)   History   Tachycardia and Chest Pain   HPI  Lynn Mccoy is a 83 y.o. female presents to the emergency department with chest pain and tachycardia.  Patient states that she started to not feel well last night.  States that she started having some chest pain and felt like it was painful to take a deep breath.  States this has never happened in the past.  Continued to feel poorly today and called her daughter and told her she needed to go to the hospital.  [name] to urgent care today and was told that she was in atrial fibrillation with a rapid rate and that she needed to go to the emergency department.  States that she was in her normal state of health until yesterday.  Denies fever, chills, nausea, vomiting or diarrhea.  No dysuria, urinary urgency or frequency.  Denies any significant cough.  Does endorse some ongoing chest discomfort that is worse with deep breathing.  No history of DVT or PE.  No recent surgery or travel.  No unexplained weight loss.  No tobacco or alcohol use.  Denies any abdominal pain.  Does not follow with cardiology.  No new medications.   Physical Exam   Triage Vital Signs: ED Triage Vitals [04/21/24 1449]  Encounter Vitals Group     BP      Girls Systolic BP Percentile      Girls Diastolic BP Percentile      Boys Systolic BP Percentile      Boys Diastolic BP Percentile      Pulse      Resp      Temp      Temp src      SpO2      Weight 168 lb (76.2 kg)     Height 5' 7 (1.702 m)     Head Circumference      Peak Flow      Pain Score 5     Pain Loc      Pain Education      Exclude from Growth Chart     Most recent vital signs: Vitals:   04/21/24 1630 04/21/24 1740  BP: 126/73 114/80  Resp: 20 (!) 21  Temp:    SpO2: 98% (!) 89%    Physical Exam Constitutional:      Appearance: She is  well-developed.  HENT:     Head: Atraumatic.  Eyes:     Conjunctiva/sclera: Conjunctivae normal.  Cardiovascular:     Rate and Rhythm: Tachycardia present. Rhythm irregular.     Pulses:          Radial pulses are 2+ on the right side and 2+ on the left side.       Dorsalis pedis pulses are 2+ on the right side and 2+ on the left side.  Pulmonary:     Effort: No respiratory distress.  Abdominal:     General: There is no distension.     Palpations: Abdomen is soft.     Tenderness: There is no abdominal tenderness.  Musculoskeletal:        General: Normal range of motion.     Cervical back: Normal range of motion.     Right lower leg: No edema.     Left lower leg: No edema.  Skin:    General:  Skin is warm.     Capillary Refill: Capillary refill takes less than 2 seconds.  Neurological:     General: No focal deficit present.     Mental Status: She is alert. Mental status is at baseline.     IMPRESSION / MDM / ASSESSMENT AND PLAN / ED COURSE  I reviewed the triage vital signs and the nursing notes.  On arrival patient tachycardic, tachypneic, blood pressure 130/79.  Differential diagnosis including new onset atrial fibrillation with a rapid rate, hyperthyroidism, pneumonia, pulmonary embolism, ACS, dehydration  EKG  I, Clotilda Punter, the attending physician, personally viewed and interpreted this ECG.  Atrial fibrillation with a rapid rate of 116.  Nonspecific ST changes.  No significant ST elevation or depression.  No findings of acute ischemia  Atrial fibrillation with a rapid rate while on cardiac telemetry.  RADIOLOGY I independently reviewed imaging, my interpretation of imaging: Chest x-ray with no obvious findings of pneumonia or pulmonary edema.  Read as blunting of costophrenic angles concerning for possible trace effusions.  LABS (all labs ordered are listed, but only abnormal results are displayed) Labs interpreted as -    Labs Reviewed  BASIC METABOLIC  PANEL WITH GFR - Abnormal; Notable for the following components:      Result Value   Glucose, Bld 103 (*)    Calcium 8.7 (*)    Anion gap 16 (*)    All other components within normal limits  CBC - Abnormal; Notable for the following components:   RBC 3.50 (*)    Hemoglobin 10.8 (*)    HCT 32.4 (*)    All other components within normal limits  BRAIN NATRIURETIC PEPTIDE - Abnormal; Notable for the following components:   B Natriuretic Peptide 362.7 (*)    All other components within normal limits  TSH  TROPONIN I (HIGH SENSITIVITY)  TROPONIN I (HIGH SENSITIVITY)     MDM  Patient presented to the emergency department with new onset atrial fibrillation with a rapid rate.  Given a 500 bolus of IV fluids with some improvement of her heart rate however we will hold on any further IV fluids given findings concerning for some signs of heart failure on her chest x-ray and does have an elevated BNP.  Moderate risk Wells criteria, CTA with no signs of a pulmonary embolism.  Did note trace bilateral pleural effusions.  Cardiomegaly.  Thyroid  studies within normal limits no significant electrolyte abnormalities.  Started on heparin for new onset atrial fibrillation and will start on metoprolol.  Consulted hospitalist for admission for new onset atrial fibrillation with rapid rate.     PROCEDURES:  Critical Care performed: yes  .Critical Care  Performed by: Punter Clotilda, MD Authorized by: Punter Clotilda, MD   Critical care provider statement:    Critical care time (minutes):  30   Critical care time was exclusive of:  Separately billable procedures and treating other patients   Critical care was necessary to treat or prevent imminent or life-threatening deterioration of the following conditions:  Cardiac failure   Critical care was time spent personally by me on the following activities:  Development of treatment plan with patient or surrogate, discussions with consultants, evaluation  of patient's response to treatment, examination of patient, ordering and review of laboratory studies, ordering and review of radiographic studies, ordering and performing treatments and interventions, pulse oximetry, re-evaluation of patient's condition and review of old charts   Care discussed with: admitting provider     Patient's presentation  is most consistent with acute presentation with potential threat to life or bodily function.   MEDICATIONS ORDERED IN ED: Medications  metoprolol tartrate (LOPRESSOR) injection 5 mg (has no administration in time range)  sodium chloride  0.9 % bolus 1,000 mL (0 mLs Intravenous Stopped 04/21/24 1737)  iohexol (OMNIPAQUE) 350 MG/ML injection 75 mL (75 mLs Intravenous Contrast Given 04/21/24 1704)    FINAL CLINICAL IMPRESSION(S) / ED DIAGNOSES   Final diagnoses:  Atrial fibrillation with rapid ventricular response (HCC)     Rx / DC Orders   ED Discharge Orders     None        Note:  This document was prepared using Dragon voice recognition software and may include unintentional dictation errors.   Suzanne Kirsch, MD 04/21/24 1746    Suzanne Kirsch, MD 04/21/24 8240

## 2024-04-21 NOTE — Consult Note (Signed)
 PHARMACY - ANTICOAGULATION CONSULT NOTE  Pharmacy Consult for Heparin Indication: atrial fibrillation  No Known Allergies  Patient Measurements: Height: 5' 7 (170.2 cm) Weight: 76.2 kg (168 lb) IBW/kg (Calculated) : 61.6 HEPARIN DW (KG): 76.2  Vital Signs: Temp: 98.3 F (36.8 C) (10/20 1605) BP: 114/80 (10/20 1740)  Labs: Recent Labs    04/21/24 1500  HGB 10.8*  HCT 32.4*  PLT 214  CREATININE 0.70  TROPONINIHS 5    Estimated Creatinine Clearance: 55.7 mL/min (by C-G formula based on SCr of 0.7 mg/dL).   Medical History: Past Medical History:  Diagnosis Date   Anemia    Cancer (HCC)    ovarian ca   Depression    History of cervical cancer 12/29/2014   stage 4, s/p complete hysterectomy    History of mumps as a child    Hyperlipidemia    Hypertension    Osteoarthrosis    knees   Osteopenia    Wears dentures    full upper and lower    Medications:  No history of chronic anticoagulant use PTA  Assessment: 83 y.o. female presents to the emergency department with chest pain and tachycardia.  Patient states that she started to not feel well last night.  States that she started having some chest pain and felt like it was painful to take a deep breath. Atrial fibrillation with RVR seen while on cardiac telemetry. Pharmacy has been consulted to initiate and titrate continuous heparin infusion.  Baseline labs: aPTT pending, INR pending, Hgb 10.8, Plts 214   Goal of Therapy:  Heparin level 0.3-0.7 units/ml Monitor platelets by anticoagulation protocol: Yes   Plan:  Give 4000 units bolus x 1 Start heparin infusion at 1150 units/hr Check anti-Xa level in 8 hours and daily while on heparin Continue to monitor H&H and platelets  Jossiah Smoak A Mariselda Badalamenti 04/21/2024,6:32 PM

## 2024-04-21 NOTE — ED Notes (Signed)
 This tech assisted pt to the toilet at this time.

## 2024-04-21 NOTE — Progress Notes (Addendum)
 Subjective  Patient is a 83 y.o. female with history of HTN here c/w A-fib.  She states she was feeling pain in middle of her chest, her daughter came to visit her and put a small wedge on her, which stated she was in A-fib.  She reports chest pain with inspiration, substernal.  She denies lightheadedness, dizziness, fatigue, nausea, vomiting.  Pain does not radiate.  Review of Systems  Constitutional:  Negative for chills, fatigue and fever.  HENT:  Negative for congestion, ear discharge, rhinorrhea and sore throat.   Eyes:  Negative for discharge and redness.  Respiratory:  Negative for cough, chest tightness, shortness of breath and wheezing.   Cardiovascular:  Positive for chest pain and palpitations. Negative for leg swelling.  Gastrointestinal:  Negative for abdominal pain, diarrhea, nausea and vomiting.  Musculoskeletal:  Negative for arthralgias and myalgias.  Skin:  Negative for rash.  Allergic/Immunologic: Negative for environmental allergies and immunocompromised state.  Neurological:  Negative for light-headedness and headaches.  Hematological:  Negative for adenopathy.  Psychiatric/Behavioral:  Negative for confusion and sleep disturbance.        Current Problem List: There is no problem list on file for this patient.   Current Medications on file: No current outpatient medications on file prior to visit.   No current facility-administered medications on file prior to visit.    Past Medical History: History reviewed. No pertinent past medical history.   Past Surgical History: Past Surgical History:  Procedure Laterality Date  . KNEE SURGERY Left 2023    Social History: Social History   Socioeconomic History  . Marital status: Not on file    Spouse name: Not on file  . Number of children: Not on file  . Years of education: Not on file  . Highest education level: Not on file  Occupational History  . Not on file  Tobacco Use  . Smoking status: Never  .  Smokeless tobacco: Never  Substance and Sexual Activity  . Alcohol use: Not on file  . Drug use: Not on file  . Sexual activity: Not on file  Other Topics Concern  . Not on file  Social History Narrative  . Not on file    Allergies: No Known Allergies  Objective  Vitals:   04/21/24 1424  BP: (!) 142/82  Pulse: (!) 109  Resp: 18  Temp: 36.7 C (98 F)  TempSrc: Tympanic  SpO2: 96%  Weight: 76.2 kg  Height: 5' 7     No results found.      Physical Exam Vitals and nursing note reviewed.  Constitutional:      General: She is not in acute distress.    Appearance: Normal appearance. She is not ill-appearing.  HENT:     Head: Normocephalic and atraumatic.     Nose: Nose normal. No congestion or rhinorrhea.     Mouth/Throat:     Mouth: Mucous membranes are moist.     Pharynx: No oropharyngeal exudate or posterior oropharyngeal erythema.  Eyes:     General: No scleral icterus.    Conjunctiva/sclera: Conjunctivae normal.     Pupils: Pupils are equal, round, and reactive to light.  Cardiovascular:     Rate and Rhythm: Tachycardia present. Rhythm irregularly irregular. Extrasystoles are present.    Heart sounds: No murmur heard. Pulmonary:     Effort: Pulmonary effort is normal. No respiratory distress.     Breath sounds: Normal breath sounds. No wheezing, rhonchi or rales.  Musculoskeletal:  General: Normal range of motion.     Cervical back: Normal range of motion. No rigidity.  Lymphadenopathy:     Cervical: No cervical adenopathy.  Skin:    General: Skin is warm.     Capillary Refill: Capillary refill takes less than 2 seconds.  Neurological:     General: No focal deficit present.     Mental Status: She is alert and oriented to person, place, and time.  Psychiatric:        Mood and Affect: Mood normal.        Behavior: Behavior normal.      Results: Results for orders placed or performed in visit on 04/21/24  ECG 12 lead   Narrative   ECG  Interpretation:  Rhythm: Atrial fibrillation with rapid ventricular response Axis: Normal axis  Intervals: Normal PR interval QRS Complex: Normal ST Segment: Normal ST-T segments QT Interval: Normal  Compared with prior: No, None Available.   Summary of Clinical Condition:  Afib with RVR  Interpretation by Norleen Favorite, PA        Assessment  [name] was seen today for chest pain. Diagnoses and all orders for this visit: Atrial fibrillation with RVR (CMS/HCC) (Primary) -     ECG 12 lead Chest pain, unspecified type -     ECG 12 lead   Sent to emergency department for further evaluation of A-fib with RVR.  This is new onset.  She denies any prior history. Patient declined EMS transportation, she is aware risk of death or disability. Daughter will drive her to ED.     MDM:     1 Acute uncomplicated illness or injury requiring hosptital inpatient or observation level of care     Review of prior notes: Two     Review of any test results: Two     Risk:: High

## 2024-04-21 NOTE — H&P (Signed)
 History and Physical    Lynn Mccoy FMW:[id number] DOB: Jul 11, 1940 DOA: 04/21/2024  Referring MD/NP/PA:   PCP: Gasper Nancyann BRAVO, MD   Patient coming from:  The patient is coming from home.     Chief Complaint: SOB and chest pain  HPI: Lynn Mccoy is a 83 y.o. female with medical history significant of dCHF, HTN, HLD, depression, adjustment disorder, iron deficiency anemia, arthritis, cervical cancer and ovarian cancer (s/p of hysterectomy), who presents with SOB and chest pain.  Patient states that she her symptoms started last night including SOB and chest pain.  She also has mild dry cough, no fever or chills.  Her chest pain is located in the front chest, constant, sharp, nonradiating, initially severe 10 out of 10 severity, currently 4 out of 10 in severity, pleuritic, aggravated by deep breath.  Patient does not have nausea, vomiting, diarrhea or abdominal pain.  No symptoms of UTI.  She states that she has 5 pounds weight gain recently. Patient was seen [facility] today and found to have A-fib, then sent to ED for further evaluation treatment.  Patient denies any rectal bleeding or dark stool.  No recent fall or head injury.  Patient is normally not using oxygen, was found to have 1 episode of oxygen desaturation to 89% on room air in ED, currently 92-99% on room air.  Per ED physician, patient's heart rate is up to 140s, currently 110-120s.   Data reviewed independently and ED Course: pt was found to have BNP 362, TSH 1.42, troponin 5, temperature normal, blood pressure 114/80, RR 29 --> 21.  Chest x-ray showed trace bilateral pleural effusion.  CTA negative for PE but showed cardiomegaly.  Patient is admitted to PCU as inpatient.  Dr. Marshall of card is consulted.    EKG: I have personally reviewed.  A-fib, QTc 455, heart rate 116   Review of Systems:   General: no fevers, chills, no body weight gain, has fatigue HEENT: no blurry vision, hearing changes or sore  throat Respiratory: has dyspnea, coughing, no wheezing CV: has chest pain, no palpitations GI: no nausea, vomiting, abdominal pain, diarrhea, constipation GU: no dysuria, burning on urination, increased urinary frequency, hematuria  Ext: has leg edema Neuro: no unilateral weakness, numbness, or tingling, no vision change or hearing loss Skin: no rash, no skin tear. MSK: No muscle spasm, no deformity, no limitation of range of movement in spin Heme: No easy bruising.  Travel history: No recent long distant travel.   Allergy: No Known Allergies  Past Medical History:  Diagnosis Date   Anemia    Cancer (HCC)    ovarian ca   Depression    History of cervical cancer 12/29/2014   stage 4, s/p complete hysterectomy    History of mumps as a child    Hyperlipidemia    Hypertension    Osteoarthrosis    knees   Osteopenia    Wears dentures    full upper and lower    Past Surgical History:  Procedure Laterality Date   ABDOMINAL HYSTERECTOMY  1977   due to cervical cancer stage 4   APPENDECTOMY  1977   arthritis Bilateral    CATARACT EXTRACTION W/PHACO Left 03/06/2018   Procedure: CATARACT EXTRACTION PHACO AND INTRAOCULAR LENS PLACEMENT (IOC) COMPLICATED LEFT;  Surgeon: Mittie Gaskin, MD;  Location: [facility];  Service: Ophthalmology;  Laterality: Left;  HEALON 5 VISION BLUE CAPSULAR TENSION RING MALYUGIN   COLONOSCOPY     KNEE  ARTHROPLASTY Left 11/14/2021   Procedure: COMPUTER ASSISTED TOTAL KNEE ARTHROPLASTY;  Surgeon: Mardee Lynwood SQUIBB, MD;  Location: [facility];  Service: Orthopedics;  Laterality: Left;   OOPHORECTOMY     REPLACEMENT TOTAL KNEE Right    ROTATOR CUFF REPAIR Right about 2006   Dr. Cleotilde    Social History:  reports that she has never smoked. She has never used smokeless tobacco. She reports current alcohol use. She reports that she does not use drugs.  Family History:  Family History  Problem Relation Age of Onset   Leukemia Mother    Lung  cancer Father    Dementia Sister    Alzheimer's disease Sister    Heart attack Brother    Rheum arthritis Sister    COPD Sister    Breast cancer Neg Hx      Prior to Admission medications   Medication Sig Start Date End Date Taking? Authorizing Provider  alendronate  (FOSAMAX ) 70 MG tablet Take 1 tablet (70 mg total) by mouth every 7 (seven) days. Take with a full glass of water on an empty stomach. 07/31/23   Gasper Nancyann BRAVO, MD  amLODipine  (NORVASC ) 2.5 MG tablet Take 1 tablet (2.5 mg total) by mouth daily. 03/31/24   Gasper Nancyann BRAVO, MD  celecoxib  (CELEBREX ) 100 MG capsule Take 100 mg by mouth 2 (two) times daily. Patient not taking: Reported on 11/06/2022 01/06/22   [provider]  cetirizine (ZYRTEC) 10 MG tablet Take 10 mg by mouth daily.    [provider]  cholecalciferol  (VITAMIN D ) 1000 UNITS tablet Take 1,000 Units by mouth daily.    [provider]  diclofenac Sodium (VOLTAREN) 1 % GEL Apply 1 application. topically at bedtime as needed (knee pain).    [provider]  ferrous sulfate  325 (65 FE) MG EC tablet Take 325 mg by mouth every other day.    [provider]  montelukast  (SINGULAIR ) 10 MG tablet Take 1 tablet (10 mg total) by mouth at bedtime. 05/10/21   Gasper Nancyann BRAVO, MD  NON FORMULARY Take 1 tablet by mouth daily. Neuriva OTC    [provider]  NONFORMULARY OR COMPOUNDED ITEM Take 4 tablets by mouth daily. SeroVital- hgh    [provider]  sertraline  (ZOLOFT ) 100 MG tablet TAKE ONE TABLET EVERY DAY 07/03/23   Gasper Nancyann BRAVO, MD  tiZANidine  (ZANAFLEX ) 4 MG tablet Take 1 tablet (4 mg total) by mouth at bedtime. For stiff neck Patient not taking: Reported on 11/06/2022 07/05/22   Gasper Nancyann BRAVO, MD  traMADol  (ULTRAM ) 50 MG tablet Take 1 tablet (50 mg total) by mouth every 4 (four) hours as needed for moderate pain. Patient not taking: Reported on 11/06/2022 11/15/21   Claudene Katz B, PA-C  traZODone  (DESYREL )  100 MG tablet TAKE ONE-HALF TO ONE TABLET ONE HOUR BEFORE BEDTIME. 07/03/23   Gasper Nancyann BRAVO, MD    Physical Exam: Vitals:   04/21/24 1830 04/21/24 1850 04/21/24 1900 04/21/24 2000  BP: (!) 132/104 124/80 117/89 115/71  Pulse:    92  Resp: (!) 27 (!) 24 (!) 26 18  Temp:    98.5 F (36.9 C)  TempSrc:    Oral  SpO2: 92% 92% (!) 86% 92%  Weight:      Height:       General: Not in acute distress HEENT:       Eyes: PERRL, EOMI, no jaundice       ENT: No discharge from the ears and  nose, no pharynx injection, no tonsillar enlargement.        Neck: Positive JVD, no bruit, no mass felt. Heme: No neck lymph node enlargement. Cardiac: S1/S2, irregularly irregular rhythm, no gallops or rubs. Respiratory: No rales, wheezing, rhonchi or rubs. GI: Soft, nondistended, nontender, no rebound pain, no organomegaly, BS present. GU: No hematuria Ext: has trace leg edema bilaterally. 1+DP/PT pulse bilaterally. Musculoskeletal: No joint deformities, No joint redness or warmth, no limitation of ROM in spin. Skin: No rashes.  Neuro: Alert, oriented X3, cranial nerves II-XII grossly intact, moves all extremities normally. Psych: Patient is not psychotic, no suicidal or hemocidal ideation.  Labs on Admission: I have personally reviewed following labs and imaging studies  CBC: Recent Labs  Lab 04/21/24 1500  WBC 8.2  HGB 10.8*  HCT 32.4*  MCV 92.6  PLT 214   Basic Metabolic Panel: Recent Labs  Lab 04/21/24 1500  NA 142  K 3.6  CL 103  CO2 23  GLUCOSE 103*  BUN 13  CREATININE 0.70  CALCIUM 8.7*   GFR: Estimated Creatinine Clearance: 55.7 mL/min (by C-G formula based on SCr of 0.7 mg/dL). Liver Function Tests: No results for input(s): AST, ALT, ALKPHOS, BILITOT, PROT, ALBUMIN in the last 168 hours. No results for input(s): LIPASE, AMYLASE in the last 168 hours. No results for input(s): AMMONIA in the last 168 hours. Coagulation Profile: Recent Labs  Lab  04/21/24 1829  INR 1.1   Cardiac Enzymes: No results for input(s): CKTOTAL, CKMB, CKMBINDEX, TROPONINI in the last 168 hours. BNP (last 3 results) No results for input(s): PROBNP in the last 8760 hours. HbA1C: No results for input(s): HGBA1C in the last 72 hours. CBG: No results for input(s): GLUCAP in the last 168 hours. Lipid Profile: No results for input(s): CHOL, HDL, LDLCALC, TRIG, CHOLHDL, LDLDIRECT in the last 72 hours. Thyroid  Function Tests: Recent Labs    04/21/24 1500  TSH 1.402   Anemia Panel: No results for input(s): VITAMINB12, FOLATE, FERRITIN, TIBC, IRON, RETICCTPCT in the last 72 hours. Urine analysis:    Component Value Date/Time   COLORURINE YELLOW (A) 11/03/2021 1406   APPEARANCEUR HAZY (A) 11/03/2021 1406   LABSPEC 1.028 11/03/2021 1406   PHURINE 5.0 11/03/2021 1406   GLUCOSEU NEGATIVE 11/03/2021 1406   HGBUR NEGATIVE 11/03/2021 1406   BILIRUBINUR NEGATIVE 11/03/2021 1406   KETONESUR NEGATIVE 11/03/2021 1406   PROTEINUR NEGATIVE 11/03/2021 1406   NITRITE POSITIVE (A) 11/03/2021 1406   LEUKOCYTESUR LARGE (A) 11/03/2021 1406   Sepsis Labs: @LABRCNTIP (procalcitonin:4,lacticidven:4) )No results found for this or any previous visit (from the past 240 hours).   Radiological Exams on Admission:   Assessment/Plan Principal Problem:   Atrial fibrillation with RVR (HCC) Active Problems:   Acute on chronic diastolic CHF (congestive heart failure) (HCC)   Primary hypertension   Atypical chest pain   Iron deficiency anemia   Overweight (BMI 25.0-29.9)   Assessment and Plan:  New onset atrial fibrillation with RVR (HCC): HR 110-140s. TSH normal 1.402.  CHA2DS2-VASc Score is 4, needs oral anticoagulation. Consulted Dr. Wilburn of card.  -will admit to PCU as inp -will start metoprolol to tartrate 25 mg twice daily - Cardizem drip is ordered, but not started yet --> will start if heart rate is not controlled and >  125 - IV heparin started in ED - 2D echo  Acute on chronic diastolic CHF (congestive heart failure) [facility]): Patient only has trace leg edema, but has SOB, elevated BNP 362, positive JVD, oxygen  desaturation, 5 pounds weight gain recently, clinically consistent with CHF exacerbation.  -Lasix 40 mg bid by IV - f/u 2d echo -Daily weights -strict I/O's -Low salt diet -Fluid restriction -As needed bronchodilators for shortness of breath  Primary hypertension -Patient is on IV Lasix and newly started metoprolol as above - Hold amlodipine  to avoid hypotension - IV hydralazine as needed  Atypical chest pain: CT negative for PE.  Troponin 5 --> 5. - Continue home Celebrex  - As needed Percocet and Tylenol   Iron deficiency anemia: Hemoglobin 10.8 (11.6 on 05/09/2023) -Continue iron supplement  Overweight (BMI 25.0-29.9): Body weight 76.2 kg, BMI 26.31 - Encourage losing weight - Exercise and healthy diet     DVT ppx: on IV Heparin    Code Status: Full code per pt and her daughter  Family Communication:   Yes, patient's daughter at bed side.      Disposition Plan:  Anticipate discharge back to previous environment  Consults called: Dr. Wilburn of cardiology  Admission status and Level of care: Progressive:  as inpt        Dispo: The patient is from: Home              Anticipated d/c is to: Home              Anticipated d/c date is: 2 days              Patient currently is not medically stable to d/c.    Severity of Illness:  The appropriate patient status for this patient is INPATIENT. Inpatient status is judged to be reasonable and necessary in order to provide the required intensity of service to ensure the patient's safety. The patient's presenting symptoms, physical exam findings, and initial radiographic and laboratory data in the context of their chronic comorbidities is felt to place them at high risk for further clinical deterioration. Furthermore, it is not  anticipated that the patient will be medically stable for discharge from the hospital within 2 midnights of admission.   * I certify that at the point of admission it is my clinical judgment that the patient will require inpatient hospital care spanning beyond 2 midnights from the point of admission due to high intensity of service, high risk for further deterioration and high frequency of surveillance required.*       Date of Service 04/21/2024    Caleb Exon [facility]   If 7PM-7AM, please contact night-coverage [email or web address] 04/21/2024, 8:33 PM

## 2024-04-21 NOTE — Progress Notes (Signed)
Chest pain yesterday ??

## 2024-04-21 NOTE — ED Triage Notes (Signed)
 Pt comes in via pov with complaints of chest pain and shortness of breath that started last night. Pt was [facility], and was told she was in AFIB, and was sent to the ER. Pt complains of chest pain with inhalation.Pt has no previous history of AFIB. Pt is alert and oriented x4.

## 2024-04-22 ENCOUNTER — Telehealth (HOSPITAL_COMMUNITY): Payer: Self-pay | Admitting: Pharmacy Technician

## 2024-04-22 ENCOUNTER — Inpatient Hospital Stay: Admit: 2024-04-22 | Discharge: 2024-04-22 | Disposition: A | Attending: Student

## 2024-04-22 ENCOUNTER — Other Ambulatory Visit (HOSPITAL_COMMUNITY): Payer: Self-pay

## 2024-04-22 ENCOUNTER — Other Ambulatory Visit: Payer: Self-pay

## 2024-04-22 DIAGNOSIS — I5030 Unspecified diastolic (congestive) heart failure: Secondary | ICD-10-CM | POA: Diagnosis not present

## 2024-04-22 DIAGNOSIS — I4891 Unspecified atrial fibrillation: Secondary | ICD-10-CM | POA: Diagnosis not present

## 2024-04-22 LAB — CBC
HCT: 31.5 % — ABNORMAL LOW (ref 36.0–46.0)
Hemoglobin: 10.5 g/dL — ABNORMAL LOW (ref 12.0–15.0)
MCH: 30.8 pg (ref 26.0–34.0)
MCHC: 33.3 g/dL (ref 30.0–36.0)
MCV: 92.4 fL (ref 80.0–100.0)
Platelets: 200 K/uL (ref 150–400)
RBC: 3.41 MIL/uL — ABNORMAL LOW (ref 3.87–5.11)
RDW: 13.4 % (ref 11.5–15.5)
WBC: 6.5 K/uL (ref 4.0–10.5)
nRBC: 0 % (ref 0.0–0.2)

## 2024-04-22 LAB — BASIC METABOLIC PANEL WITH GFR
Anion gap: 13 (ref 5–15)
BUN: 12 mg/dL (ref 8–23)
CO2: 24 mmol/L (ref 22–32)
Calcium: 8.3 mg/dL — ABNORMAL LOW (ref 8.9–10.3)
Chloride: 102 mmol/L (ref 98–111)
Creatinine, Ser: 0.76 mg/dL (ref 0.44–1.00)
GFR, Estimated: >60 mL/min (ref >60)
Glucose, Bld: 107 mg/dL — ABNORMAL HIGH (ref 70–99)
Potassium: 3.5 mmol/L (ref 3.5–5.1)
Sodium: 139 mmol/L (ref 135–145)

## 2024-04-22 LAB — PHOSPHORUS: Phosphorus: 3.1 mg/dL (ref 2.5–4.6)

## 2024-04-22 LAB — MAGNESIUM: Magnesium: 2.1 mg/dL (ref 1.7–2.4)

## 2024-04-22 LAB — HEPARIN LEVEL (UNFRACTIONATED): Heparin Unfractionated: 0.16 [IU]/mL — ABNORMAL LOW (ref 0.30–0.70)

## 2024-04-22 MED ORDER — METOPROLOL TARTRATE 50 MG PO TABS
50.0000 mg | ORAL_TABLET | Freq: Two times a day (BID) | ORAL | Status: DC
  Administered 2024-04-22: 50 mg via ORAL
  Filled 2024-04-22: qty 1

## 2024-04-22 MED ORDER — HEPARIN BOLUS VIA INFUSION
2300.0000 [IU] | Freq: Once | INTRAVENOUS | Status: AC
  Administered 2024-04-22: 2300 [IU] via INTRAVENOUS
  Filled 2024-04-22: qty 2300

## 2024-04-22 MED ORDER — APIXABAN 5 MG PO TABS
5.0000 mg | ORAL_TABLET | Freq: Two times a day (BID) | ORAL | 11 refills | Status: DC
  Filled 2024-04-22: qty 60, 30d supply, fill #0

## 2024-04-22 MED ORDER — POTASSIUM CHLORIDE 20 MEQ PO PACK
40.0000 meq | PACK | Freq: Once | ORAL | Status: AC
  Administered 2024-04-22: 40 meq via ORAL
  Filled 2024-04-22: qty 2

## 2024-04-22 MED ORDER — METOPROLOL TARTRATE 50 MG PO TABS
50.0000 mg | ORAL_TABLET | Freq: Two times a day (BID) | ORAL | 11 refills | Status: DC
  Filled 2024-04-22: qty 60, 30d supply, fill #0

## 2024-04-22 MED ORDER — APIXABAN 5 MG PO TABS
5.0000 mg | ORAL_TABLET | Freq: Two times a day (BID) | ORAL | Status: DC
  Administered 2024-04-22: 5 mg via ORAL
  Filled 2024-04-22: qty 1

## 2024-04-22 NOTE — Consult Note (Signed)
 PHARMACY - ANTICOAGULATION CONSULT NOTE  Pharmacy Consult for Heparin Indication: atrial fibrillation  No Known Allergies  Patient Measurements: Height: 5' 7 (170.2 cm) Weight: 76.2 kg (168 lb) IBW/kg (Calculated) : 61.6 HEPARIN DW (KG): 76.2  Vital Signs: Temp: 98.6 F (37 C) (10/21 0036) Temp Source: Oral (10/21 0036) BP: 102/85 (10/21 0036) Pulse Rate: 84 (10/21 0036)  Labs: Recent Labs    04/21/24 1500 04/21/24 1737 04/21/24 1829 04/22/24 0259  HGB 10.8*  --   --  10.5*  HCT 32.4*  --   --  31.5*  PLT 214  --   --  200  APTT  --   --  30  --   LABPROT  --   --  14.5  --   INR  --   --  1.1  --   HEPARINUNFRC  --   --   --  0.16*  CREATININE 0.70  --   --  0.76  TROPONINIHS 5 5  --   --     Estimated Creatinine Clearance: 55.7 mL/min (by C-G formula based on SCr of 0.76 mg/dL).   Medical History: Past Medical History:  Diagnosis Date   Anemia    Cancer (HCC)    ovarian ca   Depression    History of cervical cancer 12/29/2014   stage 4, s/p complete hysterectomy    History of mumps as a child    Hyperlipidemia    Hypertension    Osteoarthrosis    knees   Osteopenia    Wears dentures    full upper and lower    Medications:  No history of chronic anticoagulant use PTA  Assessment: 83 y.o. female presents to the emergency department with chest pain and tachycardia.  Patient states that she started to not feel well last night.  States that she started having some chest pain and felt like it was painful to take a deep breath. Atrial fibrillation with RVR seen while on cardiac telemetry. Pharmacy has been consulted to initiate and titrate continuous heparin infusion.  Baseline labs: aPTT pending, INR pending, Hgb 10.8, Plts 214   Goal of Therapy:  Heparin level 0.3-0.7 units/ml Monitor platelets by anticoagulation protocol: Yes  12/21 0259 HL 0.16, subtherapeutic   Plan:  Give 2300 units bolus x 1 Increase heparin infusion to 1350  units/hr Recheck HL in 8 hours after rate change Continue to monitor H&H and platelets  Rankin CANDIE Dills, PharmD, [facility] 04/22/2024 4:32 AM

## 2024-04-22 NOTE — Evaluation (Signed)
 Physical Therapy Evaluation Patient Details Name: Lynn Mccoy MRN: [id number] DOB: 1940/10/13 Today's Date: 04/22/2024  History of Present Illness  presented to ER secondary to SOB, chest pain; admitted for management of afib with RVR  Clinical Impression  Patient resting on stretcher bed upon arrival to room; supportive daughter present at bedside.  Patient alert and oriented, follows commands and agreeable to participation with treatment session.  Generally impulsive with movement/activity, requiring intermittent cuing for safety.  Patient very eager for discharge home. Bilat UE/LE strength and ROM grossly symmetrical and WFL; no focal weakness appreciated.  Denies pain and SOB at rest or with exertion.   Able to complete bed mobility with mod indep; sit/stand, basic transfers without assist device, cga/close sup; gait (150') without assist device, cga/min assist. Demonstrates reciprocal stepping pattern with good step height/length; brisk cadence (slightly impulsive). Mild sway with head turns; single LOB requiring LE step strategy and UE support for recovery. Denies SOB, chest pain with gait efforts; HR 110-120s with gait  Of note, HR does intermittently increase to 140-150 at rest (patient asymptomatic), but returns to resting 90-100s within 5-7 seconds.  Will continue to monitor HR and response to activity in subsequent sessions. Would benefit from skilled PT to address above deficits and promote optimal return to PLOF.; recommend post-acute PT follow up as indicated by interdisciplinary care team.          If plan is discharge home, recommend the following: A little help with walking and/or transfers;A little help with bathing/dressing/bathroom   Can travel by private vehicle        Equipment Recommendations    Recommendations for Other Services       Functional Status Assessment Patient has had a recent decline in their functional status and demonstrates the ability to make  significant improvements in function in a reasonable and predictable amount of time.     Precautions / Restrictions Precautions Precautions: Fall Restrictions Weight Bearing Restrictions Per Provider Order: No      Mobility  Bed Mobility Overal bed mobility: Modified Independent                  Transfers Overall transfer level: Modified independent Equipment used: None                    Ambulation/Gait Ambulation/Gait assistance: Supervision, Contact guard assist Gait Distance (Feet): 150 Feet Assistive device: None         General Gait Details: reciprocal stepping pattern with good step height/length; brisk cadence (slightly impulsive).  Mild sway with head turns; single LOB requiring LE step strategy and UE support for recovery.  Denies SOB, chest pain with gait efforts; HR 110-120s with gait  Stairs            Wheelchair Mobility     Tilt Bed    Modified Rankin (Stroke Patients Only)       Balance Overall balance assessment: Needs assistance Sitting-balance support: No upper extremity supported, Feet supported Sitting balance-Leahy Scale: Good     Standing balance support: No upper extremity supported Standing balance-Leahy Scale: Fair                               Pertinent Vitals/Pain Pain Assessment Pain Assessment: No/denies pain    Home Living Family/patient expects to be discharged to:: Private residence Living Arrangements: Alone Available Help at Discharge: Family Type of Home: House Home Access: Stairs to  enter Entrance Stairs-Rails: None Entrance Stairs-Number of Steps: 1   Home Layout: One level        Prior Function Prior Level of Function : Independent/Modified Independent             Mobility Comments: Indep without assist device for ADLs, household and community mobilization; denies fall history; active, push-mowing own yard       Extremity/Trunk Assessment   Upper Extremity  Assessment Upper Extremity Assessment: Overall WFL for tasks assessed    Lower Extremity Assessment Lower Extremity Assessment: Overall WFL for tasks assessed       Communication   Communication Communication: No apparent difficulties    Cognition Arousal: Alert Behavior During Therapy: WFL for tasks assessed/performed   PT - Cognitive impairments: No apparent impairments                         Following commands: Intact       Cueing       General Comments      Exercises     Assessment/Plan    PT Assessment Patient needs continued PT services  PT Problem List Decreased activity tolerance;Decreased mobility;Decreased balance;Decreased knowledge of use of DME;Decreased safety awareness;Decreased knowledge of precautions;Cardiopulmonary status limiting activity       PT Treatment Interventions DME instruction;Gait training;Stair training;Functional mobility training;Therapeutic activities;Therapeutic exercise;Balance training;Patient/family education    PT Goals (Current goals can be found in the Care Plan section)  Acute Rehab PT Goals Patient Stated Goal: to go home today! PT Goal Formulation: With patient/family Time For Goal Achievement: 05/06/24 Potential to Achieve Goals: Good    Frequency Min 1X/week     Co-evaluation               AM-PAC PT 6 Clicks Mobility  Outcome Measure Help needed turning from your back to your side while in a flat bed without using bedrails?: None Help needed moving from lying on your back to sitting on the side of a flat bed without using bedrails?: None Help needed moving to and from a bed to a chair (including a wheelchair)?: None Help needed standing up from a chair using your arms (e.g., wheelchair or bedside chair)?: None Help needed to walk in hospital room?: A Little Help needed climbing 3-5 steps with a railing? : A Little 6 Click Score: 22    End of Session   Activity Tolerance: Patient  tolerated treatment well Patient left: with call bell/phone within reach;with chair alarm set;with family/visitor present;in chair   PT Visit Diagnosis: Difficulty in walking, not elsewhere classified (R26.2)    Time: 8983-8966 PT Time Calculation (min) (ACUTE ONLY): 17 min   Charges:   PT Evaluation $PT Eval Low Complexity: 1 Low   PT General Charges $$ ACUTE PT VISIT: 1 Visit         Jasiah Elsen H. Delores, PT, DPT, NCS 04/22/24, 10:45 AM [phone]

## 2024-04-22 NOTE — Care Management CC44 (Signed)
 Condition Code 44 Documentation Completed  Patient Details  Name: Lynn Mccoy MRN: [id number] Date of Birth: 09-Nov-1940   Condition Code 44 given:   Yes Patient signature on Condition Code 44 notice:   Yes Documentation of 2 MD's agreement:   Yes Code 44 added to claim:   Yes    Victory Jackquline RAMAN, RN 04/22/2024, 1:15 PM

## 2024-04-22 NOTE — Consult Note (Signed)
 [facility] CLINIC CARDIOLOGY CONSULT NOTE       Patient ID: Lynn Mccoy MRN: [id number] DOB/AGE: September 27, 1940 83 y.o.  Admit date: 04/21/2024 Referring Physician Dr. Caleb Exon Primary Physician Gasper, Nancyann BRAVO, MD  Primary Cardiologist None Reason for Consultation AF RVR, elevated BNP  HPI: Lynn Mccoy is a 83 y.o. female  with a past medical history of dCHF, HTN, HLD, depression, adjustment disorder, iron deficiency anemia, arthritis, cervical cancer and ovarian cancer (s/p of hysterectomy) who presented to the ED on 04/21/2024 for pleuritic chest pain. Found to be in AF RVR. Cardiology was consulted for further evaluation.   Patient reports that 1 to 2 days ago she began having chest pain which was worse with taking a deep breath as well as shortness of breath.  Given this she decided to come to the ED for further evaluation.  Workup in the ED notable for creatinine 0.7, potassium 3.6, hemoglobin 10.8, WBC 8.2. Troponins 5 > 5, BNP 362. EKG in the ED AF RVR rate 116 bpm.  CTA chest negative for PE, noted trace bilateral pleural effusions.  Started on IV Lasix in the ED.  At the time my evaluation this morning patient is sitting upright on side of ED stretcher.  We discussed her symptoms in further detail.  She endorses pleuritic chest pain which is now resolved.  Also states that she experienced shortness of breath with exertion once 2 days ago.  States that she lives alone and overall is relatively independent.  When asked about palpitations she does not state that she can feel her heart racing.  Review of systems complete and found to be negative unless listed above    Past Medical History:  Diagnosis Date   Anemia    Cancer (HCC)    ovarian ca   Depression    History of cervical cancer 12/29/2014   stage 4, s/p complete hysterectomy    History of mumps as a child    Hyperlipidemia    Hypertension    Osteoarthrosis    knees   Osteopenia    Wears dentures    full upper and  lower    Past Surgical History:  Procedure Laterality Date   ABDOMINAL HYSTERECTOMY  1977   due to cervical cancer stage 4   APPENDECTOMY  1977   arthritis Bilateral    CATARACT EXTRACTION W/PHACO Left 03/06/2018   Procedure: CATARACT EXTRACTION PHACO AND INTRAOCULAR LENS PLACEMENT (IOC) COMPLICATED LEFT;  Surgeon: Mittie Gaskin, MD;  Location: [facility];  Service: Ophthalmology;  Laterality: Left;  HEALON 5 VISION BLUE CAPSULAR TENSION RING MALYUGIN   COLONOSCOPY     KNEE ARTHROPLASTY Left 11/14/2021   Procedure: COMPUTER ASSISTED TOTAL KNEE ARTHROPLASTY;  Surgeon: Mardee Lynwood SQUIBB, MD;  Location: [facility];  Service: Orthopedics;  Laterality: Left;   OOPHORECTOMY     REPLACEMENT TOTAL KNEE Right    ROTATOR CUFF REPAIR Right about 2006   Dr. Cleotilde    (Not in a hospital admission)  Social History   Socioeconomic History   Marital status: Widowed    Spouse name: Not on file   Number of children: 3   Years of education: 54   Highest education level: 12th grade  Occupational History   Occupation: Retired    Comment: former Scientist, research (medical)  Tobacco Use   Smoking status: Never   Smokeless tobacco: Never  Vaping Use   Vaping status: Never Used  Substance and Sexual Activity   Alcohol use: Yes  Alcohol/week: 0.0 - 1.0 standard drinks of alcohol   Drug use: No   Sexual activity: Not Currently  Other Topics Concern   Not on file  Social History Narrative   Lives alone   Social Drivers of Health   Financial Resource Strain: Low Risk  (12/18/2023)   Overall Financial Resource Strain (CARDIA)    Difficulty of Paying Living Expenses: Not hard at all  Food Insecurity: No Food Insecurity (12/18/2023)   Hunger Vital Sign    Worried About Running Out of Food in the Last Year: Never true    Ran Out of Food in the Last Year: Never true  Transportation Needs: No Transportation Needs (12/18/2023)   PRAPARE - Administrator, Civil Service (Medical): No    Lack  of Transportation (Non-Medical): No  Physical Activity: Insufficiently Active (12/18/2023)   Exercise Vital Sign    Days of Exercise per Week: 2 days    Minutes of Exercise per Session: 40 min  Stress: No Stress Concern Present (12/18/2023)   [employer] of Occupational Health - Occupational Stress Questionnaire    Feeling of Stress: Only a little  Social Connections: Moderately Isolated (12/18/2023)   Social Connection and Isolation Panel    Frequency of Communication with Friends and Family: More than three times a week    Frequency of Social Gatherings with Friends and Family: More than three times a week    Attends Religious Services: 1 to 4 times per year    Active Member of [employer] or Organizations: No    Attends Banker Meetings: Not on file    Marital Status: Widowed  Intimate Partner Violence: Not At Risk (03/07/2023)   Humiliation, Afraid, Rape, and Kick questionnaire    Fear of Current or Ex-Partner: No    Emotionally Abused: No    Physically Abused: No    Sexually Abused: No    Family History  Problem Relation Age of Onset   Leukemia Mother    Lung cancer Father    Dementia Sister    Alzheimer's disease Sister    Heart attack Brother    Rheum arthritis Sister    COPD Sister    Breast cancer Neg Hx      Vitals:   04/22/24 0036 04/22/24 0300 04/22/24 0430 04/22/24 0530  BP: 102/85 (!) 79/67 92/72 (!) 122/57  Pulse: 84  84   Resp: (!) 29 19 14 18   Temp: 98.6 F (37 C)  98.7 F (37.1 C)   TempSrc: Oral  Oral   SpO2: 97% 93% 95% 95%  Weight:      Height:        PHYSICAL EXAM General: Well appearing female, well nourished, in no acute distress. HEENT: Normocephalic and atraumatic. Neck: No JVD.  Lungs: Normal respiratory effort on room air. Clear bilaterally to auscultation. No wheezes, crackles, rhonchi.  Heart: Irregularly irregular, elevated rate. Normal S1 and S2 without gallops or murmurs.  Abdomen: Non-distended appearing.  Msk: Normal  strength and tone for age. Extremities: Warm and well perfused. No clubbing, cyanosis. No edema.  Neuro: Alert and oriented X 3. Psych: Answers questions appropriately.   Labs: Basic Metabolic Panel: Recent Labs    04/21/24 1500 04/21/24 1737 04/22/24 0259  NA 142  --  139  K 3.6  --  3.5  CL 103  --  102  CO2 23  --  24  GLUCOSE 103*  --  107*  BUN 13  --  12  CREATININE 0.70  --  0.76  CALCIUM 8.7*  --  8.3*  MG  --  1.9  --    Liver Function Tests: No results for input(s): AST, ALT, ALKPHOS, BILITOT, PROT, ALBUMIN in the last 72 hours. No results for input(s): LIPASE, AMYLASE in the last 72 hours. CBC: Recent Labs    04/21/24 1500 04/22/24 0259  WBC 8.2 6.5  HGB 10.8* 10.5*  HCT 32.4* 31.5*  MCV 92.6 92.4  PLT 214 200   Cardiac Enzymes: Recent Labs    04/21/24 1500 04/21/24 1737  TROPONINIHS 5 5   BNP: Recent Labs    04/21/24 1500  BNP 362.7*   D-Dimer: No results for input(s): DDIMER in the last 72 hours. Hemoglobin A1C: No results for input(s): HGBA1C in the last 72 hours. Fasting Lipid Panel: No results for input(s): CHOL, HDL, LDLCALC, TRIG, CHOLHDL, LDLDIRECT in the last 72 hours. Thyroid  Function Tests: Recent Labs    04/21/24 1500  TSH 1.402   Anemia Panel: No results for input(s): VITAMINB12, FOLATE, FERRITIN, TIBC, IRON, RETICCTPCT in the last 72 hours.   Radiology: CT Angio Chest Pulmonary Embolism (PE) W or WO Contrast Result Date: 04/21/2024 CLINICAL DATA:  Chest pain and shortness of breath since yesterday, atrial fibrillation EXAM: CT ANGIOGRAPHY CHEST WITH CONTRAST TECHNIQUE: Multidetector CT imaging of the chest was performed using the standard protocol during bolus administration of intravenous contrast. Multiplanar CT image reconstructions and MIPs were obtained to evaluate the vascular anatomy. RADIATION DOSE REDUCTION: This exam was performed according to the departmental  dose-optimization program which includes automated exposure control, adjustment of the mA and/or kV according to patient size and/or use of iterative reconstruction technique. CONTRAST:  75mL OMNIPAQUE IOHEXOL 350 MG/ML SOLN COMPARISON:  04/21/2024 FINDINGS: Cardiovascular: This is a technically adequate evaluation of the pulmonary vasculature. No filling defects or pulmonary emboli. Mild cardiomegaly without pericardial effusion. No evidence of thoracic aortic aneurysm or dissection. Atherosclerosis of the aorta and coronary vasculature. Mediastinum/Nodes: No enlarged mediastinal, hilar, or axillary lymph nodes. Thyroid  gland, trachea, and esophagus demonstrate no significant findings. Small hiatal hernia. Lungs/Pleura: Trace bilateral pleural effusions, right greater than left. No airspace disease or pneumothorax. The central airways are patent. Upper Abdomen: No acute abnormality. Musculoskeletal: No acute or destructive bony abnormalities. Bilateral shoulder osteoarthritis, right greater than left. Reconstructed images demonstrate no additional findings. Review of the MIP images confirms the above findings. IMPRESSION: 1. No evidence of pulmonary embolus. 2. Trace bilateral pleural effusions. 3. Cardiomegaly without pericardial effusion. 4. Aortic Atherosclerosis (ICD10-I70.0). Coronary artery atherosclerosis. Electronically Signed   By: Ozell Daring M.D.   On: 04/21/2024 17:37   DG Chest 2 View Result Date: 04/21/2024 EXAM: 2 VIEW(S) XRAY OF THE CHEST 04/21/2024 03:31:00 PM COMPARISON: None available. CLINICAL HISTORY: chest pain. Chest pain chest pain. Chest pain FINDINGS: LUNGS AND PLEURA: Blunting of bilateral costophrenic angles. No focal pulmonary opacity. No pulmonary edema. No pneumothorax. HEART AND MEDIASTINUM: No acute abnormality of the cardiac and mediastinal silhouettes. BONES AND SOFT TISSUES: Suture anchor identified in the right humeral head. Multilevel endplate degenerative changes and  disc space narrowing within the thoracic spine. IMPRESSION: 1. Blunting of bilateral costophrenic angles which may reflect trace bilateral pleural effusions. Electronically signed by: Waddell Calk MD 04/21/2024 03:59 PM EDT RP Workstation: [id number]    ECHO ordered  TELEMETRY (personally reviewed): atrial fibrillation rate 110s  EKG (personally reviewed): AF RVR rate 116 bpm  Data reviewed by me 04/22/2024: last 24h vitals tele labs imaging I/O  ED provider note, admission H&P  Principal Problem:   Atrial fibrillation with RVR (HCC) Active Problems:   Overweight (BMI 25.0-29.9)   Primary hypertension   Iron deficiency anemia   Acute on chronic diastolic CHF (congestive heart failure) (HCC)   Atypical chest pain    ASSESSMENT AND PLAN:  Lynn Mccoy is a 83 y.o. female  with a past medical history of dCHF, HTN, HLD, depression, adjustment disorder, iron deficiency anemia, arthritis, cervical cancer and ovarian cancer (s/p of hysterectomy) who presented to the ED on 04/21/2024 for pleuritic chest pain. Found to be in AF RVR. Cardiology was consulted for further evaluation.   # Atrial fibrillation RVR # New onset atrial fibrillation # Chronic HFpEF Patient with pleuritic chest pain, shortness of breath for 1 to 2 days.  EKG on admission with atrial fibrillation RVR, this is new for her.  Also noted to have elevated BNP at 362. - Echo ordered.  - Continue IV Lasix 40 mg twice daily. - Increase metoprolol to 50 mg twice daily. - Start Eliquis 5 mg twice daily. - Discussed with patient that we would recommend TEE/cardioversion tomorrow. She repeatedly states she would like to go home today. Explained that she should at least stay for echo and for HR control to be better.   This patient's plan of care was discussed and created with Dr. Wilburn and he is in agreement.  Signed: Danita Bloch, PA-C  04/22/2024, 7:45 AM [facility] Card[facility]

## 2024-04-22 NOTE — Progress Notes (Signed)
 Heart Failure Navigator Progress Note  Assessed for Heart & Vascular TOC clinic readiness.  Patient does not meet criteria due to current [facility] Cardiology patient.  Navigator will sign off att this time.   Charmaine Pines, RN, BSN [facility] Heart Failure Navigator Secure Chat Only

## 2024-04-22 NOTE — Care Management Obs Status (Signed)
 MEDICARE OBSERVATION STATUS NOTIFICATION   Patient Details  Name: Lynn Mccoy MRN: [id number] Date of Birth: 1941/05/08   Medicare Observation Status Notification Given:   Yes    Victory Jackquline RAMAN, RN 04/22/2024, 1:14 PM

## 2024-04-22 NOTE — Evaluation (Signed)
 Occupational Therapy Evaluation Patient Details Name: Lynn Mccoy MRN: [id number] DOB: [birth year] Today's Date: 04/22/2024   History of Present Illness   presented to ER secondary to SOB, chest pain; admitted for management of afib with RVR     Clinical Impressions Ms Guerrieri was seen for OT evaluation this date. Prior to hospital admission, pt was IND including driving and mowing the lawn. Pt lives alone.  Pt currently requires SUPERVISION for functional mobility ~400 ft, mild balance deficits noted, self-corrects. SpO2 86-90% on RA with activity, resolves with standing rest break. HR 88-156 bpm with activity. Educated on ECS including DME recs, PLB, importance of rest breaks, and use of pulse ox to monitor vitals. Education complete, will sign off. Upon hospital discharge, recommend no OT follow up.      If plan is discharge home, recommend the following:   Help with stairs or ramp for entrance     Functional Status Assessment   Patient has not had a recent decline in their functional status     Equipment Recommendations   None recommended by OT     Recommendations for Other Services         Precautions/Restrictions   Precautions Precautions: None Recall of Precautions/Restrictions: Intact Restrictions Weight Bearing Restrictions Per Provider Order: No     Mobility Bed Mobility               General bed mobility comments: not tested    Transfers Overall transfer level: Independent Equipment used: None                      Balance Overall balance assessment: Needs assistance Sitting-balance support: No upper extremity supported, Feet supported Sitting balance-Leahy Scale: Normal     Standing balance support: No upper extremity supported Standing balance-Leahy Scale: Good                             ADL either performed or assessed with clinical judgement   ADL Overall ADL's : Modified independent                                        General ADL Comments: increased time don B shoes in sitting      Pertinent Vitals/Pain Pain Assessment Pain Assessment: No/denies pain     Extremity/Trunk Assessment Upper Extremity Assessment Upper Extremity Assessment: Overall WFL for tasks assessed   Lower Extremity Assessment Lower Extremity Assessment: Overall WFL for tasks assessed       Communication Communication Communication: No apparent difficulties   Cognition Arousal: Alert Behavior During Therapy: WFL for tasks assessed/performed Cognition: No apparent impairments                               Following commands: Intact       Cueing  General Comments      SpO2 86-90% on RA with activity, resolves with standing rest break. HR 88-156 bpm with activity           Home Living Family/patient expects to be discharged to:: Private residence Living Arrangements: Alone Available Help at Discharge: Family Type of Home: House Home Access: Stairs to enter [employer] of Steps: 1 Entrance Stairs-Rails: None Home Layout: One level  Prior Functioning/Environment Prior Level of Function : Independent/Modified Independent;Driving             Mobility Comments: Indep without assist device for ADLs, household and community mobilization; denies fall history; active, push-mowing own yard      OT Problem List: Decreased activity tolerance        OT Goals(Current goals can be found in the care plan section)   Acute Rehab OT Goals Patient Stated Goal: to go home OT Goal Formulation: With patient Time For Goal Achievement: 04/22/24 Potential to Achieve Goals: Good   AM-PAC OT 6 Clicks Daily Activity     Outcome Measure Help from another person eating meals?: None Help from another person taking care of personal grooming?: None Help from another person toileting, which includes using toliet, bedpan, or  urinal?: None Help from another person bathing (including washing, rinsing, drying)?: None Help from another person to put on and taking off regular upper body clothing?: None Help from another person to put on and taking off regular lower body clothing?: None 6 Click Score: 24   End of Session    Activity Tolerance: Patient tolerated treatment well Patient left: in chair;with call bell/phone within reach;with family/visitor present  OT Visit Diagnosis: Unsteadiness on feet (R26.81)                Time: 8881-8873 OT Time Calculation (min): 8 min Charges:  OT General Charges $OT Visit: 1 Visit OT Evaluation $OT Eval Low Complexity: 1 Low  Elston Slot, M.S. OTR/L  04/22/24, 11:50 AM  ascom [phone]

## 2024-04-22 NOTE — Telephone Encounter (Signed)
 Patient Product/process development scientist completed.    The patient is insured through [facility]. Patient has Medicare and is not eligible for a copay card, but may be able to apply for patient assistance or Medicare RX Payment Plan (Patient Must reach out to their plan, if eligible for payment plan), if available.    Ran test claim for Eliquis 5 mg and the current 30 day co-pay is $302.00 due to a $255.00 deductible.  Will be $47.00 once deductible is met.   This test claim was processed through [facility]- copay amounts may vary at other pharmacies due to pharmacy/plan contracts, or as the patient moves through the different stages of their insurance plan.     Reyes Sharps, CPHT Pharmacy Technician Patient Advocate Specialist [facility] Patient Advocate Team Direct Number: [phone]  Fax: [phone]

## 2024-04-22 NOTE — TOC Transition Note (Signed)
 Transition of Care [facility]) - Discharge Note   Patient Details  Name: Lynn Mccoy MRN: [id number] Date of Birth: 09/07/1940  Transition of Care [facility]) CM/SW Contact:  Victory Jackquline RAMAN, RN Phone Number: 04/22/2024, 1:20 PM   Clinical Narrative:   Chart reviewed. RNCM, spoke with the patient and duaghter at the bedside. I introduced myself, my role, and explained that discharge planning recommendations would be discussed and explained the Code 44 Letter. Copy of Code 44 Letter given to patient. Patient upset about Code 82 and stating that she will not be going to the follow up appointment with the cardiologist. Daughter reassured me that she would be taking her mother to the appointment. PT recommended Home with Home Health/PT. Pt declined HH/PT.  Patient has discharge orders for today. No further concerns. RNCM Signing off.    Final next level of care: Home w Home Health Services Barriers to Discharge: No Barriers Identified   Patient Goals and CMS Choice            Discharge Placement                Patient to be transferred to facility by: Daughter Name of family member notified: Daughter Olam with Pt in ED Patient and family notified of of transfer: 04/22/24  Discharge Plan and Services Additional resources added to the After Visit Summary for                                       Social Drivers of Health (SDOH) Interventions SDOH Screenings   Food Insecurity: No Food Insecurity (12/18/2023)  Housing: Low Risk  (12/18/2023)  Transportation Needs: No Transportation Needs (12/18/2023)  Utilities: Not At Risk (03/06/2023)  Alcohol Screen: Low Risk  (11/06/2022)  Depression (PHQ2-9): Low Risk  (03/07/2023)  Financial Resource Strain: Low Risk  (12/18/2023)  Physical Activity: Insufficiently Active (12/18/2023)  Social Connections: Moderately Isolated (12/18/2023)  Stress: No Stress Concern Present (12/18/2023)  Tobacco Use: Low Risk  (04/21/2024)  Health Literacy:  Adequate Health Literacy (03/07/2023)     Readmission Risk Interventions     No data to display

## 2024-04-22 NOTE — Discharge Summary (Signed)
 [facility] Discharge Summary   Patient: SAGAN WURZEL FMW:[id number]  PCP: Gasper Nancyann BRAVO, MD  Date of admission: 04/21/2024   Date of discharge:  04/22/2024     Discharge Diagnoses:  Principal Problem:   Atrial fibrillation with RVR (HCC) Active Problems:   Acute on chronic diastolic CHF (congestive heart failure) (HCC)   Primary hypertension   Atypical chest pain   Iron deficiency anemia   Overweight (BMI 25.0-29.9)   Admitted From: Home Disposition:  Home   Recommendations for Outpatient Follow-up:  PCP: In 1 week Follow-up with cardiology in 1 week Follow up LABS/TEST: TEE and cardioversion as an outpatient as per cards   Follow-up Information     Alluri, Keller BROCKS, MD. Go in 1 week(s).   Specialty: Cardiology Contact information: [address] [place] [address] [phone]         Gasper Nancyann BRAVO, MD Follow up in 1 week(s).   Specialty: Family Medicine Contact information: [address] [place] [address] [phone]                Diet recommendation: Cardiac diet  Activity: The patient is advised to gradually reintroduce usual activities, as tolerated  Discharge Condition: stable  Code Status: Full code   History of present illness: As per the H and P dictated on admission.  Hospital Course:  LAMONA EIMER is a 83 y.o. female with medical history significant of dCHF, HTN, HLD, depression, adjustment disorder, iron deficiency anemia, arthritis, cervical cancer and ovarian cancer (s/p of hysterectomy), who presents with SOB and chest pain.   Patient states that she her symptoms started last night including SOB and chest pain.  She also has mild dry cough, no fever or chills.  Her chest pain is located in the front chest, constant, sharp, nonradiating, initially severe 10 out of 10 severity, currently 4 out of 10 in severity, pleuritic, aggravated by deep breath.  Patient does not have nausea, vomiting,  diarrhea or abdominal pain.  No symptoms of UTI.  She states that she has 5 pounds weight gain recently. Patient was seen in [facility] today and found to have A-fib, then sent to ED for further evaluation treatment.  Patient denies any rectal bleeding or dark stool.  No recent fall or head injury.   Patient is normally not using oxygen, was found to have 1 episode of oxygen desaturation to 89% on room air in ED, currently 92-99% on room air.  Per ED physician, patient's heart rate is up to 140s, currently 110-120s.     Data reviewed independently and ED Course: pt was found to have BNP 362, TSH 1.42, troponin 5, temperature normal, blood pressure 114/80, RR 29 --> 21.  Chest x-ray showed trace bilateral pleural effusion.  CTA negative for PE but showed cardiomegaly.  Patient is admitted to PCU as inpatient.  Dr. Marshall of card is consulted.      EKG: I have personally reviewed.  A-fib, QTc 455, heart rate 116    Assessment and Plan:   New onset atrial fibrillation with RVR (HCC): HR 110-140s. TSH normal 1.402.  CHA2DS2-VASc Score is 4, needs oral anticoagulation. Consulted Dr. Wilburn of card. S/p heparin IV infusion, transition to Eliquis 5 mg p.o. twice daily. Increase metoprolol tartrate to 50 mg p.o. twice daily. Patient was seen by cardiology, recommended to continue to monitor for rate control and TEE and cardioversion.  Patient wanted to go home and follow-up with cardiology as  an outpatient for cardioversion. The patient was discharged home. Patient was advised to stay but she was adamant to leave due to some personal work and wanted to follow-up as an outpatient.  Patient verbalized understanding that she is at high risk for readmission and verbalized understanding risk of leaving the hospital.    # Acute on chronic diastolic CHF (congestive heart failure): Patient only has trace leg edema, but has SOB, elevated BNP 362, positive JVD, oxygen desaturation, 5 pounds weight gain recently,  clinically consistent with CHF exacerbation.  S/p Lasix 40 mg bid by IV. TTE pending.  Recommended fluid striction and low-salt diet.  Follow-up with cardiology in 1 week    # Essential hypertension  Increase metoprolol to 50 mg p.o. twice daily Monitor BP at home and follow with PCP and cardiology as outpatient repeat   # Atypical chest pain: CT negative for PE.  Troponin 5 --> 5. Continue Tylenol  as needed.  Discontinued Celebrex , patient was started on Eliquis for A-fib with RVR.  # Iron deficiency anemia: Hemoglobin 10.8 (11.6 on 05/09/2023) -Continue iron supplement   # Depression, continued Zoloft  and trazodone  Home dose   Body mass index is 26.31 kg/m.  Nutrition Interventions:  - Patient was instructed, not to drive, operate heavy machinery, perform activities at heights, swimming or participation in water activities or provide baby sitting services while on Pain, Sleep and Anxiety Medications; until her outpatient Physician has advised to do so again.  - Also recommended to not to take more than prescribed Pain, Sleep and Anxiety Medications.  Patient was ambulatory without any assistance.  On the day of the discharge the patient's still has irregular rhythm, heart rate is not well-controlled.  Patient was advised to stay for further monitoring to optimize the dose for the rate control but patient wanted to go home so she was discharged.  Patient verbalized the risk and benefit of leaving the hospital. Patient remains at high risk for readmission.  Consultants: Cardiology Procedures: None  Discharge Exam: General: Appear in no distress, Oral Mucosa Clear, moist. Cardiovascular: S1 and S2 Present, irregular rhythm, no Murmur, Respiratory: normal respiratory effort, Bilateral Air entry present and no Crackles, no wheezes Abdomen: Bowel Sound present, Soft and no tenderness. Extremities: no Pedal edema, no calf tenderness Neurology: alert and oriented to time, place, and  person affect appropriate.  Filed Weights   04/21/24 1449  Weight: 76.2 kg   Vitals:   04/22/24 0909 04/22/24 1058  BP:  120/84  Pulse:  95  Resp:  17  Temp: 98.5 F (36.9 C) 98 F (36.7 C)  SpO2:  96%    DISCHARGE MEDICATION: Allergies as of 04/22/2024   No Known Allergies      Medication List     STOP taking these medications    amLODipine  2.5 MG tablet Commonly known as: NORVASC    celecoxib  100 MG capsule Commonly known as: CELEBREX    diclofenac Sodium 1 % Gel Commonly known as: VOLTAREN   montelukast  10 MG tablet Commonly known as: SINGULAIR    tiZANidine  4 MG tablet Commonly known as: ZANAFLEX    traMADol  50 MG tablet Commonly known as: ULTRAM        TAKE these medications    alendronate  70 MG tablet Commonly known as: FOSAMAX  Take 1 tablet (70 mg total) by mouth every 7 (seven) days. Take with a full glass of water on an empty stomach.   apixaban 5 MG Tabs tablet Commonly known as: ELIQUIS Take 1 tablet (5 mg total)  by mouth 2 (two) times daily.   cetirizine 10 MG tablet Commonly known as: ZYRTEC Take 10 mg by mouth daily.   cholecalciferol  1000 units tablet Commonly known as: VITAMIN D  Take 1,000 Units by mouth daily.   ferrous sulfate  325 (65 FE) MG EC tablet Take 325 mg by mouth every other day.   metoprolol tartrate 50 MG tablet Commonly known as: LOPRESSOR Take 1 tablet (50 mg total) by mouth 2 (two) times daily.   NON FORMULARY Take 1 tablet by mouth daily. Neuriva OTC   NONFORMULARY OR COMPOUNDED ITEM Take 4 tablets by mouth daily. SeroVital- hgh   sertraline  100 MG tablet Commonly known as: ZOLOFT  TAKE ONE TABLET EVERY DAY   traZODone  100 MG tablet Commonly known as: DESYREL  TAKE ONE-HALF TO ONE TABLET ONE HOUR BEFORE BEDTIME.       No Known Allergies Discharge Instructions     Call MD for:  difficulty breathing, headache or visual disturbances   Complete by: As directed    Call MD for:  extreme fatigue    Complete by: As directed    Call MD for:  persistant dizziness or light-headedness   Complete by: As directed    Call MD for:  persistant nausea and vomiting   Complete by: As directed    Call MD for:  severe uncontrolled pain   Complete by: As directed    Call MD for:  temperature >100.4   Complete by: As directed    Diet - low sodium heart healthy   Complete by: As directed    Discharge instructions   Complete by: As directed    F/u with PCP in 1 wk F/u with Cards in 1 wk   Increase activity slowly   Complete by: As directed        The results of significant diagnostics from this hospitalization (including imaging, microbiology, ancillary and laboratory) are listed below for reference.    Significant Diagnostic Studies: CT Angio Chest Pulmonary Embolism (PE) W or WO Contrast Result Date: 04/21/2024 CLINICAL DATA:  Chest pain and shortness of breath since yesterday, atrial fibrillation EXAM: CT ANGIOGRAPHY CHEST WITH CONTRAST TECHNIQUE: Multidetector CT imaging of the chest was performed using the standard protocol during bolus administration of intravenous contrast. Multiplanar CT image reconstructions and MIPs were obtained to evaluate the vascular anatomy. RADIATION DOSE REDUCTION: This exam was performed according to the departmental dose-optimization program which includes automated exposure control, adjustment of the mA and/or kV according to patient size and/or use of iterative reconstruction technique. CONTRAST:  75mL OMNIPAQUE IOHEXOL 350 MG/ML SOLN COMPARISON:  04/21/2024 FINDINGS: Cardiovascular: This is a technically adequate evaluation of the pulmonary vasculature. No filling defects or pulmonary emboli. Mild cardiomegaly without pericardial effusion. No evidence of thoracic aortic aneurysm or dissection. Atherosclerosis of the aorta and coronary vasculature. Mediastinum/Nodes: No enlarged mediastinal, hilar, or axillary lymph nodes. Thyroid  gland, trachea, and esophagus  demonstrate no significant findings. Small hiatal hernia. Lungs/Pleura: Trace bilateral pleural effusions, right greater than left. No airspace disease or pneumothorax. The central airways are patent. Upper Abdomen: No acute abnormality. Musculoskeletal: No acute or destructive bony abnormalities. Bilateral shoulder osteoarthritis, right greater than left. Reconstructed images demonstrate no additional findings. Review of the MIP images confirms the above findings. IMPRESSION: 1. No evidence of pulmonary embolus. 2. Trace bilateral pleural effusions. 3. Cardiomegaly without pericardial effusion. 4. Aortic Atherosclerosis (ICD10-I70.0). Coronary artery atherosclerosis. Electronically Signed   By: Ozell Daring M.D.   On: 04/21/2024 17:37   DG Chest  2 View Result Date: 04/21/2024 EXAM: 2 VIEW(S) XRAY OF THE CHEST 04/21/2024 03:31:00 PM COMPARISON: None available. CLINICAL HISTORY: chest pain. Chest pain chest pain. Chest pain FINDINGS: LUNGS AND PLEURA: Blunting of bilateral costophrenic angles. No focal pulmonary opacity. No pulmonary edema. No pneumothorax. HEART AND MEDIASTINUM: No acute abnormality of the cardiac and mediastinal silhouettes. BONES AND SOFT TISSUES: Suture anchor identified in the right humeral head. Multilevel endplate degenerative changes and disc space narrowing within the thoracic spine. IMPRESSION: 1. Blunting of bilateral costophrenic angles which may reflect trace bilateral pleural effusions. Electronically signed by: Waddell Calk MD 04/21/2024 03:59 PM EDT RP Workstation: [id number]    Microbiology: No results found for this or any previous visit (from the past 240 hours).   Labs: CBC: Recent Labs  Lab 04/21/24 1500 04/22/24 0259  WBC 8.2 6.5  HGB 10.8* 10.5*  HCT 32.4* 31.5*  MCV 92.6 92.4  PLT 214 200   Basic Metabolic Panel: Recent Labs  Lab 04/21/24 1500 04/21/24 1737 04/22/24 0259  NA 142  --  139  K 3.6  --  3.5  CL 103  --  102  CO2 23  --  24   GLUCOSE 103*  --  107*  BUN 13  --  12  CREATININE 0.70  --  0.76  CALCIUM 8.7*  --  8.3*  MG  --  1.9 2.1  PHOS  --   --  3.1   Liver Function Tests: No results for input(s): AST, ALT, ALKPHOS, BILITOT, PROT, ALBUMIN in the last 168 hours. No results for input(s): LIPASE, AMYLASE in the last 168 hours. No results for input(s): AMMONIA in the last 168 hours. Cardiac Enzymes: No results for input(s): CKTOTAL, CKMB, CKMBINDEX, TROPONINI in the last 168 hours. BNP (last 3 results) Recent Labs    04/21/24 1500  BNP 362.7*   CBG: No results for input(s): GLUCAP in the last 168 hours.  Time spent: 35 minutes  Signed:  Elvan Sor  [facility] 04/22/2024 11:12 AM

## 2024-04-23 ENCOUNTER — Telehealth: Payer: Self-pay

## 2024-04-23 NOTE — Transitions of Care (Post Inpatient/ED Visit) (Signed)
 04/23/2024  Name: Lynn Mccoy MRN: [id number] DOB: [birth year]  Today's TOC FU Call Status: Today's TOC FU Call Status:: Successful TOC FU Call Completed TOC FU Call Complete Date: 04/23/24 Patient's Name and Date of Birth confirmed.  Transition Care Management Follow-up Telephone Call Date of Discharge: 04/22/24 Discharge Facility: [facility]) Type of Discharge: Inpatient Admission Primary Inpatient Discharge Diagnosis:: afib How have you been since you were released from the hospital?: Better Any questions or concerns?: No  Items Reviewed: Did you receive and understand the discharge instructions provided?: Yes Medications obtained,verified, and reconciled?: Yes (Medications Reviewed) Any new allergies since your discharge?: No Dietary orders reviewed?: Yes Do you have support at home?: No  Medications Reviewed Today: Medications Reviewed Today     Reviewed by Emmitt Pan, LPN (Licensed Practical Nurse) on 04/23/24 at 1147  Med List Status: <None>   Medication Order Taking? Sig Documenting Provider Last Dose Status Informant  alendronate  (FOSAMAX ) 70 MG tablet [id number] Yes Take 1 tablet (70 mg total) by mouth every 7 (seven) days. Take with a full glass of water on an empty stomach. Gasper Nancyann BRAVO, MD  Active Self, Pharmacy Records  apixaban [facility]) 5 MG TABS tablet [id number] Yes Take 1 tablet (5 mg total) by mouth 2 (two) times daily. Von Bellis, MD  Active   cetirizine (ZYRTEC) 10 MG tablet [id number] Yes Take 10 mg by mouth daily. [provider]  Active Self, Pharmacy Records  cholecalciferol  (VITAMIN D ) 1000 UNITS tablet [id number] Yes Take 1,000 Units by mouth daily. [provider]  Active Self, Pharmacy Records           Med Note Lynn Mccoy Oct 31, 2021  3:43 PM)    ferrous sulfate  325 (65 FE) MG EC tablet [id number] Yes Take 325 mg by mouth every other day. [provider]  Active Self,  Pharmacy Records  metoprolol tartrate (LOPRESSOR) 50 MG tablet [id number] Yes Take 1 tablet (50 mg total) by mouth 2 (two) times daily. Von Bellis, MD  Active   JESSE SCHLOSSMAN [id number] Yes Take 1 tablet by mouth daily. Neuriva OTC [provider]  Active Self, Pharmacy Records  NONFORMULARY OR COMPOUNDED ITEM [id number] Yes Take 4 tablets by mouth daily. SeroVital- hgh [provider]  Active Self, Pharmacy Records  sertraline  (ZOLOFT ) 100 MG tablet [id number] Yes TAKE ONE TABLET EVERY DAY Gasper Nancyann BRAVO, MD  Active Self, Pharmacy Records           Med Note Lynn Mccoy   Mon Apr 21, 2024  7:02 PM) Taking three times per week  traZODone  (DESYREL ) 100 MG tablet [id number] Yes TAKE ONE-HALF TO ONE TABLET ONE HOUR BEFORE BEDTIME. Gasper Nancyann BRAVO, MD  Active Self, Pharmacy Records            Home Care and Equipment/Supplies: Were Home Health Services Ordered?: NA Any new equipment or medical supplies ordered?: NA  Functional Questionnaire: Do you need assistance with bathing/showering or dressing?: No Do you need assistance with meal preparation?: No Do you need assistance with eating?: No Do you have difficulty maintaining continence: No Do you need assistance with getting out of bed/getting out of a chair/moving?: No Do you have difficulty managing or taking your medications?: No  Follow up appointments reviewed: PCP Follow-up appointment confirmed?: No (will back to schedule) MD Provider Line Number:[phone] Given: No Specialist Hospital Follow-up appointment confirmed?: No Reason Specialist Follow-Up Not Confirmed: Patient has Specialist Provider Number  and will Call for Appointment Do you need transportation to your follow-up appointment?: No Do you understand care options if your condition(s) worsen?: Yes-patient verbalized understanding    SIGNATURE Julian Lemmings, LPN [facility] Nurse Health Advisor Direct Dial [phone]

## 2024-05-07 MED ORDER — SODIUM CHLORIDE 0.9 % IV SOLN: INTRAVENOUS | Status: DC

## 2024-05-08 ENCOUNTER — Other Ambulatory Visit: Payer: Self-pay

## 2024-05-08 ENCOUNTER — Ambulatory Visit
Admission: RE | Admit: 2024-05-08 | Discharge: 2024-05-08 | Disposition: A | Discharge status: Home / Self Care | Source: Home / Self Care | Attending: Student | Admitting: Student

## 2024-05-08 ENCOUNTER — Encounter: Admission: RE | Disposition: A | Payer: Self-pay | Source: Home / Self Care | Attending: Cardiology

## 2024-05-08 ENCOUNTER — Ambulatory Visit: Admitting: Anesthesiology

## 2024-05-08 ENCOUNTER — Ambulatory Visit

## 2024-05-08 ENCOUNTER — Encounter: Payer: Self-pay | Admitting: Cardiology

## 2024-05-08 ENCOUNTER — Telehealth (HOSPITAL_BASED_OUTPATIENT_CLINIC_OR_DEPARTMENT_OTHER): Payer: Self-pay | Admitting: *Deleted

## 2024-05-08 ENCOUNTER — Observation Stay: Admission: RE | Admit: 2024-05-08 | Discharge: 2024-05-09 | Disposition: A | Discharge status: Home / Self Care

## 2024-05-08 DIAGNOSIS — I10 Essential (primary) hypertension: Secondary | ICD-10-CM | POA: Diagnosis present

## 2024-05-08 DIAGNOSIS — I5030 Unspecified diastolic (congestive) heart failure: Secondary | ICD-10-CM | POA: Insufficient documentation

## 2024-05-08 DIAGNOSIS — T17908A Unspecified foreign body in respiratory tract, part unspecified causing other injury, initial encounter: Secondary | ICD-10-CM | POA: Diagnosis present

## 2024-05-08 DIAGNOSIS — I110 Hypertensive heart disease with heart failure: Secondary | ICD-10-CM | POA: Insufficient documentation

## 2024-05-08 DIAGNOSIS — Z7901 Long term (current) use of anticoagulants: Secondary | ICD-10-CM | POA: Diagnosis not present

## 2024-05-08 DIAGNOSIS — I480 Paroxysmal atrial fibrillation: Secondary | ICD-10-CM | POA: Diagnosis not present

## 2024-05-08 DIAGNOSIS — R0902 Hypoxemia: Secondary | ICD-10-CM | POA: Diagnosis not present

## 2024-05-08 DIAGNOSIS — E785 Hyperlipidemia, unspecified: Secondary | ICD-10-CM | POA: Diagnosis not present

## 2024-05-08 DIAGNOSIS — Z8543 Personal history of malignant neoplasm of ovary: Secondary | ICD-10-CM | POA: Diagnosis not present

## 2024-05-08 DIAGNOSIS — I4891 Unspecified atrial fibrillation: Principal | ICD-10-CM | POA: Diagnosis present

## 2024-05-08 HISTORY — PX: CARDIOVERSION: SHX1299

## 2024-05-08 HISTORY — PX: TEE WITHOUT CARDIOVERSION: SHX5443

## 2024-05-08 LAB — COMPREHENSIVE METABOLIC PANEL WITH GFR
ALT: 74 U/L — ABNORMAL HIGH (ref 0–44)
AST: 77 U/L — ABNORMAL HIGH (ref 15–41)
Albumin: 3.4 g/dL — ABNORMAL LOW (ref 3.5–5.0)
Alkaline Phosphatase: 43 U/L (ref 38–126)
Anion gap: 11 (ref 5–15)
BUN: 13 mg/dL (ref 8–23)
CO2: 22 mmol/L (ref 22–32)
Calcium: 8.2 mg/dL — ABNORMAL LOW (ref 8.9–10.3)
Chloride: 108 mmol/L (ref 98–111)
Creatinine, Ser: 0.67 mg/dL (ref 0.44–1.00)
GFR, Estimated: >60 mL/min (ref >60)
Glucose, Bld: 105 mg/dL — ABNORMAL HIGH (ref 70–99)
Potassium: 3.8 mmol/L (ref 3.5–5.1)
Sodium: 141 mmol/L (ref 135–145)
Total Bilirubin: 0.7 mg/dL (ref 0.0–1.2)
Total Protein: 6.7 g/dL (ref 6.5–8.1)

## 2024-05-08 LAB — CBC
HCT: 32.9 % — ABNORMAL LOW (ref 36.0–46.0)
Hemoglobin: 10.8 g/dL — ABNORMAL LOW (ref 12.0–15.0)
MCH: 30.9 pg (ref 26.0–34.0)
MCHC: 32.8 g/dL (ref 30.0–36.0)
MCV: 94 fL (ref 80.0–100.0)
Platelets: 208 K/uL (ref 150–400)
RBC: 3.5 MIL/uL — ABNORMAL LOW (ref 3.87–5.11)
RDW: 13.2 % (ref 11.5–15.5)
WBC: 7.1 K/uL (ref 4.0–10.5)
nRBC: 0 % (ref 0.0–0.2)

## 2024-05-08 LAB — MAGNESIUM: Magnesium: 2.3 mg/dL (ref 1.7–2.4)

## 2024-05-08 LAB — PHOSPHORUS: Phosphorus: 2.4 mg/dL — ABNORMAL LOW (ref 2.5–4.6)

## 2024-05-08 SURGERY — ECHOCARDIOGRAM, TRANSESOPHAGEAL
Anesthesia: General

## 2024-05-08 MED ORDER — METOPROLOL SUCCINATE ER 25 MG PO TB24: 50.0000 mg | ORAL_TABLET | Freq: Two times a day (BID) | ORAL | Status: DC

## 2024-05-08 MED ORDER — K PHOS MONO-SOD PHOS DI & MONO 155-852-130 MG PO TABS
500.0000 mg | ORAL_TABLET | Freq: Once | ORAL | Status: AC
  Administered 2024-05-08: 500 mg via ORAL
  Filled 2024-05-08: qty 2

## 2024-05-08 MED ORDER — HYDROCODONE-ACETAMINOPHEN 5-325 MG PO TABS: 1.0000 | ORAL_TABLET | ORAL | Status: DC | PRN

## 2024-05-08 MED ORDER — DEXMEDETOMIDINE HCL IN NACL 80 MCG/20ML IV SOLN: INTRAVENOUS | Status: DC | PRN

## 2024-05-08 MED ORDER — LABETALOL HCL 5 MG/ML IV SOLN
INTRAVENOUS | Status: AC
  Filled 2024-05-08: qty 4

## 2024-05-08 MED ORDER — ACETAMINOPHEN 650 MG RE SUPP: 650.0000 mg | Freq: Four times a day (QID) | RECTAL | Status: DC | PRN

## 2024-05-08 MED ORDER — ONDANSETRON HCL 4 MG/2ML IJ SOLN
INTRAMUSCULAR | Status: AC
  Filled 2024-05-08: qty 2

## 2024-05-08 MED ORDER — APIXABAN 5 MG PO TABS
5.0000 mg | ORAL_TABLET | Freq: Two times a day (BID) | ORAL | Status: DC
  Administered 2024-05-08 – 2024-05-09 (×2): 5 mg via ORAL
  Filled 2024-05-08 (×2): qty 1

## 2024-05-08 MED ORDER — PROPOFOL 10 MG/ML IV BOLUS
INTRAVENOUS | Status: DC | PRN
  Administered 2024-05-08 (×2): 10 mg via INTRAVENOUS
  Administered 2024-05-08: 20 mg via INTRAVENOUS
  Administered 2024-05-08: 40 mg via INTRAVENOUS

## 2024-05-08 MED ORDER — METOPROLOL SUCCINATE ER 25 MG PO TB24
50.0000 mg | ORAL_TABLET | Freq: Two times a day (BID) | ORAL | Status: DC
  Administered 2024-05-08 – 2024-05-09 (×2): 50 mg via ORAL
  Filled 2024-05-08 (×2): qty 2

## 2024-05-08 MED ORDER — EPHEDRINE 5 MG/ML INJ
INTRAVENOUS | Status: AC
  Filled 2024-05-08: qty 5

## 2024-05-08 MED ORDER — ONDANSETRON HCL 4 MG/2ML IJ SOLN: 4.0000 mg | Freq: Four times a day (QID) | INTRAMUSCULAR | Status: DC | PRN

## 2024-05-08 MED ORDER — LABETALOL HCL 5 MG/ML IV SOLN
INTRAVENOUS | Status: DC | PRN
Start: 2024-05-08 — End: 2024-05-08
  Administered 2024-05-08: 5 mg via INTRAVENOUS

## 2024-05-08 MED ORDER — DEXMEDETOMIDINE HCL IN NACL 80 MCG/20ML IV SOLN
INTRAVENOUS | Status: AC
  Filled 2024-05-08: qty 20

## 2024-05-08 MED ORDER — ACETAMINOPHEN 325 MG PO TABS
650.0000 mg | ORAL_TABLET | Freq: Four times a day (QID) | ORAL | Status: DC | PRN
  Administered 2024-05-08: 650 mg via ORAL
  Filled 2024-05-08: qty 2

## 2024-05-08 MED ORDER — ALBUTEROL SULFATE (2.5 MG/3ML) 0.083% IN NEBU
2.5000 mg | INHALATION_SOLUTION | Freq: Once | RESPIRATORY_TRACT | Status: AC
  Administered 2024-05-08: 2.5 mg via RESPIRATORY_TRACT

## 2024-05-08 MED ORDER — ESMOLOL HCL 100 MG/10ML IV SOLN
INTRAVENOUS | Status: AC
  Filled 2024-05-08: qty 10

## 2024-05-08 MED ORDER — IPRATROPIUM-ALBUTEROL 0.5-2.5 (3) MG/3ML IN SOLN
RESPIRATORY_TRACT | Status: AC
  Filled 2024-05-08: qty 3

## 2024-05-08 NOTE — Anesthesia Preprocedure Evaluation (Signed)
 Anesthesia Evaluation  Patient identified by MRN, date of birth, ID band Patient awake    Reviewed: Allergy & Precautions, H&P , NPO status , Patient's Chart, lab work & pertinent test results, reviewed documented beta blocker date and time   Airway Mallampati: II   Neck ROM: full    Dental  (+) Poor Dentition   Pulmonary neg pulmonary ROS   Pulmonary exam normal        Cardiovascular Exercise Tolerance: Poor hypertension, On Medications +CHF  (-) Orthopnea, (-) PND and (-) DOE Atrial Fibrillation + Valvular Problems/Murmurs  Rhythm:regular Rate:Normal     Neuro/Psych  PSYCHIATRIC DISORDERS  Depression    negative neurological ROS     GI/Hepatic negative GI ROS, Neg liver ROS,,,  Endo/Other  negative endocrine ROS    Renal/GU negative Renal ROS  negative genitourinary   Musculoskeletal   Abdominal   Peds  Hematology  (+) Blood dyscrasia, anemia   Anesthesia Other Findings Past Medical History: No date: Anemia No date: Cancer (HCC)     Comment:  ovarian ca No date: Depression 12/29/2014: History of cervical cancer     Comment:  stage 4, s/p complete hysterectomy  No date: History of mumps as a child No date: Hyperlipidemia No date: Hypertension No date: Osteoarthrosis     Comment:  knees No date: Osteopenia No date: Wears dentures     Comment:  full upper and lower Past Surgical History: 1977: ABDOMINAL HYSTERECTOMY     Comment:  due to cervical cancer stage 4 1977: APPENDECTOMY No date: arthritis; Bilateral 03/06/2018: CATARACT EXTRACTION W/PHACO; Left     Comment:  Procedure: CATARACT EXTRACTION PHACO AND INTRAOCULAR               LENS PLACEMENT (IOC) COMPLICATED LEFT;  Surgeon:               Mittie Gaskin, MD;  Location: [facility];              Service: Ophthalmology;  Laterality: Left;  HEALON 5               VISION BLUE CAPSULAR TENSION RING MALYUGIN No date:  COLONOSCOPY 11/14/2021: KNEE ARTHROPLASTY; Left     Comment:  Procedure: COMPUTER ASSISTED TOTAL KNEE ARTHROPLASTY;                Surgeon: Mardee Lynwood SQUIBB, MD;  Location: [facility];                Service: Orthopedics;  Laterality: Left; No date: OOPHORECTOMY No date: REPLACEMENT TOTAL KNEE; Right about 2006: ROTATOR CUFF REPAIR; Right     Comment:  Dr. Cleotilde   Reproductive/Obstetrics negative OB ROS                              Anesthesia Physical Anesthesia Plan  ASA: 4  Anesthesia Plan: General   Post-op Pain Management:    Induction:   PONV Risk Score and Plan:   Airway Management Planned:   Additional Equipment:   Intra-op Plan:   Post-operative Plan:   Informed Consent: I have reviewed the patients History and Physical, chart, labs and discussed the procedure including the risks, benefits and alternatives for the proposed anesthesia with the patient or authorized representative who has indicated his/her understanding and acceptance.     Dental Advisory Given  Plan Discussed with: CRNA  Anesthesia Plan Comments:  Anesthesia Quick Evaluation

## 2024-05-08 NOTE — Transfer of Care (Signed)
 Immediate Anesthesia Transfer of Care Note  Patient: Lynn Mccoy  Procedure(s) Performed: ECHOCARDIOGRAM, TRANSESOPHAGEAL CARDIOVERSION  Patient Location: Short Stay  Anesthesia Type:General  Level of Consciousness: awake, alert , and oriented  Airway & Oxygen Therapy: Patient Spontanous Breathing and Patient connected to face mask oxygen  Post-op Assessment: Report given to RN and Post -op Vital signs reviewed and stable  Post vital signs: Reviewed and stable  Last Vitals:  Vitals Value Taken Time  BP    Temp    Pulse    Resp    SpO2      Last Pain:  Vitals:   05/08/24 1223  TempSrc: Temporal  PainSc: 0-No pain         Complications: No notable events documented.

## 2024-05-08 NOTE — Progress Notes (Signed)
*  PRELIMINARY RESULTS* Echocardiogram Echocardiogram Transesophageal has been performed.  Lynn Mccoy 05/08/2024, 1:34 PM

## 2024-05-08 NOTE — Consult Note (Signed)
 [facility] CLINIC CARDIOLOGY CONSULT NOTE       Patient ID: Lynn Mccoy MRN: [id number] DOB/AGE: January 04, 1941 83 y.o.  Admit date: 05/08/2024 Referring Physician None - outpatient TEE being admitted Primary Physician Gasper Nancyann BRAVO, MD  Primary Cardiologist Dr. Wilburn Reason for Consultation Post-TEE vomiting/concern for aspiration pneumonia  HPI: Lynn Mccoy is a 83 y.o. female  with a past medical history of dCHF, HTN, HLD, depression, adjustment disorder, iron deficiency anemia, arthritis, cervical cancer and ovarian cancer (s/p of hysterectomy) who presented today, 05/08/2024 for outpatient TEE/Cardioversion.   Patient was in her usual state of health and presented today for outpatient TEE/cardioversion after recent diagnosis of atrial fibrillation. Following completion of TEE patient had an episode of vomiting and subsequent desaturation to the 60s. Quickly recovered to the 80s and sustained for roughly 10 minutes before improving to 90s. Stat CXR obtained without obvious evidence of aspiration but still high clinical suspicion. Cardioversion was cancelled due to concern for aspiration. Anesthesia was present and recommended overnight observation. She is without CP, SOB, palpitation symptoms. HR 90-100s on tele.   Review of systems complete and found to be negative unless listed above    Past Medical History:  Diagnosis Date   Anemia    Cancer (HCC)    ovarian ca   Depression    History of cervical cancer 12/29/2014   stage 4, s/p complete hysterectomy    History of mumps as a child    Hyperlipidemia    Hypertension    Osteoarthrosis    knees   Osteopenia    Wears dentures    full upper and lower    Past Surgical History:  Procedure Laterality Date   ABDOMINAL HYSTERECTOMY  1977   due to cervical cancer stage 4   APPENDECTOMY  1977   arthritis Bilateral    CATARACT EXTRACTION W/PHACO Left 03/06/2018   Procedure: CATARACT EXTRACTION PHACO AND INTRAOCULAR LENS  PLACEMENT (IOC) COMPLICATED LEFT;  Surgeon: Mittie Gaskin, MD;  Location: [facility];  Service: Ophthalmology;  Laterality: Left;  HEALON 5 VISION BLUE CAPSULAR TENSION RING MALYUGIN   COLONOSCOPY     KNEE ARTHROPLASTY Left 11/14/2021   Procedure: COMPUTER ASSISTED TOTAL KNEE ARTHROPLASTY;  Surgeon: Mardee Lynwood SQUIBB, MD;  Location: [facility] ORS;  Service: Orthopedics;  Laterality: Left;   OOPHORECTOMY     REPLACEMENT TOTAL KNEE Right    ROTATOR CUFF REPAIR Right about 2006   Dr. Cleotilde    Medications Prior to Admission  Medication Sig Dispense Refill Last Dose/Taking   apixaban (ELIQUIS) 5 MG TABS tablet Take 1 tablet (5 mg total) by mouth 2 (two) times daily. 60 tablet 11 05/08/2024   cetirizine (ZYRTEC) 10 MG tablet Take 10 mg by mouth daily.   05/07/2024   cholecalciferol  (VITAMIN D ) 1000 UNITS tablet Take 1,000 Units by mouth daily.   05/07/2024   ferrous sulfate  325 (65 FE) MG EC tablet Take 325 mg by mouth every other day.   05/07/2024   sertraline  (ZOLOFT ) 100 MG tablet TAKE ONE TABLET EVERY DAY 90 tablet 3 05/07/2024   traZODone  (DESYREL ) 100 MG tablet TAKE ONE-HALF TO ONE TABLET ONE HOUR BEFORE BEDTIME. 90 tablet 3 05/07/2024   alendronate  (FOSAMAX ) 70 MG tablet Take 1 tablet (70 mg total) by mouth every 7 (seven) days. Take with a full glass of water on an empty stomach. 4 tablet 11    NON FORMULARY Take 1 tablet by mouth daily. Neuriva OTC      NONFORMULARY  OR COMPOUNDED ITEM Take 4 tablets by mouth daily. SeroVital- hgh      Social History   Socioeconomic History   Marital status: Widowed    Spouse name: Not on file   Number of children: 3   Years of education: 68   Highest education level: 12th grade  Occupational History   Occupation: Retired    Comment: former scientist, research (medical)  Tobacco Use   Smoking status: Never   Smokeless tobacco: Never  Vaping Use   Vaping status: Never Used  Substance and Sexual Activity   Alcohol use: Yes    Alcohol/week: 0.0 - 1.0 standard  drinks of alcohol   Drug use: No   Sexual activity: Not Currently  Other Topics Concern   Not on file  Social History Narrative   Lives alone   Social Drivers of Health   Financial Resource Strain: Low Risk  (12/18/2023)   Overall Financial Resource Strain (CARDIA)    Difficulty of Paying Living Expenses: Not hard at all  Food Insecurity: No Food Insecurity (12/18/2023)   Hunger Vital Sign    Worried About Running Out of Food in the Last Year: Never true    Ran Out of Food in the Last Year: Never true  Transportation Needs: No Transportation Needs (12/18/2023)   PRAPARE - Administrator, Civil Service (Medical): No    Lack of Transportation (Non-Medical): No  Physical Activity: Insufficiently Active (12/18/2023)   Exercise Vital Sign    Days of Exercise per Week: 2 days    Minutes of Exercise per Session: 40 min  Stress: No Stress Concern Present (12/18/2023)   [employer] of Occupational Health - Occupational Stress Questionnaire    Feeling of Stress: Only a little  Social Connections: Moderately Isolated (12/18/2023)   Social Connection and Isolation Panel    Frequency of Communication with Friends and Family: More than three times a week    Frequency of Social Gatherings with Friends and Family: More than three times a week    Attends Religious Services: 1 to 4 times per year    Active Member of [employer] or Organizations: No    Attends Banker Meetings: Not on file    Marital Status: Widowed  Intimate Partner Violence: Not At Risk (03/07/2023)   Humiliation, Afraid, Rape, and Kick questionnaire    Fear of Current or Ex-Partner: No    Emotionally Abused: No    Physically Abused: No    Sexually Abused: No    Family History  Problem Relation Age of Onset   Leukemia Mother    Lung cancer Father    Dementia Sister    Alzheimer's disease Sister    Heart attack Brother    Rheum arthritis Sister    COPD Sister    Breast cancer Neg Hx      Vitals:    05/08/24 1223 05/08/24 1410 05/08/24 1415  BP: (!) 139/102 112/82 (!) 86/58  Pulse: 92 85 94  Resp: 20 16 16   Temp: 97.8 F (36.6 C) (!) 97.3 F (36.3 C)   TempSrc: Temporal Temporal   SpO2: 94% 93% 92%  Weight: 74.4 kg    Height: 5' 7 (1.702 m)      PHYSICAL EXAM General: Chronically ill appearing elderly female, well nourished, in no acute distress. HEENT: Normocephalic and atraumatic. Neck: No JVD.  Lungs: Normal respiratory effort on 6L [place].  Heart: Irregularly irregular, borderline rate. Normal S1 and S2 without gallops or murmurs.  Abdomen: Non-distended  appearing.  Msk: Normal strength and tone for age. Extremities: Warm and well perfused. No clubbing, cyanosis. No edema.  Neuro: Alert and oriented X 3. Psych: Answers questions appropriately.   Labs: Basic Metabolic Panel: No results for input(s): NA, K, CL, CO2, GLUCOSE, BUN, CREATININE, CALCIUM, MG, PHOS in the last 72 hours. Liver Function Tests: No results for input(s): AST, ALT, ALKPHOS, BILITOT, PROT, ALBUMIN in the last 72 hours. No results for input(s): LIPASE, AMYLASE in the last 72 hours. CBC: No results for input(s): WBC, NEUTROABS, HGB, HCT, MCV, PLT in the last 72 hours. Cardiac Enzymes: No results for input(s): CKTOTAL, CKMB, CKMBINDEX, TROPONINIHS in the last 72 hours. BNP: No results for input(s): BNP in the last 72 hours. D-Dimer: No results for input(s): DDIMER in the last 72 hours. Hemoglobin A1C: No results for input(s): HGBA1C in the last 72 hours. Fasting Lipid Panel: No results for input(s): CHOL, HDL, LDLCALC, TRIG, CHOLHDL, LDLDIRECT in the last 72 hours. Thyroid  Function Tests: No results for input(s): TSH, T4TOTAL, T3FREE, THYROIDAB in the last 72 hours.  Invalid input(s): FREET3 Anemia Panel: No results for input(s): VITAMINB12, FOLATE, FERRITIN, TIBC, IRON, RETICCTPCT in the last 72  hours.   Radiology: [facility] Chest Port 1 View Result Date: 05/08/2024 EXAM: 1 VIEW(S) XRAY OF THE CHEST 05/08/2024 01:27:00 PM COMPARISON: 04/21/2024 CLINICAL HISTORY: Vomiting in adult FINDINGS: LUNGS AND PLEURA: Elevated right hemidiaphragm. Bibasilar patchy opacities. No pulmonary edema. Blunting of bilateral costophrenic angles. No pneumothorax. HEART AND MEDIASTINUM: No acute abnormality of the cardiac and mediastinal silhouettes. BONES AND SOFT TISSUES: No acute osseous abnormality. IMPRESSION: 1. Bibasilar patchy opacities, which may reflect atelectasis, aspiration for pneumonia. 2. Blunting of bilateral costophrenic angles, suggests small effusions. Electronically signed by: Waddell Calk MD 05/08/2024 02:05 PM EST RP Workstation: [id number]   CT Angio Chest Pulmonary Embolism (PE) W or WO Contrast Result Date: 04/21/2024 CLINICAL DATA:  Chest pain and shortness of breath since yesterday, atrial fibrillation EXAM: CT ANGIOGRAPHY CHEST WITH CONTRAST TECHNIQUE: Multidetector CT imaging of the chest was performed using the standard protocol during bolus administration of intravenous contrast. Multiplanar CT image reconstructions and MIPs were obtained to evaluate the vascular anatomy. RADIATION DOSE REDUCTION: This exam was performed according to the departmental dose-optimization program which includes automated exposure control, adjustment of the mA and/or kV according to patient size and/or use of iterative reconstruction technique. CONTRAST:  75mL OMNIPAQUE IOHEXOL 350 MG/ML SOLN COMPARISON:  04/21/2024 FINDINGS: Cardiovascular: This is a technically adequate evaluation of the pulmonary vasculature. No filling defects or pulmonary emboli. Mild cardiomegaly without pericardial effusion. No evidence of thoracic aortic aneurysm or dissection. Atherosclerosis of the aorta and coronary vasculature. Mediastinum/Nodes: No enlarged mediastinal, hilar, or axillary lymph nodes. Thyroid  gland, trachea, and esophagus  demonstrate no significant findings. Small hiatal hernia. Lungs/Pleura: Trace bilateral pleural effusions, right greater than left. No airspace disease or pneumothorax. The central airways are patent. Upper Abdomen: No acute abnormality. Musculoskeletal: No acute or destructive bony abnormalities. Bilateral shoulder osteoarthritis, right greater than left. Reconstructed images demonstrate no additional findings. Review of the MIP images confirms the above findings. IMPRESSION: 1. No evidence of pulmonary embolus. 2. Trace bilateral pleural effusions. 3. Cardiomegaly without pericardial effusion. 4. Aortic Atherosclerosis (ICD10-I70.0). Coronary artery atherosclerosis. Electronically Signed   By: Ozell Daring M.D.   On: 04/21/2024 17:37   DG Chest 2 View Result Date: 04/21/2024 EXAM: 2 VIEW(S) XRAY OF THE CHEST 04/21/2024 03:31:00 PM COMPARISON: None available. CLINICAL HISTORY: chest pain. Chest pain chest pain. Chest  pain FINDINGS: LUNGS AND PLEURA: Blunting of bilateral costophrenic angles. No focal pulmonary opacity. No pulmonary edema. No pneumothorax. HEART AND MEDIASTINUM: No acute abnormality of the cardiac and mediastinal silhouettes. BONES AND SOFT TISSUES: Suture anchor identified in the right humeral head. Multilevel endplate degenerative changes and disc space narrowing within the thoracic spine. IMPRESSION: 1. Blunting of bilateral costophrenic angles which may reflect trace bilateral pleural effusions. Electronically signed by: Waddell Calk MD 04/21/2024 03:59 PM EDT RP Workstation: [id number]    ECHO 05/05/2024: 1. Left ventricular ejection fraction, by estimation, is 60 to 65%. The  left ventricle has normal function. The left ventricle has no regional  wall motion abnormalities. Left ventricular diastolic parameters are  consistent with Grade I diastolic  dysfunction (impaired relaxation).   2. Right ventricular systolic function is normal. The right ventricular  size is normal.    3. The mitral valve is normal in structure. No evidence of mitral valve  regurgitation.   4. The aortic valve is normal in structure. Aortic valve regurgitation is  mild.   TEE today with EF reduced  TELEMETRY (personally reviewed): atrial fibrillation rate 90-100s  EKG (personally reviewed): atrial fibrillation rate 99 bpm  Data reviewed by me 05/08/2024: last 24h vitals tele labs imaging I/O ED provider note, admission H&P  Principal Problem:   Hypoxia Active Problems:   Hypertension   Atrial fibrillation (HCC)   Aspiration into airway    ASSESSMENT AND PLAN:  Lynn Mccoy is a 83 y.o. female  with a past medical history of dCHF, HTN, HLD, depression, adjustment disorder, iron deficiency anemia, arthritis, cervical cancer and ovarian cancer (s/p of hysterectomy) who presented today, 05/08/2024 for outpatient TEE/Cardioversion.   # Concern for aspiration # Episode of vomiting # New onset atrial fibrillation # Newly reduced ejection fraction, hx HFpEF Patient presented today for outpatient TEE/cardioversion.  Following completion of TEE patient had an episode of vomiting with subsequent desaturation down to the 60s.  Suctioning and jaw thrust was performed and increased supplemental O2, quickly recovered to the 80s but sustained in the 80s for roughly 10 minutes before improving to the 90s on oxygen.  Chest x-ray with bibasilar patchy opacities, which may reflect atelectasis, aspiration for pneumonia. -S/p duoneb treatment.  -Will switch metoprolol to succinate 50 mg BID. If HR sustains > 90 and BP will tolerate then will increase to 75 mg BID.  -If able to increase metoprolol and BP will tolerate additional GDMT, would add losartan.  -Continue eliquis 5 mg bid.  -Anesthesia recommended overnight monitoring of SpO2 given concern for aspiration.   This patient's plan of care was discussed and created with Dr. Florencio and he is in agreement.  Signed: Danita Bloch, PA-C   05/08/2024, 2:30 PM [facility]

## 2024-05-08 NOTE — Progress Notes (Signed)
*  PRELIMINARY RESULTS* Echocardiogram Echocardiogram Transesophageal has been performed.  Floydene Harder 05/08/2024, 1:21 PM

## 2024-05-08 NOTE — H&P (Addendum)
 [facility] History and Physical  Lynn Mccoy FMW:[id number] DOB: 09-18-1940 DOA: 05/08/2024  Referring physician: ED  PCP: Gasper Nancyann BRAVO, MD   Patient is coming from: Home  Chief Complaint:  hypoxia after TEE  HPI:  Patient is 83 years old female with past medical history of atrial fibrillation, congestive heart failure, anemia, depression, hyperlipidemia, hypertension presented to endoscopy unit for TEE and cardioversion for her atrial fibrillation.  After TEE was done patient had a vomiting episode and possible aspiration.  Her pulse ox was in the 70s so anesthesia team suggested overnight observation in the hospital.  Complains of mild cough on facemask.  Of note patient was recently discharged 04/22/2024 with plans for outpatient cardioversion.  Patient was admitted at that time with new onset atrial fibrillation with RVR acute on chronic diastolic heart failure and atypical chest pain.  Patient seen at recovery unit.  No labs or any imaging available today.  Patient denies any nausea, vomiting, abdominal pain.  Has had bowel movement every day.  Denies any urinary urgency, frequency or dysuria.  Patient was relatively well at home prior to this procedure.  Denies any chest pain, fevers or chills.  Patient seen at the recovery unit.  Vitals at the recovery unit with blood pressure elevated at 139/102.   Assessment and Plan Principal Problem:   Hypoxia Active Problems:   Atrial fibrillation (HCC)   Hypertension   Aspiration into airway  Hypoxia likely secondary to aspiration from vomiting.  Pulse ox was in the 70% and plan is observation overnight.  Continue supplemental oxygen, aspiration precautions.  Will observe off antibiotics.  Wean oxygen as able.  Paroxysmal atrial fibrillation.  Cardioversion was not attempted due to vomiting episode and aspiration.  Currently rate controlled.  Cardiology to follow.  Patient is on Eliquis for anticoagulation as outpatient.  Continue  Eliquis  History of congestive diastolic heart failure.  Recent 2D echocardiogram from 05/05/2024 showed LV ejection fraction of 60 to 65% with grade 1 diastolic dysfunction.  Continue metoprolol.  Volume status appears to be okay at this time  Hyperlipidemia Not on statins.  Essential hypertension Continue to monitor.  On metoprolol 50 mg twice daily.  Latest blood pressure of 112/82  Addendum:  05/08/2024 6:06 PM   Labs reviewed.  Hemoglobin of 10.8 with creatinine 0.6.  Potassium 3.8 with magnesium  2.3 phosphorus was noted to be low at 2.4.  Will replenish with K-Phos today.  DVT Prophylaxis: Eliquis  Review of Systems:  All systems were reviewed and were negative unless otherwise mentioned in the HPI   Past Medical History:  Diagnosis Date   Anemia    Cancer (HCC)    ovarian ca   Depression    History of cervical cancer 12/29/2014   stage 4, s/p complete hysterectomy    History of mumps as a child    Hyperlipidemia    Hypertension    Osteoarthrosis    knees   Osteopenia    Wears dentures    full upper and lower   Past Surgical History:  Procedure Laterality Date   ABDOMINAL HYSTERECTOMY  1977   due to cervical cancer stage 4   APPENDECTOMY  1977   arthritis Bilateral    CATARACT EXTRACTION W/PHACO Left 03/06/2018   Procedure: CATARACT EXTRACTION PHACO AND INTRAOCULAR LENS PLACEMENT (IOC) COMPLICATED LEFT;  Surgeon: Mittie Gaskin, MD;  Location: [facility];  Service: Ophthalmology;  Laterality: Left;  HEALON 5 VISION BLUE CAPSULAR TENSION RING MALYUGIN  COLONOSCOPY     KNEE ARTHROPLASTY Left 11/14/2021   Procedure: COMPUTER ASSISTED TOTAL KNEE ARTHROPLASTY;  Surgeon: Mardee Lynwood SQUIBB, MD;  Location: [facility] ORS;  Service: Orthopedics;  Laterality: Left;   OOPHORECTOMY     REPLACEMENT TOTAL KNEE Right    ROTATOR CUFF REPAIR Right about 2006   Dr. Cleotilde    Social History:  reports that she has never smoked. She has never used smokeless tobacco. She  reports current alcohol use. She reports that she does not use drugs.  No Known Allergies  Family History  Problem Relation Age of Onset   Leukemia Mother    Lung cancer Father    Dementia Sister    Alzheimer's disease Sister    Heart attack Brother    Rheum arthritis Sister    COPD Sister    Breast cancer Neg Hx      Prior to Admission medications   Medication Sig Start Date End Date Taking? Authorizing Provider  apixaban (ELIQUIS) 5 MG TABS tablet Take 1 tablet (5 mg total) by mouth 2 (two) times daily. 04/22/24 04/22/25 Yes Von Bellis, MD  cetirizine (ZYRTEC) 10 MG tablet Take 10 mg by mouth daily.   Yes [provider]  cholecalciferol  (VITAMIN D ) 1000 UNITS tablet Take 1,000 Units by mouth daily.   Yes [provider]  ferrous sulfate  325 (65 FE) MG EC tablet Take 325 mg by mouth every other day.   Yes [provider]  sertraline  (ZOLOFT ) 100 MG tablet TAKE ONE TABLET EVERY DAY 07/03/23  Yes Gasper Nancyann BRAVO, MD  traZODone  (DESYREL ) 100 MG tablet TAKE ONE-HALF TO ONE TABLET ONE HOUR BEFORE BEDTIME. 07/03/23  Yes Gasper Nancyann BRAVO, MD  alendronate  (FOSAMAX ) 70 MG tablet Take 1 tablet (70 mg total) by mouth every 7 (seven) days. Take with a full glass of water on an empty stomach. 07/31/23   Gasper Nancyann BRAVO, MD  NON FORMULARY Take 1 tablet by mouth daily. Neuriva OTC    [provider]  NONFORMULARY OR COMPOUNDED ITEM Take 4 tablets by mouth daily. SeroVital- hgh    [provider]    Physical Exam:  Vitals:   05/08/24 1223 05/08/24 1410  BP: (!) 139/102 112/82  Pulse: 92 85  Resp: 20 16  Temp: 97.8 F (36.6 C) (!) 97.3 F (36.3 C)  TempSrc: Temporal Temporal  SpO2: 94% 93%  Weight: 74.4 kg   Height: 5' 7 (1.702 m)    Wt Readings from Last 3 Encounters:  05/08/24 74.4 kg  04/21/24 76.2 kg  05/09/23 75.8 kg   Body mass index is 25.69 kg/m.  General:  Average built, not in obvious distress, elderly female,  Communicative, on facemask, HENT: Normocephalic, No scleral pallor or icterus noted. Oral mucosa is moist.  Chest: Coarse breath sounds noted CVS: S1 &S2 heard. No murmur.  Irregular rhythm but controlled Abdomen: Soft, nontender, nondistended.  Bowel sounds are heard. No abdominal mass palpated Extremities: No cyanosis, clubbing or edema.  Peripheral pulses are palpable. Psych: Alert, awake and oriented, normal mood CNS:  No cranial nerve deficits.  Power equal in all extremities.   Skin: Warm and dry.  No rashes noted.  Labs on Admission:   CBC: No results for input(s): WBC, NEUTROABS, HGB, HCT, MCV, PLT in the last 168 hours.  Basic Metabolic Panel: No results for input(s): NA, K, CL, CO2, GLUCOSE, BUN, CREATININE, CALCIUM, MG, PHOS in the last 168 hours.  Liver Function Tests: No results for input(s): AST,  ALT, ALKPHOS, BILITOT, PROT, ALBUMIN in the last 168 hours. No results for input(s): LIPASE, AMYLASE in the last 168 hours. No results for input(s): AMMONIA in the last 168 hours.  Cardiac Enzymes: No results for input(s): CKTOTAL, CKMB, CKMBINDEX, TROPONINI in the last 168 hours.  BNP (last 3 results) Recent Labs    04/21/24 1500  BNP 362.7*    ProBNP (last 3 results) No results for input(s): PROBNP in the last 8760 hours.  CBG: No results for input(s): GLUCAP in the last 168 hours.  Lipase  No results found for: LIPASE   Urinalysis    Component Value Date/Time   COLORURINE YELLOW (A) 11/03/2021 1406   APPEARANCEUR HAZY (A) 11/03/2021 1406   LABSPEC 1.028 11/03/2021 1406   PHURINE 5.0 11/03/2021 1406   GLUCOSEU NEGATIVE 11/03/2021 1406   HGBUR NEGATIVE 11/03/2021 1406   BILIRUBINUR NEGATIVE 11/03/2021 1406   KETONESUR NEGATIVE 11/03/2021 1406   PROTEINUR NEGATIVE 11/03/2021 1406   NITRITE POSITIVE (A) 11/03/2021 1406   LEUKOCYTESUR LARGE (A) 11/03/2021 1406     Drugs of Abuse  No results  found for: LABOPIA, COCAINSCRNUR, LABBENZ, AMPHETMU, THCU, LABBARB    Radiological Exams on Admission: DG Chest Port 1 View Result Date: 05/08/2024 EXAM: 1 VIEW(S) XRAY OF THE CHEST 05/08/2024 01:27:00 PM COMPARISON: 04/21/2024 CLINICAL HISTORY: Vomiting in adult FINDINGS: LUNGS AND PLEURA: Elevated right hemidiaphragm. Bibasilar patchy opacities. No pulmonary edema. Blunting of bilateral costophrenic angles. No pneumothorax. HEART AND MEDIASTINUM: No acute abnormality of the cardiac and mediastinal silhouettes. BONES AND SOFT TISSUES: No acute osseous abnormality. IMPRESSION: 1. Bibasilar patchy opacities, which may reflect atelectasis, aspiration for pneumonia. 2. Blunting of bilateral costophrenic angles, suggests small effusions. Electronically signed by: Waddell Calk MD 05/08/2024 02:05 PM EST RP Workstation: [id number]    EKG: Personally reviewed by me which shows atrial fibrillation-rate controlled   Consultant: Cardiology  Code Status: Full code  Microbiology none  Antibiotics: None  Family Communication:  Patients' condition and plan of care including tests being ordered have been discussed with the patient and the patient's daughter and granddaughter at bedside who indicate understanding and agree with the plan.   Status is: Observation    Severity of Illness: The appropriate patient status for this patient is OBSERVATION. Observation status is judged to be reasonable and necessary in order to provide the required intensity of service to ensure the patient's safety. The patient's presenting symptoms, physical exam findings, and initial radiographic and laboratory data in the context of their medical condition is felt to place them at decreased risk for further clinical deterioration. Furthermore, it is anticipated that the patient will be medically stable for discharge from the hospital within 2 midnights of admission.   Signed, Vernal Alstrom, MD [facility] 05/08/2024

## 2024-05-08 NOTE — Hospital Course (Signed)
 Patient is 83 years old female with past medical history of atrial fibrillation, congestive heart failure, anemia, depression, hyperlipidemia, hypertension presented to endoscopy unit for TEE and cardioversion for her atrial fibrillation.  After TEE was done patient had a vomiting episode and possible aspiration.  Her pulse ox was in the 70s so anesthesia team suggested overnight observation in the hospital.  Of note patient was recently discharged 04/22/2024 with plans for outpatient cardioversion.  Patient was admitted at that time with new onset atrial fibrillation with RVR acute on chronic diastolic heart failure and atypical chest pain.  Patient seen at recovery unit.  No labs or any imaging available today.   Hypoxia likely secondary to aspiration from vomiting.  Pulse ox was in the 70% and plan is observation overnight.  Continue supplemental oxygen, aspiration precautions.  Paroxysmal atrial fibrillation.  Cardioversion was not attempted due to vomiting episode and aspiration.  Currently rate controlled.  Cardiology to follow.  Patient is on Eliquis for anticoagulation as outpatient  History of congestive heart failure.  Recent 2D echocardiogram from 05/05/2024 showed LV ejection fraction of 60 to 65% with grade 1 diastolic dysfunction.  Hyperlipidemia Not on statins.  Essential hypertension Continue to monitor.

## 2024-05-09 ENCOUNTER — Other Ambulatory Visit: Payer: Self-pay

## 2024-05-09 ENCOUNTER — Encounter: Payer: Self-pay | Admitting: Cardiology

## 2024-05-09 DIAGNOSIS — R0902 Hypoxemia: Secondary | ICD-10-CM | POA: Diagnosis not present

## 2024-05-09 LAB — CBC
HCT: 32.9 % — ABNORMAL LOW (ref 36.0–46.0)
Hemoglobin: 10.5 g/dL — ABNORMAL LOW (ref 12.0–15.0)
MCH: 30.3 pg (ref 26.0–34.0)
MCHC: 31.9 g/dL (ref 30.0–36.0)
MCV: 94.8 fL (ref 80.0–100.0)
Platelets: 195 K/uL (ref 150–400)
RBC: 3.47 MIL/uL — ABNORMAL LOW (ref 3.87–5.11)
RDW: 13.1 % (ref 11.5–15.5)
WBC: 9 K/uL (ref 4.0–10.5)
nRBC: 0 % (ref 0.0–0.2)

## 2024-05-09 LAB — BASIC METABOLIC PANEL WITH GFR
Anion gap: 8 (ref 5–15)
BUN: 12 mg/dL (ref 8–23)
CO2: 26 mmol/L (ref 22–32)
Calcium: 7.8 mg/dL — ABNORMAL LOW (ref 8.9–10.3)
Chloride: 108 mmol/L (ref 98–111)
Creatinine, Ser: 0.74 mg/dL (ref 0.44–1.00)
GFR, Estimated: >60 mL/min (ref >60)
Glucose, Bld: 99 mg/dL (ref 70–99)
Potassium: 3.4 mmol/L — ABNORMAL LOW (ref 3.5–5.1)
Sodium: 142 mmol/L (ref 135–145)

## 2024-05-09 LAB — PHOSPHORUS: Phosphorus: 2.6 mg/dL (ref 2.5–4.6)

## 2024-05-09 LAB — ECHO TEE

## 2024-05-09 MED ORDER — METOPROLOL SUCCINATE ER 50 MG PO TB24
50.0000 mg | ORAL_TABLET | Freq: Two times a day (BID) | ORAL | 0 refills | Status: DC
  Filled 2024-05-09: qty 60, 30d supply, fill #0

## 2024-05-09 NOTE — Progress Notes (Signed)
 Mobility Specialist - Progress Note  Pre-mobility: HR-123,  SpO2-97%  During mobility: HR-108, SpO2-95%  Post-mobility: HR-139, SPO2- 94%   05/09/24 1000  Mobility  Activity Ambulated with assistance  Level of Assistance Independent after set-up  Assistive Device None  Distance Ambulated (ft) 180 ft  Range of Motion/Exercises All extremities  Activity Response Tolerated well  Mobility visit 1 Mobility  Mobility Specialist Start Time (ACUTE ONLY) 1020  Mobility Specialist Stop Time (ACUTE ONLY) 1031  Mobility Specialist Time Calculation (min) (ACUTE ONLY) 11 min   Pt was in chair on RA with guest in the room. Pt agreed to mobility. Pt O2 vitals were taken throughout activity as a precaution. Pt was able to STS independently with no AD. Pt ambulated well. During ambulation recovery break was needed to check vitals. O2 vitals remained above 94% throughout activity. After activity pt returned to the room in chair with needs in reach.  Clem Rodes Mobility Specialist 05/09/24, 10:40 AM

## 2024-05-09 NOTE — Plan of Care (Signed)
  Problem: Clinical Measurements: Goal: Ability to maintain clinical measurements within normal limits will improve Outcome: Progressing   Problem: Clinical Measurements: Goal: Cardiovascular complication will be avoided Outcome: Progressing   Problem: Activity: Goal: Risk for activity intolerance will decrease Outcome: Progressing   Problem: Pain Managment: Goal: General experience of comfort will improve and/or be controlled Outcome: Progressing   Problem: Safety: Goal: Ability to remain free from injury will improve Outcome: Progressing

## 2024-05-09 NOTE — Progress Notes (Incomplete)
  Progress Note   Patient: Lynn Mccoy FMW:[id number] DOB: 1941/03/14 DOA: 05/08/2024     0 DOS: the patient was seen and examined on 05/09/2024   Brief hospital course: Patient is 83 years old female with past medical history of atrial fibrillation, congestive heart failure, anemia, depression, hyperlipidemia, hypertension presented to endoscopy unit for TEE and cardioversion for her atrial fibrillation.  After TEE was done patient had a vomiting episode and possible aspiration.  Her pulse ox was in the 70s so anesthesia team suggested overnight observation in the hospital.  Of note patient was recently discharged 04/22/2024 with plans for outpatient cardioversion.  Patient was admitted at that time with new onset atrial fibrillation with RVR acute on chronic diastolic heart failure and atypical chest pain.  Patient seen at recovery unit.  No labs or any imaging available today.   Assessment and Plan: Hypoxia likely secondary to aspiration from vomiting.  Pulse ox was in the 70% and plan is observation overnight.  Continue supplemental oxygen, aspiration precautions.  Will observe off antibiotics.  Wean oxygen as able.   Paroxysmal atrial fibrillation.  Cardioversion was not attempted due to vomiting episode and aspiration.  Currently rate controlled.  Cardiology to follow.  Patient is on Eliquis for anticoagulation as outpatient.  Continue Eliquis   History of congestive diastolic heart failure.  Recent 2D echocardiogram from 05/05/2024 showed LV ejection fraction of 60 to 65% with grade 1 diastolic dysfunction.  Continue metoprolol.  Volume status appears to be okay at this time   Hyperlipidemia Not on statins.   Essential hypertension Continue to monitor.  On metoprolol 50 mg twice daily.  Latest blood pressure of 112/82   Addendum:   05/08/2024 6:06 PM   Labs reviewed.  Hemoglobin of 10.8 with creatinine 0.6.  Potassium 3.8 with magnesium  2.3 phosphorus was noted to be low at 2.4.  Will  replenish with K-Phos today.   DVT Prophylaxis: Eliquis   Review of Systems:     {Tip this will not be part of the note when signed Body mass index is 25.69 kg/m. , ,  (Optional):26781}  Subjective: ***  Physical Exam: Vitals:   05/08/24 2119 05/08/24 2339 05/09/24 0345 05/09/24 0800  BP: (!) 103/56 (!) 94/57 (!) 100/57 123/73  Pulse: 94  95 91  Resp: 18 17 14 16   Temp: 98.9 F (37.2 C) 98.4 F (36.9 C) 97.8 F (36.6 C) 98.4 F (36.9 C)  TempSrc: Oral  Oral Oral  SpO2: 93% 93% 94% 95%  Weight:      Height:       *** Data Reviewed: {Tip this will not be part of the note when signed- Document your independent interpretation of telemetry tracing, EKG, lab, Radiology test or any other diagnostic tests. Add any new diagnostic test ordered today. (Optional):26781} {Results:26384}  Family Communication: ***  Disposition: Status is: Observation {Observation:23811}  Planned Discharge Destination: {DISCHARGE DESTINATION_TRH:27031} {Tip this will not be part of the note when signed  DVT Prophylaxis  .Apixaban (eliquis) tablet 5 mg ,  (Optional):26781}   Time spent: *** minutes  Author: Meisha Salone A Demetrion Wesby, MD 05/09/2024 9:25 AM  For on call review [email or web address].

## 2024-05-10 NOTE — Discharge Summary (Signed)
 Physician Discharge Summary   Patient: Lynn Mccoy MRN: [id number] DOB: [birth year]  Admit date:     05/08/2024  Discharge date: 05/09/24  Discharge Physician: Terral DELENA Seashore   PCP: Gasper Nancyann BRAVO, MD   Recommendations at discharge:  Follow up heart rate and electrolytes at PCP visit  Discharge Diagnoses: Active Problems:   Atrial fibrillation (HCC)   Hypertension   Aspiration into airway  Principal Problem (Resolved):   Hypoxia  Hospital Course: Patient is 83 years old female with past medical history of atrial fibrillation, congestive heart failure, anemia, depression, hyperlipidemia, hypertension presented to endoscopy unit for TEE and cardioversion for her atrial fibrillation.  After TEE was done patient had a vomiting episode and possible aspiration.  Her pulse ox was in the 70s so anesthesia team suggested overnight observation in the hospital.  Of note patient was recently discharged 04/22/2024 with plans for outpatient cardioversion.  Patient was admitted at that time with new onset atrial fibrillation with RVR acute on chronic diastolic heart failure and atypical chest pain.  Patient seen at recovery unit.     Assessment and Plan: Hypoxia likely secondary to aspiration from vomiting.  Managed with supplemental oxygen, aspiration precautions. Observed off antibiotics.  Weaned off oxygen- Symptoms resolved   Paroxysmal atrial fibrillation. Cardioversion was not attempted due to vomiting episode and aspiration.  Currently rate controlled.  Cardiology to follow as outpatient (as per them no cardioversion during this stay).  Patient is on Eliquis for anticoagulation as outpatient.  Continue Eliquis & metoprolol.     History of congestive diastolic heart failure.  Recent 2D echocardiogram from 05/05/2024 showed LV ejection fraction of 60 to 65% with grade 1 diastolic dysfunction. Volume status appears to be okay at this time   Hyperlipidemia Not on statins.   Essential  hypertension On metoprolol 50 mg twice daily   Hypophosphatemia: phosphorus was noted to be low at 2.4 Replaced  Consultants: Cardiology  Disposition: Home Diet recommendation:  Discharge Diet Orders (From admission, onward)     Start     Ordered   05/09/24 0000  Diet - low sodium heart healthy        05/09/24 1251           Cardiac diet DISCHARGE MEDICATION: Allergies as of 05/09/2024   No Known Allergies      Medication List     TAKE these medications    alendronate  70 MG tablet Commonly known as: FOSAMAX  Take 1 tablet (70 mg total) by mouth every 7 (seven) days. Take with a full glass of water on an empty stomach.   cetirizine 10 MG tablet Commonly known as: ZYRTEC Take 10 mg by mouth daily.   cholecalciferol  1000 units tablet Commonly known as: VITAMIN D  Take 1,000 Units by mouth daily.   Eliquis 5 MG Tabs tablet Generic drug: apixaban Take 1 tablet (5 mg total) by mouth 2 (two) times daily.   ferrous sulfate  325 (65 FE) MG EC tablet Take 325 mg by mouth every other day.   metoprolol succinate 50 MG 24 hr tablet Commonly known as: TOPROL-XL Take 1 tablet (50 mg total) by mouth 2 (two) times daily.   NON FORMULARY Take 1 tablet by mouth daily. Neuriva OTC   NONFORMULARY OR COMPOUNDED ITEM Take 4 tablets by mouth daily. SeroVital- hgh   sertraline  100 MG tablet Commonly known as: ZOLOFT  TAKE ONE TABLET EVERY DAY   traZODone  100 MG tablet Commonly known as: DESYREL  TAKE ONE-HALF TO ONE TABLET  ONE HOUR BEFORE BEDTIME.        Follow-up Information     Alluri, Keller BROCKS, MD. Go in 1 week(s).   Specialty: Cardiology Why: Follow up as scheduled on 05/12/2024 at 9:30 AM Contact information: [address] [place] [address] [phone]                Discharge Exam: Lynn Mccoy   05/08/24 1223  Weight: 74.4 kg  General:  NAD/NARD. Alert & Oriented x 3. Pleasant and Cooperative.   Eyes: PERRLA. EOMI.   ENMT:   No  deformities.  MMM. Neck: Supple. No cervical LAD.  No JVD.  Respiratory:  CTAB. No wheezing/rales/rhonchi appreciated.  Normal respiratory rate and rhythm.  Cardiovascular: Heart: RRR. Normal S1/S2. No murmurs/gallops/rubs. No LE edema.   Abdomen:  Soft. NT.  ND. Normoactive BS present. No rebound tenderness, guarding, fluid or masses/organomegaly present.   Genitourinary: Genital exam deferred.  Musculoskeletal:  No deformities, induration, or joint effusion. Non-tender to palpation.   Skin:  No rashes, nodules, or evidence of bleeding/bruising. Neurologic: Grossly intact.  Psychologic: Normal affect/mood. Intact speech, language, and memory   Condition at discharge: stable  The results of significant diagnostics from this hospitalization (including imaging, microbiology, ancillary and laboratory) are listed below for reference.   Imaging Studies: ECHO TEE Result Date: 05/09/2024    TRANSESOPHOGEAL ECHO REPORT   Patient Name:   Lynn Mccoy Date of Exam: 05/08/2024 Medical Rec #:  [id number]      Height:       67.0 in Accession #:    [id number]     Weight:       168.0 lb Date of Birth:  [birth year]      BSA:          1.878 m Patient Age:    83 years       BP:           139/102 mmHg Patient Gender: F              HR:           92 bpm. Exam Location:  [facility] Procedure: Transesophageal Echo, Color Doppler and Cardiac Doppler (Both            Spectral and Color Flow Doppler were utilized during procedure). Indications:     Atrial Fibbrillation with RVR I48.91  History:         Patient has prior history of Echocardiogram examinations. Risk                  Factors:Hypertension and Dyslipidemia.  Sonographer:     Christopher Furnace Referring Phys:  [id number] CARALYN HUDSON Diagnosing Phys: Keller Paterson PROCEDURE: After discussion of the risks and benefits of a TEE, an informed consent was obtained from the patient. The transesophogeal probe was passed without difficulty through the esophogus of the patient.  Sedation performed by different physician. The patient was monitored while under deep sedation. Image quality was excellent. The patient's vital signs; including heart rate, blood pressure, and oxygen saturation; remained stable throughout the procedure. The patient developed no complications during the procedure. Post procedure patient had vomiting with possible aspiration resulting in hypoxia.  IMPRESSIONS  1. Left ventricular ejection fraction, by estimation, is 35 to 40%. The left ventricle has moderately decreased function.  2. Right ventricular systolic function is mildly reduced. The right ventricular size is normal.  3. Left atrial size was dilated. No left atrial/left atrial appendage thrombus was detected.  4. Right atrial size was dilated.  5. The mitral valve is normal in structure. Moderate mitral valve regurgitation.  6. Tricuspid valve regurgitation is moderate.  7. The aortic valve is tricuspid. Aortic valve regurgitation is mild. FINDINGS  Left Ventricle: Left ventricular ejection fraction, by estimation, is 35 to 40%. The left ventricle has moderately decreased function. The left ventricular internal cavity size was normal in size. Right Ventricle: The right ventricular size is normal. No increase in right ventricular wall thickness. Right ventricular systolic function is mildly reduced. Left Atrium: Left atrial size was dilated. No left atrial/left atrial appendage thrombus was detected. Right Atrium: Right atrial size was dilated. Pericardium: There is no evidence of pericardial effusion. Mitral Valve: The mitral valve is normal in structure. Moderate mitral valve regurgitation. Tricuspid Valve: The tricuspid valve is normal in structure. Tricuspid valve regurgitation is moderate. Aortic Valve: The aortic valve is tricuspid. Aortic valve regurgitation is mild. Pulmonic Valve: The pulmonic valve was normal in structure. Pulmonic valve regurgitation is trivial. Aorta: The aortic root is normal in  size and structure. IAS/Shunts: No atrial level shunt detected by color flow Doppler. Keller Paterson Electronically signed by Keller Paterson Signature Date/Time: 05/09/2024/6:03:27 AM    Final    DG Chest Port 1 View Result Date: 05/08/2024 EXAM: 1 VIEW(S) XRAY OF THE CHEST 05/08/2024 01:27:00 PM COMPARISON: 04/21/2024 CLINICAL HISTORY: Vomiting in adult FINDINGS: LUNGS AND PLEURA: Elevated right hemidiaphragm. Bibasilar patchy opacities. No pulmonary edema. Blunting of bilateral costophrenic angles. No pneumothorax. HEART AND MEDIASTINUM: No acute abnormality of the cardiac and mediastinal silhouettes. BONES AND SOFT TISSUES: No acute osseous abnormality. IMPRESSION: 1. Bibasilar patchy opacities, which may reflect atelectasis, aspiration for pneumonia. 2. Blunting of bilateral costophrenic angles, suggests small effusions. Electronically signed by: Waddell Calk MD 05/08/2024 02:05 PM EST RP Workstation: [id number]   CT Angio Chest Pulmonary Embolism (PE) W or WO Contrast Result Date: 04/21/2024 CLINICAL DATA:  Chest pain and shortness of breath since yesterday, atrial fibrillation EXAM: CT ANGIOGRAPHY CHEST WITH CONTRAST TECHNIQUE: Multidetector CT imaging of the chest was performed using the standard protocol during bolus administration of intravenous contrast. Multiplanar CT image reconstructions and MIPs were obtained to evaluate the vascular anatomy. RADIATION DOSE REDUCTION: This exam was performed according to the departmental dose-optimization program which includes automated exposure control, adjustment of the mA and/or kV according to patient size and/or use of iterative reconstruction technique. CONTRAST:  75mL OMNIPAQUE IOHEXOL 350 MG/ML SOLN COMPARISON:  04/21/2024 FINDINGS: Cardiovascular: This is a technically adequate evaluation of the pulmonary vasculature. No filling defects or pulmonary emboli. Mild cardiomegaly without pericardial effusion. No evidence of thoracic aortic aneurysm or  dissection. Atherosclerosis of the aorta and coronary vasculature. Mediastinum/Nodes: No enlarged mediastinal, hilar, or axillary lymph nodes. Thyroid  gland, trachea, and esophagus demonstrate no significant findings. Small hiatal hernia. Lungs/Pleura: Trace bilateral pleural effusions, right greater than left. No airspace disease or pneumothorax. The central airways are patent. Upper Abdomen: No acute abnormality. Musculoskeletal: No acute or destructive bony abnormalities. Bilateral shoulder osteoarthritis, right greater than left. Reconstructed images demonstrate no additional findings. Review of the MIP images confirms the above findings. IMPRESSION: 1. No evidence of pulmonary embolus. 2. Trace bilateral pleural effusions. 3. Cardiomegaly without pericardial effusion. 4. Aortic Atherosclerosis (ICD10-I70.0). Coronary artery atherosclerosis. Electronically Signed   By: Ozell Daring M.D.   On: 04/21/2024 17:37   DG Chest 2 View Result Date: 04/21/2024 EXAM: 2 VIEW(S) XRAY OF THE CHEST 04/21/2024 03:31:00 PM COMPARISON: None available. CLINICAL HISTORY: chest pain.  Chest pain chest pain. Chest pain FINDINGS: LUNGS AND PLEURA: Blunting of bilateral costophrenic angles. No focal pulmonary opacity. No pulmonary edema. No pneumothorax. HEART AND MEDIASTINUM: No acute abnormality of the cardiac and mediastinal silhouettes. BONES AND SOFT TISSUES: Suture anchor identified in the right humeral head. Multilevel endplate degenerative changes and disc space narrowing within the thoracic spine. IMPRESSION: 1. Blunting of bilateral costophrenic angles which may reflect trace bilateral pleural effusions. Electronically signed by: Waddell Calk MD 04/21/2024 03:59 PM EDT RP Workstation: [id number]    Microbiology: Results for orders placed or performed during the hospital encounter of 11/14/21  Urine Culture     Status: Abnormal   Collection Time: 11/03/21  2:06 PM   Specimen: Urine, Clean Catch  Result Value Ref  Range Status   Specimen Description   Final    URINE, CLEAN CATCH Performed at [facility], [address] [place], [place] [address]    Special Requests   Final    NONE Performed at [facility], [address] [place], [place] [address]    Culture >=100,000 COLONIES/mL ESCHERICHIA COLI (A)  Final   Report Status 11/06/2021 FINAL  Final   Organism ID, Bacteria ESCHERICHIA COLI (A)  Final      Susceptibility   Escherichia coli - MIC*    AMPICILLIN <=2 SENSITIVE Sensitive     CEFAZOLIN  <=4 SENSITIVE Sensitive     CEFEPIME <=0.12 SENSITIVE Sensitive     CEFTRIAXONE <=0.25 SENSITIVE Sensitive     CIPROFLOXACIN <=0.25 SENSITIVE Sensitive     GENTAMICIN <=1 SENSITIVE Sensitive     IMIPENEM <=0.25 SENSITIVE Sensitive     NITROFURANTOIN <=16 SENSITIVE Sensitive     TRIMETH/SULFA <=20 SENSITIVE Sensitive     AMPICILLIN/SULBACTAM <=2 SENSITIVE Sensitive     PIP/TAZO <=4 SENSITIVE Sensitive     * >=100,000 COLONIES/mL ESCHERICHIA COLI    Labs: CBC: Recent Labs  Lab 05/08/24 1448 05/09/24 0434  WBC 7.1 9.0  HGB 10.8* 10.5*  HCT 32.9* 32.9*  MCV 94.0 94.8  PLT 208 195   Basic Metabolic Panel: Recent Labs  Lab 05/08/24 1448 05/09/24 0434  NA 141 142  K 3.8 3.4*  CL 108 108  CO2 22 26  GLUCOSE 105* 99  BUN 13 12  CREATININE 0.67 0.74  CALCIUM 8.2* 7.8*  MG 2.3  --   PHOS 2.4* 2.6   Liver Function Tests: Recent Labs  Lab 05/08/24 1448  AST 77*  ALT 74*  ALKPHOS 43  BILITOT 0.7  PROT 6.7  ALBUMIN 3.4*   CBG: No results for input(s): GLUCAP in the last 168 hours.  Discharge time spent: less than 30 minutes.  Signed: Terral DELENA Seashore, MD [facility] 05/10/2024

## 2024-05-12 ENCOUNTER — Telehealth: Payer: Self-pay

## 2024-05-12 NOTE — Transitions of Care (Post Inpatient/ED Visit) (Signed)
 05/12/2024  Name: Lynn Mccoy MRN: [id number] DOB: [birth year]  Today's TOC FU Call Status: Today's TOC FU Call Status:: Successful TOC FU Call Completed Unsuccessful Call (1st Attempt) Date: 05/12/24 [facility] FU Call Complete Date: 05/12/24 Patient's Name and Date of Birth confirmed.  Transition Care Management Follow-up Telephone Call Date of Discharge: 05/09/24 Discharge Facility: [facility]) Type of Discharge: Inpatient Admission Primary Inpatient Discharge Diagnosis:: afib How have you been since you were released from the hospital?: Better Any questions or concerns?: No  Items Reviewed: Did you receive and understand the discharge instructions provided?: Yes Medications obtained,verified, and reconciled?: Yes (Medications Reviewed) Any new allergies since your discharge?: No Dietary orders reviewed?: Yes Do you have support at home?: No  Medications Reviewed Today: Medications Reviewed Today     Reviewed by Emmitt Pan, LPN (Licensed Practical Nurse) on 05/12/24 at 1448  Med List Status: <None>   Medication Order Taking? Sig Documenting Provider Last Dose Status Informant  alendronate  (FOSAMAX ) 70 MG tablet [id number] Yes Take 1 tablet (70 mg total) by mouth every 7 (seven) days. Take with a full glass of water on an empty stomach. Gasper Nancyann BRAVO, MD  Active Self, Pharmacy Records  apixaban [facility]) 5 MG TABS tablet [id number] Yes Take 1 tablet (5 mg total) by mouth 2 (two) times daily. Von Bellis, MD  Active   cetirizine (ZYRTEC) 10 MG tablet [id number] Yes Take 10 mg by mouth daily. [provider]  Active Self, Pharmacy Records  cholecalciferol  (VITAMIN D ) 1000 UNITS tablet [id number] Yes Take 1,000 Units by mouth daily. [provider]  Active Self, Pharmacy Records           Med Note KERRIN ELEANOR JONELLE Pablo Oct 31, 2021  3:43 PM)    ferrous sulfate  325 (65 FE) MG EC tablet [id number] Yes Take 325 mg by mouth every  other day. [provider]  Active Self, Pharmacy Records  metoprolol succinate (TOPROL-XL) 50 MG 24 hr tablet [id number] Yes Take 1 tablet (50 mg total) by mouth 2 (two) times daily. Monda Terral LABOR, MD  Active   JESSE SCHLOSSMAN [id number] Yes Take 1 tablet by mouth daily. Neuriva OTC [provider]  Active Self, Pharmacy Records  NONFORMULARY OR COMPOUNDED ITEM [id number] Yes Take 4 tablets by mouth daily. SeroVital- hgh [provider]  Active Self, Pharmacy Records  sertraline  (ZOLOFT ) 100 MG tablet [id number] Yes TAKE ONE TABLET EVERY DAY Gasper Nancyann BRAVO, MD  Active Self, Pharmacy Records           Med Note [name], [name]   Mon Apr 21, 2024  7:02 PM) Taking three times per week  traZODone  (DESYREL ) 100 MG tablet [id number] Yes TAKE ONE-HALF TO ONE TABLET ONE HOUR BEFORE BEDTIME. Gasper Nancyann BRAVO, MD  Active Self, Pharmacy Records            Home Care and Equipment/Supplies: Were Home Health Services Ordered?: NA Any new equipment or medical supplies ordered?: NA  Functional Questionnaire: Do you need assistance with bathing/showering or dressing?: No Do you need assistance with meal preparation?: No Do you need assistance with eating?: No Do you have difficulty maintaining continence: No Do you need assistance with getting out of bed/getting out of a chair/moving?: No Do you have difficulty managing or taking your medications?: No  Follow up appointments reviewed: PCP Follow-up appointment confirmed?: Yes MD Provider Line Number:[phone] Given: No Date of PCP follow-up appointment?: 05/14/24 Follow-up Provider: [facility]  Follow-up appointment confirmed?: NA Do you need transportation to your follow-up appointment?: No Do you understand care options if your condition(s) worsen?: Yes-patient verbalized understanding    SIGNATURE Julian Lemmings, LPN [facility] Nurse Health Advisor Direct Dial [phone]

## 2024-05-12 NOTE — Transitions of Care (Post Inpatient/ED Visit) (Signed)
   05/12/2024  Name: Lynn Mccoy MRN: [id number] DOB: 1941/01/21  Today's TOC FU Call Status: Today's TOC FU Call Status:: Unsuccessful Call (1st Attempt) Unsuccessful Call (1st Attempt) Date: 05/12/24  Attempted to reach the patient regarding the most recent Inpatient/ED visit.  Follow Up Plan: Additional outreach attempts will be made to reach the patient to complete the Transitions of Care (Post Inpatient/ED visit) call.   Signature Julian Lemmings, LPN [facility] Nurse Health Advisor Direct Dial [phone]

## 2024-05-12 NOTE — Anesthesia Postprocedure Evaluation (Signed)
 Anesthesia Post Note  Patient: Lynn Mccoy  Procedure(s) Performed: ECHOCARDIOGRAM, TRANSESOPHAGEAL CARDIOVERSION  Patient location during evaluation: PACU Anesthesia Type: General Level of consciousness: awake and alert Pain management: pain level controlled Vital Signs Assessment: post-procedure vital signs reviewed and stable Respiratory status: spontaneous breathing, nonlabored ventilation, respiratory function stable and patient connected to nasal cannula oxygen Cardiovascular status: blood pressure returned to baseline and stable Postop Assessment: no apparent nausea or vomiting Anesthetic complications: yes   Encounter Notable Events  Notable Event Outcome Phase Comment  Aspiration Resolved in Lab Intraprocedure Pt with emesis while under anesthesia; desaturation to upper 80s resovled within 15 minutes of event.     Last Vitals:  Vitals:   05/09/24 0345 05/09/24 0800  BP: (!) 100/57 123/73  Pulse: 95 91  Resp: 14 16  Temp: 36.6 C 36.9 C  SpO2: 94% 95%    Last Pain:  Vitals:   05/09/24 0800  TempSrc: Oral  PainSc: 0-No pain                 [name]

## 2024-05-14 ENCOUNTER — Encounter: Payer: Self-pay | Admitting: Family Medicine

## 2024-05-14 ENCOUNTER — Ambulatory Visit: Admitting: Family Medicine

## 2024-05-14 VITALS — BP 130/90 | HR 81 | Resp 14 | Ht 67.0 in | Wt 174.1 lb

## 2024-05-14 DIAGNOSIS — I10 Essential (primary) hypertension: Secondary | ICD-10-CM

## 2024-05-14 DIAGNOSIS — M816 Localized osteoporosis [Lequesne]: Secondary | ICD-10-CM

## 2024-05-14 DIAGNOSIS — I4891 Unspecified atrial fibrillation: Secondary | ICD-10-CM | POA: Diagnosis not present

## 2024-05-14 DIAGNOSIS — G4700 Insomnia, unspecified: Secondary | ICD-10-CM

## 2024-05-14 DIAGNOSIS — Z0001 Encounter for general adult medical examination with abnormal findings: Secondary | ICD-10-CM

## 2024-05-14 DIAGNOSIS — Z0000 Encounter for general adult medical examination without abnormal findings: Secondary | ICD-10-CM

## 2024-05-14 DIAGNOSIS — F324 Major depressive disorder, single episode, in partial remission: Secondary | ICD-10-CM

## 2024-05-14 DIAGNOSIS — M810 Age-related osteoporosis without current pathological fracture: Secondary | ICD-10-CM

## 2024-05-14 DIAGNOSIS — Z23 Encounter for immunization: Secondary | ICD-10-CM

## 2024-05-14 DIAGNOSIS — R052 Subacute cough: Secondary | ICD-10-CM

## 2024-05-14 DIAGNOSIS — F4321 Adjustment disorder with depressed mood: Secondary | ICD-10-CM

## 2024-05-14 MED ORDER — BENZONATATE 100 MG PO CAPS: 100.0000 mg | ORAL_CAPSULE | Freq: Three times a day (TID) | ORAL | 0 refills | Status: AC | PRN

## 2024-05-14 MED ORDER — CEPHALEXIN 500 MG PO CAPS: 500.0000 mg | ORAL_CAPSULE | Freq: Three times a day (TID) | ORAL | 0 refills | Status: AC

## 2024-05-14 MED ORDER — ALENDRONATE SODIUM 70 MG PO TABS: 70.0000 mg | ORAL_TABLET | ORAL | 4 refills | Status: AC

## 2024-05-14 NOTE — Progress Notes (Signed)
 Chief Complaint  Patient presents with   Annual Exam     Subjective:   Lynn Mccoy is a 83 y.o. female who presents for a Medicare Annual Wellness Visit.  Allergies (verified) Patient has no known allergies.   History: Past Medical History:  Diagnosis Date   Anemia    Cancer (HCC)    ovarian ca   Depression    History of cervical cancer 12/29/2014   stage 4, s/p complete hysterectomy    History of mumps as a child    Hyperlipidemia    Hypertension    Osteoarthrosis    knees   Osteopenia    Wears dentures    full upper and lower   Past Surgical History:  Procedure Laterality Date   ABDOMINAL HYSTERECTOMY  1977   due to cervical cancer stage 4   APPENDECTOMY  1977   arthritis Bilateral    CARDIOVERSION N/A 05/08/2024   Procedure: CARDIOVERSION;  Surgeon: Wilburn Keller BROCKS, MD;  Location: [facility];  Service: Cardiovascular;  Laterality: N/A;   CATARACT EXTRACTION W/PHACO Left 03/06/2018   Procedure: CATARACT EXTRACTION PHACO AND INTRAOCULAR LENS PLACEMENT (IOC) COMPLICATED LEFT;  Surgeon: Mittie Gaskin, MD;  Location: [facility];  Service: Ophthalmology;  Laterality: Left;  HEALON 5 VISION BLUE CAPSULAR TENSION RING MALYUGIN   COLONOSCOPY     KNEE ARTHROPLASTY Left 11/14/2021   Procedure: COMPUTER ASSISTED TOTAL KNEE ARTHROPLASTY;  Surgeon: Mardee Lynwood SQUIBB, MD;  Location: [facility];  Service: Orthopedics;  Laterality: Left;   OOPHORECTOMY     REPLACEMENT TOTAL KNEE Right    ROTATOR CUFF REPAIR Right about 2006   Dr. Cleotilde   TEE WITHOUT CARDIOVERSION N/A 05/08/2024   Procedure: ECHOCARDIOGRAM, TRANSESOPHAGEAL;  Surgeon: Alluri, Keller BROCKS, MD;  Location: [facility];  Service: Cardiovascular;  Laterality: N/A;   Family History  Problem Relation Age of Onset   Leukemia Mother    Lung cancer Father    Dementia Sister    Alzheimer's disease Sister    Heart attack Brother    Rheum arthritis Sister    COPD Sister    Breast cancer Neg Hx    Social  History   Occupational History   Occupation: Retired    Comment: former scientist, research (medical)  Tobacco Use   Smoking status: Never   Smokeless tobacco: Never  Vaping Use   Vaping status: Never Used  Substance and Sexual Activity   Alcohol use: Yes    Alcohol/week: 0.0 - 1.0 standard drinks of alcohol   Drug use: No   Sexual activity: Not Currently   Tobacco Counseling Counseling given: Not Answered  SDOH Screenings   Food Insecurity: No Food Insecurity (05/09/2024)  Housing: Low Risk  (05/09/2024)  Transportation Needs: No Transportation Needs (05/09/2024)  Utilities: Not At Risk (05/09/2024)  Alcohol Screen: Low Risk  (11/06/2022)  Depression (PHQ2-9): Low Risk  (05/14/2024)  Financial Resource Strain: Low Risk  (12/18/2023)  Physical Activity: Insufficiently Active (12/18/2023)  Social Connections: Moderately Isolated (05/09/2024)  Stress: No Stress Concern Present (12/18/2023)  Tobacco Use: Low Risk  (05/14/2024)  Health Literacy: Adequate Health Literacy (03/07/2023)   Depression Screen    05/14/2024    9:03 AM 03/07/2023    8:49 AM 11/06/2022    8:20 AM 04/26/2022    9:22 AM 08/29/2021    8:34 AM 04/25/2021    9:12 AM 04/21/2020    8:35 AM  PHQ 2/9 Scores  PHQ - 2 Score 2 1 0 1 1 1  3  PHQ- 9 Score 4  1  4  4  4  8       Data saved with a previous flowsheet row definition      Goals Addressed   None    Functional Status Activities of Daily Living (to include ambulation/medication): (Patient-Rptd) Independent Ambulation: (Patient-Rptd) Independent Home Management: (Patient-Rptd) Independent  Fall Screening Falls in the past year?: (Patient-Rptd) 0 Was there an injury with Fall?: (Patient-Rptd) 0 Patient Fall Risk Level: Moderate fall risk  Advance Directives (For Healthcare) Does Patient Have a Medical Advance Directive?: Yes        Objective:    Today's Vitals   05/14/24 0857  BP: (!) 130/90  Pulse: 81  Resp: 14  SpO2: 98%  Weight: 174 lb 1.6 oz (79 kg)   Height: 5' 7 (1.702 m)   Body mass index is 27.27 kg/m.  Current Medications (verified) Outpatient Encounter Medications as of 05/14/2024  Medication Sig   alendronate  (FOSAMAX ) 70 MG tablet Take 1 tablet (70 mg total) by mouth every 7 (seven) days. Take with a full glass of water on an empty stomach.   apixaban (ELIQUIS) 5 MG TABS tablet Take 1 tablet (5 mg total) by mouth 2 (two) times daily.   cetirizine (ZYRTEC) 10 MG tablet Take 10 mg by mouth daily.   cholecalciferol  (VITAMIN D ) 1000 UNITS tablet Take 1,000 Units by mouth daily.   ferrous sulfate  325 (65 FE) MG EC tablet Take 325 mg by mouth every other day.   metoprolol succinate (TOPROL-XL) 50 MG 24 hr tablet Take 1 tablet (50 mg total) by mouth 2 (two) times daily.   NON FORMULARY Take 1 tablet by mouth daily. Neuriva OTC   NONFORMULARY OR COMPOUNDED ITEM Take 4 tablets by mouth daily. SeroVital- hgh   sertraline  (ZOLOFT ) 100 MG tablet TAKE ONE TABLET EVERY DAY   traZODone  (DESYREL ) 100 MG tablet TAKE ONE-HALF TO ONE TABLET ONE HOUR BEFORE BEDTIME.   No facility-administered encounter medications on file as of 05/14/2024.   Hearing/Vision screen No results found. Immunizations and Health Maintenance Health Maintenance  Topic Date Due   COVID-19 Vaccine (7 - 2025-26 season) 03/03/2024   Medicare Annual Wellness (AWV)  03/06/2024   DEXA SCAN  07/30/2025   DTaP/Tdap/Td (4 - Td or Tdap) 12/10/2025   Pneumococcal Vaccine: 50+ Years  Completed   Influenza Vaccine  Completed   Zoster Vaccines- Shingrix  Completed   Meningococcal B Vaccine  Aged Out        Assessment/Plan:  This is a routine wellness examination for [name].  Patient Care Team: Gasper Nancyann BRAVO, MD as PCP - General (Family Medicine) Maryl Zachary LELON Mickey., MD (Rheumatology) Mittie Gaskin, MD as Referring Physician (Ophthalmology)  I have personally reviewed and noted the following in the patient's chart:   Medical and social history Use of  alcohol, tobacco or illicit drugs  Current medications and supplements including opioid prescriptions. Functional ability and status Nutritional status Physical activity Advanced directives List of other physicians Hospitalizations, surgeries, and ER visits in previous 12 months Vitals Screenings to include cognitive, depression, and falls Referrals and appointments  Orders Placed This Encounter  Procedures   Flu vaccine HIGH DOSE PF(Fluzone Trivalent)   In addition, I have reviewed and discussed with patient certain preventive protocols, quality metrics, and best practice recommendations. A written personalized care plan for preventive services as well as general preventive health recommendations were provided to patient.   Nancyann Gasper, MD   05/14/2024   No  follow-ups on file.  After Visit Summary: (In Person-Printed) AVS printed and given to the patient  Nurse Notes: none

## 2024-05-14 NOTE — Progress Notes (Signed)
 Complete physical exam   Patient: Lynn Mccoy   DOB: 09/10/1940   83 y.o. Female  MRN: [id number] Visit Date: 05/14/2024  Today's healthcare provider: Nancyann Perry, MD   Chief Complaint  Patient presents with   Annual Exam   Subjective    Discussed the use of AI scribe software for clinical note transcription with the patient, who gave verbal consent to proceed.  History of Present Illness   SKI POLICH is an 83 year old female with atrial fibrillation who presents for an annual physical exam.  She has a history of atrial fibrillation and was recently hospitalized. During her hospital stay, a cardioversion was planned but not completed due to scheduling issues. She is awaiting rescheduling of the procedure. She is currently taking Eliquis, a blood thinner, and metoprolol for her heart condition.  She has been experiencing a persistent cough since her hospital discharge, which she attributes to a possible cold. She recalls undergoing a procedure involving an upper endoscopy during her hospital stay, which may have contributed to her symptoms.  Her medication regimen includes sertraline  (Zoloft ) at a half dose daily for mood, which is stable, and trazodone  at night for sleep, also at a half dose. Additionally, she takes an iron supplement, vitamin D , and Fosamax  once a week for osteoporosis.  Her social history includes limited physical activity, as she mostly stays at home watching television. She received the RSV vaccine last year.     Lab Results  Component Value Date   NA 142 05/09/2024   K 3.4 (L) 05/09/2024   CREATININE 0.74 05/09/2024   GFRNONAA >60 05/09/2024   GLUCOSE 99 05/09/2024   Lab Results  Component Value Date   WBC 9.0 05/09/2024   HGB 10.5 (L) 05/09/2024   HCT 32.9 (L) 05/09/2024   MCV 94.8 05/09/2024   PLT 195 05/09/2024       Past Medical History:  Diagnosis Date   Anemia    Cancer (HCC)    ovarian ca   Depression    History of  cervical cancer 12/29/2014   stage 4, s/p complete hysterectomy    History of mumps as a child    Hyperlipidemia    Hypertension    Osteoarthrosis    knees   Osteopenia    Wears dentures    full upper and lower   Past Surgical History:  Procedure Laterality Date   ABDOMINAL HYSTERECTOMY  1977   due to cervical cancer stage 4   APPENDECTOMY  1977   arthritis Bilateral    CARDIOVERSION N/A 05/08/2024   Procedure: CARDIOVERSION;  Surgeon: Wilburn Keller BROCKS, MD;  Location: [facility];  Service: Cardiovascular;  Laterality: N/A;   CATARACT EXTRACTION W/PHACO Left 03/06/2018   Procedure: CATARACT EXTRACTION PHACO AND INTRAOCULAR LENS PLACEMENT (IOC) COMPLICATED LEFT;  Surgeon: Mittie Gaskin, MD;  Location: [facility];  Service: Ophthalmology;  Laterality: Left;  HEALON 5 VISION BLUE CAPSULAR TENSION RING MALYUGIN   COLONOSCOPY     KNEE ARTHROPLASTY Left 11/14/2021   Procedure: COMPUTER ASSISTED TOTAL KNEE ARTHROPLASTY;  Surgeon: Mardee Lynwood SQUIBB, MD;  Location: [facility];  Service: Orthopedics;  Laterality: Left;   OOPHORECTOMY     REPLACEMENT TOTAL KNEE Right    ROTATOR CUFF REPAIR Right about 2006   Dr. Cleotilde   TEE WITHOUT CARDIOVERSION N/A 05/08/2024   Procedure: ECHOCARDIOGRAM, TRANSESOPHAGEAL;  Surgeon: Alluri, Keller BROCKS, MD;  Location: [facility];  Service: Cardiovascular;  Laterality: N/A;   Social History  Socioeconomic History   Marital status: Widowed    Spouse name: Not on file   Number of children: 3   Years of education: 73   Highest education level: 12th grade  Occupational History   Occupation: Retired    Comment: former scientist, research (medical)  Tobacco Use   Smoking status: Never   Smokeless tobacco: Never  Vaping Use   Vaping status: Never Used  Substance and Sexual Activity   Alcohol use: Yes    Alcohol/week: 0.0 - 1.0 standard drinks of alcohol   Drug use: No   Sexual activity: Not Currently  Other Topics Concern   Not on file  Social History Narrative    Lives alone   Social Drivers of Health   Financial Resource Strain: Low Risk  (12/18/2023)   Overall Financial Resource Strain (CARDIA)    Difficulty of Paying Living Expenses: Not hard at all  Food Insecurity: No Food Insecurity (05/09/2024)   Hunger Vital Sign    Worried About Running Out of Food in the Last Year: Never true    Ran Out of Food in the Last Year: Never true  Transportation Needs: No Transportation Needs (05/09/2024)   PRAPARE - Administrator, Civil Service (Medical): No    Lack of Transportation (Non-Medical): No  Physical Activity: Insufficiently Active (12/18/2023)   Exercise Vital Sign    Days of Exercise per Week: 2 days    Minutes of Exercise per Session: 40 min  Stress: No Stress Concern Present (12/18/2023)   [employer] of Occupational Health - Occupational Stress Questionnaire    Feeling of Stress: Only a little  Social Connections: Moderately Isolated (05/09/2024)   Social Connection and Isolation Panel    Frequency of Communication with Friends and Family: More than three times a week    Frequency of Social Gatherings with Friends and Family: More than three times a week    Attends Religious Services: 1 to 4 times per year    Active Member of [employer] or Organizations: No    Attends Banker Meetings: Never    Marital Status: Widowed  Intimate Partner Violence: Not At Risk (05/09/2024)   Humiliation, Afraid, Rape, and Kick questionnaire    Fear of Current or Ex-Partner: No    Emotionally Abused: No    Physically Abused: No    Sexually Abused: No   Family Status  Relation Name Status   Mother  Deceased       Cause of Death: Leukemia   Father  Deceased       Cause of Death: Lung Cancer   Sister  Deceased   Brother  Deceased       Cause of death: MI   Sister Inocente Sprang Deceased   Neg Hx  (Not Specified)  No partnership data on file   Family History  Problem Relation Age of Onset   Leukemia Mother    Lung cancer  Father    Dementia Sister    Alzheimer's disease Sister    Heart attack Brother    Rheum arthritis Sister    COPD Sister    Breast cancer Neg Hx    No Known Allergies  Patient Care Team: Gasper Nancyann BRAVO, MD as PCP - General (Family Medicine) Maryl Zachary LELON Mickey., MD (Rheumatology) Mittie Gaskin, MD as Referring Physician (Ophthalmology)   Medications: Outpatient Medications Prior to Visit  Medication Sig   alendronate  (FOSAMAX ) 70 MG tablet Take 1 tablet (70 mg total) by mouth every 7 (  seven) days. Take with a full glass of water on an empty stomach.   apixaban (ELIQUIS) 5 MG TABS tablet Take 1 tablet (5 mg total) by mouth 2 (two) times daily.   cetirizine (ZYRTEC) 10 MG tablet Take 10 mg by mouth daily.   cholecalciferol  (VITAMIN D ) 1000 UNITS tablet Take 1,000 Units by mouth daily.   ferrous sulfate  325 (65 FE) MG EC tablet Take 325 mg by mouth every other day.   metoprolol succinate (TOPROL-XL) 50 MG 24 hr tablet Take 1 tablet (50 mg total) by mouth 2 (two) times daily.   NON FORMULARY Take 1 tablet by mouth daily. Neuriva OTC   NONFORMULARY OR COMPOUNDED ITEM Take 4 tablets by mouth daily. SeroVital- hgh   sertraline  (ZOLOFT ) 100 MG tablet TAKE ONE TABLET EVERY DAY   traZODone  (DESYREL ) 100 MG tablet TAKE ONE-HALF TO ONE TABLET ONE HOUR BEFORE BEDTIME.   No facility-administered medications prior to visit.    Review of Systems  Constitutional:  Negative for appetite change, chills, fatigue and fever.  Respiratory:  Negative for chest tightness and shortness of breath.   Cardiovascular:  Negative for chest pain and palpitations.  Gastrointestinal:  Negative for abdominal pain, nausea and vomiting.  Neurological:  Negative for dizziness and weakness.      Objective    BP (!) 130/90   Pulse 81   Resp 14   Ht 5' 7 (1.702 m)   Wt 174 lb 1.6 oz (79 kg)   SpO2 98%   BMI 27.27 kg/m    Physical Exam  General Appearance:    Well developed, well nourished  female. Alert, cooperative, in no acute distress, appears stated age   Head:    Normocephalic, without obvious abnormality, atraumatic  Eyes:    PERRL, conjunctiva/corneas clear, EOM's intact, fundi    benign, both eyes  Ears:    Normal TM's and external ear canals, both ears  Nose:   Nares normal, septum midline, mucosa normal, no drainage    or sinus tenderness  Throat:   Lips, mucosa, and tongue normal; teeth and gums normal  Neck:   Supple, symmetrical, trachea midline, no adenopathy;    thyroid :  no enlargement/tenderness/nodules; no carotid   bruit or JVD  Back:     Symmetric, no curvature, ROM normal, no CVA tenderness  Lungs:     Clear to auscultation bilaterally, respirations unlabored  Chest Wall:    No tenderness or deformity   Heart:    Normal heart rate. Irregularly irregular rhythm.  2/6 high pitched, blowing, decrescendo, diastolic murmur at the left third intercostal space and lower sternal border  Breast Exam:    deferred  Abdomen:     Soft, non-tender, bowel sounds active all four quadrants,    no masses, no organomegaly  Pelvic:    deferred  Extremities:   All extremities are intact. No cyanosis or edema  Pulses:   2+ and symmetric all extremities  Skin:   Skin color, texture, turgor normal, no rashes or lesions  Lymph nodes:   Cervical, supraclavicular, and axillary nodes normal  Neurologic:   CNII-XII intact, normal strength, sensation and reflexes    throughout      Assessment & Plan    Routine Health Maintenance and Physical Exam  Exercise Activities and Dietary recommendations  Goals      Exercise 3x per week (30 min per time)     Recommend to walk 3x per week (30 min per time).  Increase water intake     Recommend drinking 6-8 glasses of water per day.         Immunization History  Administered Date(s) Administered   Fluad Quad(high Dose 65+) 04/21/2019, 04/21/2020, 04/25/2021, 04/26/2022   Fluad Trivalent(High Dose 65+) 05/09/2023    INFLUENZA, HIGH DOSE SEASONAL PF 04/11/2017, 04/08/2018, 05/14/2024   PFIZER(Purple Top)SARS-COV-2 Vaccination 08/06/2019, 08/27/2019, 04/12/2020, 02/14/2021   Pfizer Covid-19 Vaccine Bivalent Booster 59yrs & up 05/18/2021   Pfizer(Comirnaty)Fall Seasonal Vaccine 12 years and older 06/12/2023   Pneumococcal Conjugate-13 03/12/2014   Pneumococcal Polysaccharide-23 03/22/2005   Respiratory Syncytial Virus Vaccine,Recomb Aduvanted(Arexvy) 07/03/2022   Td 01/19/1997, 11/23/2008   Tdap 12/11/2015   Zoster Recombinant(Shingrix) 05/29/2018, 08/16/2018   Zoster, Live 10/28/2007    Health Maintenance  Topic Date Due   COVID-19 Vaccine (7 - 2025-26 season) 03/03/2024   Medicare Annual Wellness (AWV)  03/06/2024   DEXA SCAN  07/30/2025   DTaP/Tdap/Td (4 - Td or Tdap) 12/10/2025   Pneumococcal Vaccine: 50+ Years  Completed   Influenza Vaccine  Completed   Zoster Vaccines- Shingrix  Completed   Meningococcal B Vaccine  Aged Out    Discussed health benefits of physical activity, and encouraged her to engage in regular exercise appropriate for her age and condition.         - Scheduled physical for next year.  Atrial fibrillation Management ongoing with Eliquis and metoprolol. Awaiting cardioversion due to scheduling issues. - Continue Eliquis and metoprolol. - Await scheduling for cardioversion.  Subacute cough Persistent cough possibly due to irritation from recent upper endoscopy or bacterial infection. - Prescribed cephalexin . - Prescribed tessalon  Osteoporosis Managed with weekly Fosamax . Prescription extended for convenience. - Sent prescription for three months of Fosamax .  Major depression single episode in partial remission with insomnia Managed with sertraline  and trazodone . Current regimen effective with stable mood and sleep. - Continue sertraline  and trazodone .  Essential hypertension Blood pressure slightly elevated, likely due to atrial fibrillation. No changes to  management. - Continue current antihypertensive regimen.            Nancyann Perry, MD  [facility] [phone] (phone) [phone] (fax)  [facility]

## 2024-05-14 NOTE — Patient Instructions (Signed)
 SABRA  Please review the attached list of medications and notify my office if there are any errors.   . Please bring all of your medications to every appointment so we can make sure that our medication list is the same as yours.

## 2024-05-19 ENCOUNTER — Other Ambulatory Visit: Payer: Self-pay | Admitting: Family Medicine

## 2024-05-19 ENCOUNTER — Ambulatory Visit: Payer: Self-pay

## 2024-05-19 ENCOUNTER — Ambulatory Visit: Admitting: Physician Assistant

## 2024-05-19 DIAGNOSIS — R052 Subacute cough: Secondary | ICD-10-CM

## 2024-05-19 MED ORDER — DOXYCYCLINE HYCLATE 100 MG PO TABS: 100.0000 mg | ORAL_TABLET | Freq: Two times a day (BID) | ORAL | 0 refills | Status: AC

## 2024-05-19 NOTE — Progress Notes (Addendum)
 New Patient Visit    Chief Complaint: Chief Complaint  Patient presents with  . Follow-up    Follow up- Pt C/O being tired and having swollen ankles    Date of Service: 05/21/2024 Date of Birth: 07-Jan-1941 PCP: Gasper Nancyann BRAVO, MD  History of Present Illness: Lynn Mccoy is a 83 y.o.female patient who presents to establish care. PMH significant for mild aortic regurgitation, hypertension, anemia, heart murmur, s/p total left knee replacement, s/p total right knee replacement, fatigue, depression, osteoarthritis, insomnia.  Today, pt presents with some recent tiredness and swollen ankles. Start eliquis . Start metoprolol . Refer to pulmonary for further assessment of cough. Start lasix .     Past Medical and Surgical History  Past Medical History Past Medical History:  Diagnosis Date  . Adjustment disorder with depressed mood 01/13/2009  . Allergic rhinitis 01/22/2015  . Diverticulosis of colon without hemorrhage 10/29/2008  . Fatigue 12/29/2014  . Insomnia 12/06/2009  . Osteoarthritis   . Osteopenia 11/24/2008   Hip on BMD 2012    Past Surgical History She has a past surgical history that includes Colonoscopy (01/11/2004); Colonoscopy (04/06/2014); Arthroscopic Rotator Cuff Repair (Right); Right total knee arthroplasty, robot assisted Makoplasty (08/10/2016); Vaginal hysterectomy; and Left total knee arthroplasty using computer-assisted navigation (11/14/2021).   Medications and Allergies  Current Medications Current Outpatient Medications  Medication Sig Dispense Refill  . acetaminophen  (TYLENOL ) 500 MG tablet Take 1,000 mg by mouth every 6 (six) hours as needed for Pain    . alendronate  (FOSAMAX ) 70 MG tablet Take 70 mg by mouth every 7 (seven) days    . amLODIPine  (NORVASC ) 2.5 MG tablet Take 2.5 mg by mouth once daily    . apixaban  (ELIQUIS ) 5 mg tablet Take 5 mg by mouth 2 (two) times daily    . benzonatate  (TESSALON ) 100 MG capsule Take 100 mg by mouth 3 (three) times daily    .  cephalexin  (KEFLEX ) 500 MG capsule Take 500 mg by mouth 3 (three) times daily    . cetirizine (ZYRTEC) 10 MG tablet Take 10 mg by mouth once daily    . cholecalciferol  (VITAMIN D3) 1000 unit tablet Take 1,000 Units by mouth once daily    . diclofenac (VOLTAREN) 1 % topical gel Apply 2 g topically 2 (two) times daily    . doxycycline  (VIBRA -TABS) 100 MG tablet Take 100 mg by mouth 2 (two) times daily    . ferrous sulfate  325 (65 FE) MG EC tablet Take 325 mg by mouth every other day    . ibuprofen (MOTRIN) 200 MG tablet Take 200 mg by mouth every 6 (six) hours as needed for Pain    . meloxicam (MOBIC) 7.5 MG tablet Take 1 tablet (7.5 mg total) by mouth once daily 30 tablet 1  . metoprolol  SUCCinate (TOPROL -XL) 50 MG XL tablet Take 50 mg by mouth 2 (two) times daily    . metoprolol  TARTrate (LOPRESSOR ) 50 MG tablet Take 50 mg by mouth 2 (two) times daily    . naproxen sodium (ALEVE) 220 MG tablet Take 220 mg by mouth once daily as needed for Pain    . NON FORMULARY Take 1 tablet by mouth once daily Neuriva    . NON FORMULARY Take 4 tablets by mouth at bedtime [facility]    . sertraline  (ZOLOFT ) 100 MG tablet Take 100 mg by mouth once daily as needed (DEPRESSION) Taking 0.5 take    . traZODone  (DESYREL ) 100 MG tablet 1/2 or 1 tablet 1 hour before bedtime    .  amLODIPine  (NORVASC ) 2.5 MG tablet Take 1 tablet by mouth once daily (Patient not taking: Reported on 05/21/2024)    . apixaban  (ELIQUIS ) 5 mg tablet Take 1 tablet (5 mg total) by mouth every 12 (twelve) hours 180 tablet 2  . FUROsemide  (LASIX ) 20 MG tablet Take 1 tablet (20 mg total) by mouth once daily 90 tablet 3  . metoprolol  TARTrate (LOPRESSOR ) 25 MG tablet Take 1 tablet (25 mg total) by mouth 2 (two) times daily 180 tablet 3   No current facility-administered medications for this visit.    Allergies Patient has no known allergies.  Social and Family History  Social History  reports that she has never smoked. She has never used  smokeless tobacco. She reports current alcohol use. She reports that she does not use drugs.  Family History family history includes Arthritis in her sister.   Review of Systems   Pertinent positives and negatives are mentioned above in HPI and all other systems are negative.  Physical Examination   Vitals:BP 122/72   Pulse 99   Ht 170.2 cm (5' 7)   Wt 77.1 kg (170 lb)   SpO2 100%   BMI 26.63 kg/m  Ht:170.2 cm (5' 7) Wt:77.1 kg (170 lb) ADJ:Anib surface area is 1.91 meters squared. Body mass index is 26.63 kg/m.  HEENT: Pupils equally reactive to light and accomodation  Neck: Supple without thyromegaly, carotid pulses 2+ Lungs: clear to auscultation bilaterally; no wheezes, rales, rhonchi Heart: Regular rate and rhythm.  No gallops, murmurs or rub Abdomen: soft nontender, nondistended, with normal bowel sounds Extremities: no cyanosis, clubbing, or edema Peripheral Pulses: 2+ in all extremities, 2+ femoral pulses bilaterally Neurologic: Alert and oriented X3; speech intact; face symmetrical; moves all extremities well   Cardiovascular Studies:    Echocardiogram 2D complete: 05/08/2024 IMPRESSIONS   1. Left ventricular ejection fraction, by estimation, is 35 to 40%. The left ventricle has moderately decreased function.   2. Right ventricular systolic function is mildly reduced. The right ventricular size is normal.   3. Left atrial size was dilated. No left atrial/left atrial appendage thrombus was detected.   4. Right atrial size was dilated.   5. The mitral valve is normal in structure. Moderate mitral valve regurgitation.   6. Tricuspid valve regurgitation is moderate.   7. The aortic valve is tricuspid. Aortic valve regurgitation is mild.    NM Myocardial Perfusion SPECT multiple (stress and rest):  Cardiac Catheterization:   Holter:  Cardiac CT Scan:  Cardiac MRI:    Assessment   83 y.o. female with  1. Chronic cough   2. Persistent atrial  fibrillation (CMS/HHS-HCC)   3. Essential hypertension   4. Heart murmur, systolic   5. Chronic heart failure with preserved ejection fraction (HFpEF) (CMS/HHS-HCC)   6. Mild aortic regurgitation   7. Edema of lower extremity   8. Depression, unspecified depression type   9. Iron deficiency anemia, unspecified iron deficiency anemia type   10. Fatigue, unspecified type    Plan   Chronic cough, refer to pulmonary for further assessment consider inhalers for anticough medications Paroxysmal atrial fibrillation continue anticoagulation with Eliquis  for stroke prevention and metoprolol  for rate  Hypertension, today's BP was 122/72, reasonably controlled continue amlodipine , metoprolol   Edema, start lasix , recommend support stockings, BLE elevation, and reducing sodium intake  Return in about 3 weeks (around 06/11/2024).  This note is partially written by Leita Ellen, in the presence of and acting as the scribe of Dr. Cara Lovelace.  Leita Ellen  I have reviewed, edited and added to the note to reflect my best personal medical judgment.  Attestation Statement:   I personally performed the service. (TP)  DWAYNE JONETTA LOVELACE, MD  [facility] [place], [place] Ph:  [phone] Fax:  [phone] This note was generated in part with voice recognition software, Dragon.  I apologize for any typographical errors that were not detected and corrected from this process.  They are unintentional.

## 2024-05-19 NOTE — Telephone Encounter (Signed)
 Have sent prescription for doxycycline.

## 2024-05-19 NOTE — Telephone Encounter (Signed)
 FYI Only or Action Required?: this afternoon  Patient was last seen in primary care on 05/14/2024 by Gasper Nancyann BRAVO, MD.  Called Nurse Triage reporting Cough and fatigue - chills  Symptoms began several weeks ago.  Interventions attempted: Other: seen in office, given antibiotic and cough medication.  Symptoms are: gradually worsening.  Triage Disposition: No disposition on file.  Patient/caregiver understands and will follow disposition?: yes                         Copied from CRM #[id number]. Topic: Clinical - Medication Question >> May 19, 2024  9:55 AM Myrick T wrote: Reason for CRM: patients daughter Ellouise called stated patient is on her last day of antibiotic, she is not feeling any better and has very little energy. She is also still coughing Reason for Disposition  [1] MILD difficulty breathing (e.g., minimal/no SOB at rest, SOB with walking, pulse < 100) AND [2] still present when not coughing  (Exception: No change from usual, chronic shortness of breath.)  Answer Assessment - Initial Assessment Questions 1. ONSET: When did the cough begin?      A day or 2 after procedure 2. SEVERITY: How bad is the cough today?      moderate 3. SPUTUM: Describe the color of your sputum (e.g., none, dry cough; clear, white, yellow, green)     no 4. HEMOPTYSIS: Are you coughing up any blood? If so ask: How much? (e.g., flecks, streaks, tablespoons, etc.)     no 5. DIFFICULTY BREATHING: Are you having difficulty breathing? If Yes, ask: How bad is it? (e.g., mild, moderate, severe)      With coughing 6. FEVER: Do you have a fever? If Yes, ask: What is your temperature, how was it measured, and when did it start?     unsure 7. CARDIAC HISTORY: Do you have any history of heart disease? (e.g., heart attack, congestive heart failure)      A-fib 8. LUNG HISTORY: Do you have any history of lung disease?  (e.g., pulmonary embolus, asthma, emphysema)      no 10. OTHER SYMPTOMS: Do you have any other symptoms? (e.g., runny nose, wheezing, chest pain)       chills  Protocols used: Cough - Chronic-A-AH

## 2024-05-20 NOTE — Telephone Encounter (Signed)
 Patient advised.

## 2024-05-28 ENCOUNTER — Ambulatory Visit: Admitting: Anesthesiology

## 2024-05-28 ENCOUNTER — Ambulatory Visit
Admission: RE | Admit: 2024-05-28 | Discharge: 2024-05-28 | Disposition: A | Discharge status: Home / Self Care | Attending: Internal Medicine | Admitting: Internal Medicine

## 2024-05-28 ENCOUNTER — Ambulatory Visit

## 2024-05-28 ENCOUNTER — Other Ambulatory Visit: Payer: Self-pay

## 2024-05-28 ENCOUNTER — Encounter: Payer: Self-pay | Admitting: Internal Medicine

## 2024-05-28 ENCOUNTER — Encounter
Admission: RE | Disposition: A | Discharge status: Home / Self Care | Payer: Self-pay | Source: Home / Self Care | Attending: Internal Medicine

## 2024-05-28 DIAGNOSIS — R011 Cardiac murmur, unspecified: Secondary | ICD-10-CM | POA: Diagnosis not present

## 2024-05-28 DIAGNOSIS — F32A Depression, unspecified: Secondary | ICD-10-CM | POA: Diagnosis not present

## 2024-05-28 DIAGNOSIS — D509 Iron deficiency anemia, unspecified: Secondary | ICD-10-CM | POA: Diagnosis not present

## 2024-05-28 DIAGNOSIS — I5032 Chronic diastolic (congestive) heart failure: Secondary | ICD-10-CM | POA: Diagnosis not present

## 2024-05-28 DIAGNOSIS — R5383 Other fatigue: Secondary | ICD-10-CM | POA: Insufficient documentation

## 2024-05-28 DIAGNOSIS — R053 Chronic cough: Secondary | ICD-10-CM | POA: Insufficient documentation

## 2024-05-28 DIAGNOSIS — I4819 Other persistent atrial fibrillation: Secondary | ICD-10-CM | POA: Insufficient documentation

## 2024-05-28 DIAGNOSIS — Z79899 Other long term (current) drug therapy: Secondary | ICD-10-CM | POA: Insufficient documentation

## 2024-05-28 DIAGNOSIS — I351 Nonrheumatic aortic (valve) insufficiency: Secondary | ICD-10-CM | POA: Diagnosis not present

## 2024-05-28 DIAGNOSIS — I4891 Unspecified atrial fibrillation: Secondary | ICD-10-CM | POA: Diagnosis present

## 2024-05-28 DIAGNOSIS — I110 Hypertensive heart disease with heart failure: Secondary | ICD-10-CM | POA: Insufficient documentation

## 2024-05-28 DIAGNOSIS — Z7901 Long term (current) use of anticoagulants: Secondary | ICD-10-CM | POA: Diagnosis not present

## 2024-05-28 DIAGNOSIS — Z01810 Encounter for preprocedural cardiovascular examination: Secondary | ICD-10-CM | POA: Diagnosis not present

## 2024-05-28 HISTORY — PX: CARDIOVERSION: SHX1299

## 2024-05-28 SURGERY — CARDIOVERSION
Anesthesia: General

## 2024-05-28 MED ORDER — PROPOFOL 10 MG/ML IV BOLUS
INTRAVENOUS | Status: DC | PRN
  Administered 2024-05-28 (×2): 20 mg via INTRAVENOUS
  Administered 2024-05-28: 40 mg via INTRAVENOUS

## 2024-05-28 MED ORDER — PROPOFOL 10 MG/ML IV BOLUS
INTRAVENOUS | Status: AC
  Filled 2024-05-28: qty 60

## 2024-05-28 MED ORDER — SODIUM CHLORIDE 0.9 % IV SOLN: INTRAVENOUS | Status: DC

## 2024-05-28 NOTE — CV Procedure (Signed)
 Direct current cardioversion 05/28/2024 12:47 PM  Indication symptomatic A. Fibrillation.  Procedure: Using IV Propofol  and IV Lidocaine  (for reducing venous pain) for achieving deep sedation, synchronized direct current cardioversion performed. Patient was delivered with 125 Joules of electricity X 1 with success to NSR. Patient tolerated the procedure well. No immediate complication noted.   Patient stable for discharge home.  Lynn Reever, DO 12:47 PM 05/28/24

## 2024-05-28 NOTE — Transfer of Care (Signed)
 Immediate Anesthesia Transfer of Care Note  Patient: Lynn Mccoy  Procedure(s) Performed: CARDIOVERSION  Patient Location: [facility]  Anesthesia Type:General  Level of Consciousness: oriented and patient cooperative  Airway & Oxygen Therapy: Patient Spontanous Breathing  Post-op Assessment: Report given to RN and Post -op Vital signs reviewed and stable  Post vital signs: Reviewed and stable  Last Vitals:  Vitals Value Taken Time  BP 120/58 05/28/24 12:45  Temp    Pulse 51 05/28/24 12:45  Resp 20 05/28/24 12:45  SpO2 93 % 05/28/24 12:45    Last Pain:  Vitals:   05/28/24 1118  TempSrc: Oral  PainSc: 0-No pain         Complications: No notable events documented.

## 2024-05-28 NOTE — Anesthesia Postprocedure Evaluation (Signed)
 Anesthesia Post Note  Patient: Lynn Mccoy  Procedure(s) Performed: CARDIOVERSION  Patient location during evaluation: PACU Anesthesia Type: General Level of consciousness: awake and alert Pain management: pain level controlled Vital Signs Assessment: post-procedure vital signs reviewed and stable Respiratory status: spontaneous breathing, nonlabored ventilation and respiratory function stable Cardiovascular status: blood pressure returned to baseline and stable Postop Assessment: no apparent nausea or vomiting Anesthetic complications: no   No notable events documented.   Last Vitals:  Vitals:   05/28/24 1300 05/28/24 1315  BP: 114/75 (!) 128/59  Pulse: (!) 53 (!) 57  Resp: 20 15  Temp:    SpO2: 93% 94%    Last Pain:  Vitals:   05/28/24 1315  TempSrc:   PainSc: 0-No pain                 Camellia Merilee Louder

## 2024-05-28 NOTE — Anesthesia Preprocedure Evaluation (Addendum)
 Anesthesia Evaluation  Patient identified by MRN, date of birth, ID band Patient awake    Reviewed: Allergy & Precautions, H&P , NPO status , Patient's Chart, lab work & pertinent test results, reviewed documented beta blocker date and time   Airway Mallampati: II   Neck ROM: full    Dental  (+) Lower Dentures, Upper Dentures   Pulmonary neg pulmonary ROS   Pulmonary exam normal        Cardiovascular Exercise Tolerance: Poor hypertension, On Medications +CHF  (-) Orthopnea, (-) PND and (-) DOE Atrial Fibrillation + Valvular Problems/Murmurs  Rhythm:Irregular Rate:Normal  TEE: 11/25:  1. Left ventricular ejection fraction, by estimation, is 35 to 40%. The  left ventricle has moderately decreased function.   2. Right ventricular systolic function is mildly reduced. The right  ventricular size is normal.   3. Left atrial size was dilated. No left atrial/left atrial appendage  thrombus was detected.   4. Right atrial size was dilated.   5. The mitral valve is normal in structure. Moderate mitral valve  regurgitation.   6. Tricuspid valve regurgitation is moderate.   7. The aortic valve is tricuspid. Aortic valve regurgitation is mild.     Neuro/Psych  PSYCHIATRIC DISORDERS  Depression    negative neurological ROS     GI/Hepatic negative GI ROS, Neg liver ROS,,,  Endo/Other  negative endocrine ROS    Renal/GU negative Renal ROS  negative genitourinary   Musculoskeletal   Abdominal Normal abdominal exam  (+)   Peds  Hematology  (+) Blood dyscrasia, anemia   Anesthesia Other Findings Past Medical History: No date: Anemia No date: Cancer [facility])     Comment:  ovarian ca No date: Depression 12/29/2014: History of cervical cancer     Comment:  stage 4, s/p complete hysterectomy  No date: History of mumps as a child No date: Hyperlipidemia No date: Hypertension No date: Osteoarthrosis     Comment:  knees No date:  Osteopenia No date: Wears dentures     Comment:  full upper and lower Past Surgical History: 1977: ABDOMINAL HYSTERECTOMY     Comment:  due to cervical cancer stage 4 1977: APPENDECTOMY No date: arthritis; Bilateral 03/06/2018: CATARACT EXTRACTION W/PHACO; Left     Comment:  Procedure: CATARACT EXTRACTION PHACO AND INTRAOCULAR               LENS PLACEMENT (IOC) COMPLICATED LEFT;  Surgeon:               Mittie Gaskin, MD;  Location: [facility];              Service: Ophthalmology;  Laterality: Left;  HEALON 5               VISION BLUE CAPSULAR TENSION RING MALYUGIN No date: COLONOSCOPY 11/14/2021: KNEE ARTHROPLASTY; Left     Comment:  Procedure: COMPUTER ASSISTED TOTAL KNEE ARTHROPLASTY;                Surgeon: Mardee Lynwood SQUIBB, MD;  Location: [facility];                Service: Orthopedics;  Laterality: Left; No date: OOPHORECTOMY No date: REPLACEMENT TOTAL KNEE; Right about 2006: ROTATOR CUFF REPAIR; Right     Comment:  Dr. Cleotilde   Reproductive/Obstetrics negative OB ROS                              Anesthesia  Physical Anesthesia Plan  ASA: 3  Anesthesia Plan: General   Post-op Pain Management: Minimal or no pain anticipated   Induction: Intravenous  PONV Risk Score and Plan:   Airway Management Planned: Natural Airway  Additional Equipment:   Intra-op Plan:   Post-operative Plan:   Informed Consent: I have reviewed the patients History and Physical, chart, labs and discussed the procedure including the risks, benefits and alternatives for the proposed anesthesia with the patient or authorized representative who has indicated his/her understanding and acceptance.     Dental Advisory Given  Plan Discussed with: CRNA  Anesthesia Plan Comments:          Anesthesia Quick Evaluation

## 2024-05-30 ENCOUNTER — Encounter: Payer: Self-pay | Admitting: Internal Medicine

## 2024-06-02 ENCOUNTER — Other Ambulatory Visit: Payer: Self-pay | Admitting: Family Medicine

## 2024-06-02 DIAGNOSIS — G4700 Insomnia, unspecified: Secondary | ICD-10-CM

## 2024-06-04 ENCOUNTER — Other Ambulatory Visit (HOSPITAL_BASED_OUTPATIENT_CLINIC_OR_DEPARTMENT_OTHER): Payer: Self-pay

## 2024-06-17 ENCOUNTER — Ambulatory Visit: Payer: Self-pay | Admitting: *Deleted

## 2024-06-17 NOTE — Telephone Encounter (Signed)
 Call disconnected and called patient back . No available appt today with any provider. Recommended UC.       FYI Only or Action Required?: FYI only for provider: recommended UC or ED .  Patient was last seen in primary care on 05/14/2024 by Gasper Nancyann BRAVO, MD.  Called Nurse Triage reporting Facial Swelling.  Symptoms began several days ago. Saturday   Interventions attempted: Nothing.  Symptoms are: gradually worsening.  Triage Disposition: See HCP Within 4 Hours (Or PCP Triage)  Patient/caregiver understands and will follow disposition?: Yes                 Copied from CRM #[id number]. Topic: Clinical - Red Word Triage >> Jun 17, 2024  8:31 AM Roselie BROCKS wrote: [facility] that prompted transfer to Nurse Triage: Patient states her face is very swollen and she has high fever and pain. >> Jun 17, 2024  8:55 AM Mia F wrote: Patient calling back to get an appt. Warm transfer to nurse due to red words  >> Jun 17, 2024  8:51 AM Roselie BROCKS wrote: Patient disconnected while on hold, did not answer on call back , was  holding for her face bing  very swollen and she has high fever and pain. Reason for Disposition  SEVERE face swelling (e.g., entire face)    Swelling right eyelid , cheeks and bottom lip  Answer Assessment - Initial Assessment Questions No available appt today as recommended. Recommended UC / ED for evaluation. Patient reports she will call daughter to take her.         1. ONSET: When did the swelling start? (e.g., minutes, hours, days)     Yesterday  2. LOCATION: What part of the face is swollen? (e.g., cheek, entire face, jaw joint area, under jaw)     Cheeks and blottom lip over right eyelid  3. SEVERITY: How swollen is it?     In morning very swollen but going down now  4. ITCHING: Is there any itching? If Yes, ask: How much?   (Scale 1-10; mild, moderate or severe)     Yes  5. PAIN: Is the swelling painful to touch? If Yes, ask:  How painful is it?   (Scale 0-10; mild, moderate or severe)     na 6. FEVER: Do you have a fever? If Yes, ask: What is it, how was it measured, and when did it start?      no 7. CAUSE: What do you think is causing the face swelling?     Not sure  8. NEW MEDICINES: Have there been any new medicines started recently?     Reports she is seeing multiple dr and unsure.  9. RECURRENT SYMPTOM: Have you had face swelling before? If Yes, ask: When was the last time? What happened that time?     Yes poison oak  10. OTHER SYMPTOMS: Do you have any other symptoms? (e.g., leg swelling, toothache)       Facial swelling, red cheeks and hot to touch. Itching no pain , reports handling brush doing yard work on Saturday . Reports one area on face that in brown spots maybe something bit me. Denies chest pain no difficulty breathing. No fever reported cheeks hot to touch. No tongue swelling  11. PREGNANCY: Is there any chance you are pregnant? When was your last menstrual period?       na  Protocols used: Face Swelling-A-AH

## 2024-07-08 ENCOUNTER — Other Ambulatory Visit: Payer: Self-pay | Admitting: Family Medicine

## 2024-07-16 ENCOUNTER — Ambulatory Visit
Admission: RE | Admit: 2024-07-16 | Discharge: 2024-07-16 | Disposition: A | Discharge status: Home / Self Care | Attending: Internal Medicine | Admitting: Internal Medicine

## 2024-07-16 ENCOUNTER — Other Ambulatory Visit: Payer: Self-pay

## 2024-07-16 ENCOUNTER — Encounter: Admission: RE | Disposition: A | Payer: Self-pay | Source: Home / Self Care | Attending: Internal Medicine

## 2024-07-16 ENCOUNTER — Ambulatory Visit: Admitting: Certified Registered Nurse Anesthetist

## 2024-07-16 ENCOUNTER — Encounter: Payer: Self-pay | Admitting: Internal Medicine

## 2024-07-16 DIAGNOSIS — I5032 Chronic diastolic (congestive) heart failure: Secondary | ICD-10-CM | POA: Diagnosis not present

## 2024-07-16 DIAGNOSIS — I4819 Other persistent atrial fibrillation: Secondary | ICD-10-CM | POA: Insufficient documentation

## 2024-07-16 DIAGNOSIS — R609 Edema, unspecified: Secondary | ICD-10-CM | POA: Insufficient documentation

## 2024-07-16 DIAGNOSIS — I110 Hypertensive heart disease with heart failure: Secondary | ICD-10-CM | POA: Insufficient documentation

## 2024-07-16 DIAGNOSIS — I351 Nonrheumatic aortic (valve) insufficiency: Secondary | ICD-10-CM | POA: Insufficient documentation

## 2024-07-16 DIAGNOSIS — R5383 Other fatigue: Secondary | ICD-10-CM | POA: Insufficient documentation

## 2024-07-16 DIAGNOSIS — I4891 Unspecified atrial fibrillation: Secondary | ICD-10-CM | POA: Diagnosis present

## 2024-07-16 DIAGNOSIS — F4321 Adjustment disorder with depressed mood: Secondary | ICD-10-CM | POA: Insufficient documentation

## 2024-07-16 DIAGNOSIS — G4700 Insomnia, unspecified: Secondary | ICD-10-CM | POA: Insufficient documentation

## 2024-07-16 DIAGNOSIS — Z79899 Other long term (current) drug therapy: Secondary | ICD-10-CM | POA: Diagnosis not present

## 2024-07-16 DIAGNOSIS — Z96653 Presence of artificial knee joint, bilateral: Secondary | ICD-10-CM | POA: Insufficient documentation

## 2024-07-16 DIAGNOSIS — M1990 Unspecified osteoarthritis, unspecified site: Secondary | ICD-10-CM | POA: Diagnosis not present

## 2024-07-16 DIAGNOSIS — D509 Iron deficiency anemia, unspecified: Secondary | ICD-10-CM | POA: Diagnosis not present

## 2024-07-16 HISTORY — PX: CARDIOVERSION: SHX1299

## 2024-07-16 MED ORDER — METOPROLOL SUCCINATE ER 25 MG PO TB24: 25.0000 mg | ORAL_TABLET | Freq: Two times a day (BID) | ORAL | 0 refills | Status: AC

## 2024-07-16 MED ORDER — PROPOFOL 10 MG/ML IV BOLUS
INTRAVENOUS | Status: AC
  Filled 2024-07-16: qty 20

## 2024-07-16 MED ORDER — PROPOFOL 10 MG/ML IV BOLUS
INTRAVENOUS | Status: DC | PRN
  Administered 2024-07-16: 10 mg via INTRAVENOUS
  Administered 2024-07-16: 40 mg via INTRAVENOUS

## 2024-07-16 MED ORDER — SODIUM CHLORIDE 0.9 % IV SOLN: INTRAVENOUS | Status: DC

## 2024-07-16 NOTE — Anesthesia Procedure Notes (Signed)
 Date/Time: 07/16/2024 7:39 AM  Performed by: Dominica Krabbe, CRNAPre-anesthesia Checklist: Patient identified, Emergency Drugs available, Suction available, Patient being monitored and Timeout performed Patient Re-evaluated:Patient Re-evaluated prior to induction Oxygen Delivery Method: Nasal cannula Preoxygenation: Pre-oxygenation with 100% oxygen Induction Type: IV induction

## 2024-07-16 NOTE — CV Procedure (Signed)
 Electrical Cardioversion Procedure Note   Procedure: Electrical Cardioversion Indications:  Atrial Fibrillation  Procedure Details Consent: Risks of procedure as well as the alternatives and risks of each were explained to the (patient/caregiver).  Consent for procedure obtained. Time Out: Verified patient identification, verified procedure, site/side was marked, verified correct patient position, special equipment/implants available, medications/allergies/relevent history reviewed, required imaging and test results available.  Performed  Patient placed on cardiac monitor, pulse oximetry, supplemental oxygen as necessary.  Sedation given: Propofol  as per anesthesia Pacer pads placed anterior and posterior chest.  Cardioverted 1 time(s).  Cardioverted at 200J.  Evaluation Findings: Post procedure EKG shows: NSR Complications: None Patient did tolerate procedure well.   Cara Lovelace MD 07/16/24 0739am

## 2024-07-16 NOTE — Transfer of Care (Signed)
 Immediate Anesthesia Transfer of Care Note  Patient: Lynn Mccoy  Procedure(s) Performed: CARDIOVERSION  Patient Location: [facility]  Anesthesia Type:General  Level of Consciousness: awake, alert , and patient cooperative  Airway & Oxygen Therapy: Patient Spontanous Breathing and Patient connected to nasal cannula oxygen  Post-op Assessment: Report given to RN and Post -op Vital signs reviewed and stable  Post vital signs: Reviewed and stable  Last Vitals:  Vitals Value Taken Time  BP 97/46 07/16/24 07:49  Temp    Pulse 46 07/16/24 07:49  Resp 16 07/16/24 07:49  SpO2 95 % 07/16/24 07:49    Last Pain:  Vitals:   07/16/24 0657  TempSrc: Temporal  PainSc: 0-No pain         Complications: No notable events documented.

## 2024-07-17 ENCOUNTER — Encounter: Payer: Self-pay | Admitting: Internal Medicine

## 2024-07-17 NOTE — Anesthesia Postprocedure Evaluation (Signed)
"   Anesthesia Post Note  Patient: Lynn Mccoy  Procedure(s) Performed: CARDIOVERSION  Patient location during evaluation: [facility] Anesthesia Type: General Level of consciousness: awake and alert Pain management: pain level controlled Vital Signs Assessment: post-procedure vital signs reviewed and stable Respiratory status: spontaneous breathing, nonlabored ventilation, respiratory function stable and patient connected to nasal cannula oxygen Cardiovascular status: blood pressure returned to baseline and stable Postop Assessment: no apparent nausea or vomiting Anesthetic complications: no   No notable events documented.   Last Vitals:  Vitals:   07/16/24 0815 07/16/24 0830  BP: (!) 102/56 (!) 114/51  Pulse: (!) 48 (!) 48  Resp: 17 19  Temp:  (!) 36.1 C  SpO2: 93% 94%    Last Pain:  Vitals:   07/16/24 0830  TempSrc: Temporal  PainSc: 0-No pain                 Prentice Murphy      "

## 2024-07-17 NOTE — Anesthesia Preprocedure Evaluation (Signed)
 Anesthesia Evaluation  Patient identified by MRN, date of birth, ID band Patient awake    Reviewed: Allergy & Precautions, H&P , NPO status , Patient's Chart, lab work & pertinent test results, reviewed documented beta blocker date and time   Airway Mallampati: II   Neck ROM: full    Dental  (+) Lower Dentures, Upper Dentures   Pulmonary neg pulmonary ROS   Pulmonary exam normal        Cardiovascular Exercise Tolerance: Poor hypertension, On Medications +CHF  (-) Orthopnea, (-) PND and (-) DOE Atrial Fibrillation + Valvular Problems/Murmurs  Rhythm:Irregular Rate:Normal  TEE: 11/25:  1. Left ventricular ejection fraction, by estimation, is 35 to 40%. The  left ventricle has moderately decreased function.   2. Right ventricular systolic function is mildly reduced. The right  ventricular size is normal.   3. Left atrial size was dilated. No left atrial/left atrial appendage  thrombus was detected.   4. Right atrial size was dilated.   5. The mitral valve is normal in structure. Moderate mitral valve  regurgitation.   6. Tricuspid valve regurgitation is moderate.   7. The aortic valve is tricuspid. Aortic valve regurgitation is mild.     Neuro/Psych  PSYCHIATRIC DISORDERS  Depression    negative neurological ROS     GI/Hepatic negative GI ROS, Neg liver ROS,,,  Endo/Other  negative endocrine ROS    Renal/GU negative Renal ROS  negative genitourinary   Musculoskeletal   Abdominal Normal abdominal exam  (+)   Peds  Hematology  (+) Blood dyscrasia, anemia   Anesthesia Other Findings Past Medical History: No date: Anemia No date: Cancer [facility])     Comment:  ovarian ca No date: Depression 12/29/2014: History of cervical cancer     Comment:  stage 4, s/p complete hysterectomy  No date: History of mumps as a child No date: Hyperlipidemia No date: Hypertension No date: Osteoarthrosis     Comment:  knees No date:  Osteopenia No date: Wears dentures     Comment:  full upper and lower Past Surgical History: 1977: ABDOMINAL HYSTERECTOMY     Comment:  due to cervical cancer stage 4 1977: APPENDECTOMY No date: arthritis; Bilateral 03/06/2018: CATARACT EXTRACTION W/PHACO; Left     Comment:  Procedure: CATARACT EXTRACTION PHACO AND INTRAOCULAR               LENS PLACEMENT (IOC) COMPLICATED LEFT;  Surgeon:               Mittie Gaskin, MD;  Location: [facility];              Service: Ophthalmology;  Laterality: Left;  HEALON 5               VISION BLUE CAPSULAR TENSION RING MALYUGIN No date: COLONOSCOPY 11/14/2021: KNEE ARTHROPLASTY; Left     Comment:  Procedure: COMPUTER ASSISTED TOTAL KNEE ARTHROPLASTY;                Surgeon: Mardee Lynwood SQUIBB, MD;  Location: [facility];                Service: Orthopedics;  Laterality: Left; No date: OOPHORECTOMY No date: REPLACEMENT TOTAL KNEE; Right about 2006: ROTATOR CUFF REPAIR; Right     Comment:  Dr. Cleotilde   Reproductive/Obstetrics negative OB ROS                              Anesthesia  Physical Anesthesia Plan  ASA: 3  Anesthesia Plan: General   Post-op Pain Management: Minimal or no pain anticipated   Induction: Intravenous  PONV Risk Score and Plan:   Airway Management Planned: Natural Airway  Additional Equipment:   Intra-op Plan:   Post-operative Plan:   Informed Consent: I have reviewed the patients History and Physical, chart, labs and discussed the procedure including the risks, benefits and alternatives for the proposed anesthesia with the patient or authorized representative who has indicated his/her understanding and acceptance.     Dental Advisory Given  Plan Discussed with: CRNA  Anesthesia Plan Comments:          Anesthesia Quick Evaluation

## 2025-05-18 ENCOUNTER — Encounter: Admitting: Family Medicine
